# Patient Record
Sex: Male | Born: 1935 | Race: White | Hispanic: No
Health system: Southern US, Community
[De-identification: ages and names within clinical notes are randomized; demographics above are authoritative.]

## PROBLEM LIST (undated history)

## (undated) DIAGNOSIS — I10 Essential (primary) hypertension: Secondary | ICD-10-CM

## (undated) DIAGNOSIS — N200 Calculus of kidney: Secondary | ICD-10-CM

## (undated) DIAGNOSIS — D649 Anemia, unspecified: Secondary | ICD-10-CM

## (undated) DIAGNOSIS — I059 Rheumatic mitral valve disease, unspecified: Secondary | ICD-10-CM

## (undated) DIAGNOSIS — E559 Vitamin D deficiency, unspecified: Secondary | ICD-10-CM

## (undated) DIAGNOSIS — I4891 Unspecified atrial fibrillation: Secondary | ICD-10-CM

## (undated) DIAGNOSIS — N400 Enlarged prostate without lower urinary tract symptoms: Secondary | ICD-10-CM

## (undated) DIAGNOSIS — I493 Ventricular premature depolarization: Secondary | ICD-10-CM

## (undated) DIAGNOSIS — Z7901 Long term (current) use of anticoagulants: Secondary | ICD-10-CM

## (undated) DIAGNOSIS — N184 Chronic kidney disease, stage 4 (severe): Secondary | ICD-10-CM

## (undated) DIAGNOSIS — M1990 Unspecified osteoarthritis, unspecified site: Secondary | ICD-10-CM

## (undated) DIAGNOSIS — C61 Malignant neoplasm of prostate: Secondary | ICD-10-CM

## (undated) DIAGNOSIS — Z9229 Personal history of other drug therapy: Secondary | ICD-10-CM

## (undated) HISTORY — PX: EYE SURGERY: SHX253

## (undated) HISTORY — PX: KIDNEY STONE SURGERY: SHX686

## (undated) HISTORY — PX: SP AV DIALYSIS SHUNT ACCESS EXISTING *L*: HXRAD383

---

## 1998-01-09 ENCOUNTER — Other Ambulatory Visit: Admission: RE | Admit: 1998-01-09 | Discharge: 1998-01-09 | Payer: Self-pay | Admitting: *Deleted

## 2010-02-15 ENCOUNTER — Ambulatory Visit (HOSPITAL_COMMUNITY): Admission: RE | Admit: 2010-02-15 | Discharge: 2010-02-15 | Payer: Self-pay | Admitting: Urology

## 2010-07-28 HISTORY — PX: JOINT REPLACEMENT: SHX530

## 2011-03-31 ENCOUNTER — Other Ambulatory Visit: Payer: Self-pay

## 2011-03-31 ENCOUNTER — Emergency Department (INDEPENDENT_AMBULATORY_CARE_PROVIDER_SITE_OTHER): Payer: Medicare Other

## 2011-03-31 ENCOUNTER — Emergency Department (HOSPITAL_BASED_OUTPATIENT_CLINIC_OR_DEPARTMENT_OTHER)
Admission: EM | Admit: 2011-03-31 | Discharge: 2011-03-31 | Disposition: A | Discharge status: Nursing Home (Includes ICF) | Payer: Medicare Other | Source: Home / Self Care | Attending: Emergency Medicine | Admitting: Emergency Medicine

## 2011-03-31 ENCOUNTER — Inpatient Hospital Stay (HOSPITAL_COMMUNITY): Payer: Medicare Other

## 2011-03-31 ENCOUNTER — Encounter: Payer: Self-pay | Admitting: *Deleted

## 2011-03-31 ENCOUNTER — Inpatient Hospital Stay (HOSPITAL_COMMUNITY)
Admission: AD | Admit: 2011-03-31 | Discharge: 2011-04-04 | DRG: 309 | Disposition: A | Discharge status: Home / Self Care | Payer: Medicare Other | Source: Other Acute Inpatient Hospital | Attending: Internal Medicine | Admitting: Internal Medicine

## 2011-03-31 DIAGNOSIS — R079 Chest pain, unspecified: Secondary | ICD-10-CM

## 2011-03-31 DIAGNOSIS — Z8546 Personal history of malignant neoplasm of prostate: Secondary | ICD-10-CM

## 2011-03-31 DIAGNOSIS — F17200 Nicotine dependence, unspecified, uncomplicated: Secondary | ICD-10-CM | POA: Diagnosis present

## 2011-03-31 DIAGNOSIS — I129 Hypertensive chronic kidney disease with stage 1 through stage 4 chronic kidney disease, or unspecified chronic kidney disease: Secondary | ICD-10-CM | POA: Diagnosis present

## 2011-03-31 DIAGNOSIS — R5383 Other fatigue: Secondary | ICD-10-CM

## 2011-03-31 DIAGNOSIS — Z880 Allergy status to penicillin: Secondary | ICD-10-CM

## 2011-03-31 DIAGNOSIS — N183 Chronic kidney disease, stage 3 unspecified: Secondary | ICD-10-CM | POA: Diagnosis present

## 2011-03-31 DIAGNOSIS — I4891 Unspecified atrial fibrillation: Principal | ICD-10-CM | POA: Diagnosis present

## 2011-03-31 DIAGNOSIS — N179 Acute kidney failure, unspecified: Secondary | ICD-10-CM | POA: Diagnosis present

## 2011-03-31 DIAGNOSIS — E669 Obesity, unspecified: Secondary | ICD-10-CM | POA: Diagnosis present

## 2011-03-31 HISTORY — DX: Unspecified osteoarthritis, unspecified site: M19.90

## 2011-03-31 HISTORY — DX: Calculus of kidney: N20.0

## 2011-03-31 HISTORY — DX: Malignant neoplasm of prostate: C61

## 2011-03-31 HISTORY — DX: Ventricular premature depolarization: I49.3

## 2011-03-31 HISTORY — DX: Essential (primary) hypertension: I10

## 2011-03-31 LAB — CBC
HCT: 34.5 % — ABNORMAL LOW (ref 39.0–52.0)
MCH: 31.7 pg (ref 26.0–34.0)
MCHC: 33.6 g/dL (ref 30.0–36.0)
MCV: 94.3 fL (ref 78.0–100.0)
Platelets: 132 10*3/uL — ABNORMAL LOW (ref 150–400)
RBC: 3.66 MIL/uL — ABNORMAL LOW (ref 4.22–5.81)
RDW: 13.3 % (ref 11.5–15.5)
WBC: 7.6 10*3/uL (ref 4.0–10.5)

## 2011-03-31 LAB — COMPREHENSIVE METABOLIC PANEL
ALT: 7 U/L (ref 0–53)
AST: 14 U/L (ref 0–37)
Alkaline Phosphatase: 60 U/L (ref 39–117)
GFR calc Af Amer: 37 mL/min — ABNORMAL LOW (ref >60)
GFR calc non Af Amer: 30 mL/min — ABNORMAL LOW (ref >60)
Total Bilirubin: 0.3 mg/dL (ref 0.3–1.2)

## 2011-03-31 LAB — CARDIAC PANEL(CRET KIN+CKTOT+MB+TROPI)
CK, MB: 1.7 ng/mL (ref 0.3–4.0)
Relative Index: INVALID (ref 0.0–2.5)
Total CK: 49 U/L (ref 7–232)
Troponin I: <0.3 ng/mL (ref <0.30)

## 2011-03-31 LAB — DIFFERENTIAL
Basophils Absolute: 0 10*3/uL (ref 0.0–0.1)
Basophils Relative: 0 % (ref 0–1)
Eosinophils Absolute: 0.5 10*3/uL (ref 0.0–0.7)
Eosinophils Relative: 6 % — ABNORMAL HIGH (ref 0–5)
Lymphocytes Relative: 19 % (ref 12–46)
Monocytes Relative: 9 % (ref 3–12)
Neutro Abs: 5 10*3/uL (ref 1.7–7.7)

## 2011-03-31 LAB — PRO B NATRIURETIC PEPTIDE: Pro B Natriuretic peptide (BNP): 3398 pg/mL — ABNORMAL HIGH (ref 0–450)

## 2011-03-31 MED ORDER — DILTIAZEM HCL 100 MG IV SOLR
INTRAVENOUS | Status: AC
  Administered 2011-03-31: 100 mg
  Filled 2011-03-31: qty 100

## 2011-03-31 MED ORDER — DILTIAZEM HCL 50 MG/10ML IV SOLN
10.0000 mg | Freq: Once | INTRAVENOUS | Status: DC
  Filled 2011-03-31: qty 2

## 2011-03-31 MED ORDER — DILTIAZEM HCL 100 MG IV SOLR: 5.0000 mg/h | INTRAVENOUS | Status: DC

## 2011-03-31 NOTE — ED Notes (Signed)
EMS reports patient woke up this morning with generalized fatigue and weakness.  Called EMS states patient stated he has an irregular heartbeat.  ST 120.

## 2011-03-31 NOTE — ED Provider Notes (Addendum)
History     CSN: [id number] Arrival date & time: 03/31/2011  9:51 AM  Chief Complaint  Patient presents with  . Weakness   HPI Patient states that he was feeling his usual self this morning.  About a half an hour eating breakfast he began feeling generally weak. He states it is felt like his heart was fluttering when he checked his pulse. He states he took her blood pressure at home and it was low with a systolic blood pressure 70. He called 911 that point. He states that when they arrived his blood pressure was normal. He states he's been feeling improved since that time. He denies any chest pain or shortness of breath. Past Medical History  Diagnosis Date  . Irregular heartbeat   . Gout   . Hypertension   . Prostate ca   . Osteoarthritis     No past surgical history on file.  No family history on file.  History  Substance Use Topics  . Smoking status: Not on file  . Smokeless tobacco: Not on file  . Alcohol Use:       Review of Systems  All other systems reviewed and are negative.    PE  to well-developed well-nourished elderly male sitting in bed who appears pale but in no acute distress. Heart rate is 133 respiratory rate is 20 blood pressure is 113/72 and he is afebrile. HEENT normocephalic atraumatic pupils are equal round and reactive to light extraocular movements are intact external ears are normal with TMs intact bilaterally mouth mucous membranes are slightly dry oropharynx is clear neck reveals no adenopathy and no JVD lungs are clear to auscultation Heart is irregularly irregular abdomen is soft and nontender extremities show no acute abnormality neurologically she is alert and oriented x3.  Physical Exam  BP 113/78  Pulse 133  Temp(Src) 98.5 F (36.9 C) (Oral)  Resp 20  SpO2 98%  Physical Exam  ED Course  Procedures  MDM  Date: 03/31/2011  Rate: 112  Rhythm: atrial fibrillation  QRS Axis: normal  Intervals: normal  ST/T Wave abnormalities:  normal  Conduction Disutrbances:none  Narrative Interpretation:   Old EKG Reviewed: none available   Dg Chest Portable 1 View  03/31/2011  *RADIOLOGY REPORT*  Clinical Data: Chest pain and fatigue.  PORTABLE CHEST - 1 VIEW  Comparison: None.  Findings: Single view of the chest demonstrates clear lungs.  There may be a small amount of atelectasis at the left lung base. The aortic arch and descending thoracic aorta are prominent.  Trachea is midline.  Heart size is within normal limits.  IMPRESSION: No acute chest findings.  Prominent descending thoracic aorta.  Original Report Authenticated By: Richarda Overlie, M.D.  Results for orders placed during the hospital encounter of 03/31/11  CBC      Component Value Range   WBC 7.6  4.0 - 10.5 (K/uL)   RBC 3.66 (*) 4.22 - 5.81 (MIL/uL)   Hemoglobin 11.6 (*) 13.0 - 17.0 (g/dL)   HCT 40.9 (*) 81.1 - 52.0 (%)   MCV 94.3  78.0 - 100.0 (fL)   MCH 31.7  26.0 - 34.0 (pg)   MCHC 33.6  30.0 - 36.0 (g/dL)   RDW 91.4  78.2 - 95.6 (%)   Platelets 132 (*) 150 - 400 (K/uL)  DIFFERENTIAL      Component Value Range   Neutrophils Relative 65  43 - 77 (%)   Neutro Abs 5.0  1.7 - 7.7 (K/uL)   Lymphocytes  Relative 19  12 - 46 (%)   Lymphs Abs 1.5  0.7 - 4.0 (K/uL)   Monocytes Relative 9  3 - 12 (%)   Monocytes Absolute 0.7  0.1 - 1.0 (K/uL)   Eosinophils Relative 6 (*) 0 - 5 (%)   Eosinophils Absolute 0.5  0.0 - 0.7 (K/uL)   Basophils Relative 0  0 - 1 (%)   Basophils Absolute 0.0  0.0 - 0.1 (K/uL)  COMPREHENSIVE METABOLIC PANEL      Component Value Range   Sodium 141  135 - 145 (mEq/L)   Potassium 4.1  3.5 - 5.1 (mEq/L)   Chloride 104  96 - 112 (mEq/L)   CO2 28  19 - 32 (mEq/L)   Glucose, Bld 111 (*) 70 - 99 (mg/dL)   BUN 28 (*) 6 - 23 (mg/dL)   Creatinine, Ser 0.45 (*) 0.50 - 1.35 (mg/dL)   Calcium 40.9 (*) 8.4 - 10.5 (mg/dL)   Total Protein 6.3  6.0 - 8.3 (g/dL)   Albumin 3.6  3.5 - 5.2 (g/dL)   AST 14  0 - 37 (U/L)   ALT 7  0 - 53 (U/L)   Alkaline  Phosphatase 60  39 - 117 (U/L)   Total Bilirubin 0.3  0.3 - 1.2 (mg/dL)   GFR calc non Af Amer 30 (*) >60 (mL/min)   GFR calc Af Amer 37 (*) >60 (mL/min)  PROTIME-INR      Component Value Range   Prothrombin Time 13.7  11.6 - 15.2 (seconds)   INR 1.03  0.00 - 1.49   APTT      Component Value Range   aPTT 28  24 - 37 (seconds)  TROPONIN I      Component Value Range   Troponin I <0.30  <0.30 (ng/mL)   Patient's hospital course involved receiving Cardizem 10 mg IV and he is currently on a Cardizem drip at 5 mg per hour. His heart rate was controlled in the 90s. He has maintained adequate blood pressure.   I spoke with hosptitalist on call and patient will be transferred to their service with the doctor on call for the hospitalists   Hilario Quarry, MD 04/08/11 1116  Hilario Quarry, MD 04/12/11 8119  Hilario Quarry, MD 04/12/11 [phone]

## 2011-03-31 NOTE — ED Notes (Signed)
Attempted to call report to 1414 Carley Hammed, RN at [facility].  Unable to give report at this time.

## 2011-03-31 NOTE — ED Notes (Signed)
Patient states he had a sudden onset of weakness and not feeling good after breakfast.  Attempted to check bp and machine would not work.

## 2011-04-01 ENCOUNTER — Inpatient Hospital Stay (HOSPITAL_COMMUNITY): Payer: Medicare Other

## 2011-04-01 DIAGNOSIS — M79609 Pain in unspecified limb: Secondary | ICD-10-CM

## 2011-04-01 LAB — LIPID PANEL
Cholesterol: 178 mg/dL (ref 0–200)
Total CHOL/HDL Ratio: 4.6 RATIO
VLDL: 28 mg/dL (ref 0–40)

## 2011-04-01 LAB — MAGNESIUM: Magnesium: 1.4 mg/dL — ABNORMAL LOW (ref 1.5–2.5)

## 2011-04-01 LAB — CREATININE, URINE, RANDOM: Creatinine, Urine: 58.6 mg/dL

## 2011-04-01 LAB — PHOSPHORUS: Phosphorus: 3.2 mg/dL (ref 2.3–4.6)

## 2011-04-01 LAB — CBC
MCH: 31.8 pg (ref 26.0–34.0)
Platelets: 130 10*3/uL — ABNORMAL LOW (ref 150–400)
RBC: 3.52 MIL/uL — ABNORMAL LOW (ref 4.22–5.81)
RDW: 13.9 % (ref 11.5–15.5)
WBC: 6.4 10*3/uL (ref 4.0–10.5)

## 2011-04-01 LAB — OCCULT BLOOD X 1 CARD TO LAB, STOOL: Fecal Occult Bld: NEGATIVE

## 2011-04-01 LAB — HEPARIN LEVEL (UNFRACTIONATED)
Heparin Unfractionated: 0.59 IU/mL (ref 0.30–0.70)
Heparin Unfractionated: 0.73 IU/mL — ABNORMAL HIGH (ref 0.30–0.70)

## 2011-04-01 LAB — BASIC METABOLIC PANEL
BUN: 25 mg/dL — ABNORMAL HIGH (ref 6–23)
Calcium: 10.2 mg/dL (ref 8.4–10.5)
Chloride: 105 mEq/L (ref 96–112)
Glucose, Bld: 101 mg/dL — ABNORMAL HIGH (ref 70–99)
Potassium: 3.9 mEq/L (ref 3.5–5.1)

## 2011-04-01 LAB — CARDIAC PANEL(CRET KIN+CKTOT+MB+TROPI)
CK, MB: 1.7 ng/mL (ref 0.3–4.0)
Relative Index: INVALID (ref 0.0–2.5)
Total CK: 56 U/L (ref 7–232)

## 2011-04-01 LAB — IRON AND TIBC
Iron: 49 ug/dL (ref 42–135)
TIBC: 276 ug/dL (ref 215–435)
UIBC: 227 ug/dL (ref 125–400)

## 2011-04-01 LAB — TSH: TSH: 2.684 u[IU]/mL (ref 0.350–4.500)

## 2011-04-01 MED ORDER — TECHNETIUM TO 99M ALBUMIN AGGREGATED
5.5000 | Freq: Once | INTRAVENOUS | Status: AC | PRN
  Administered 2011-04-01: 6 via INTRAVENOUS

## 2011-04-01 MED ORDER — XENON XE 133 GAS
6.0000 | GAS_FOR_INHALATION | Freq: Once | RESPIRATORY_TRACT | Status: AC | PRN
  Administered 2011-04-01: 6 via RESPIRATORY_TRACT

## 2011-04-02 LAB — CBC
Hemoglobin: 10.7 g/dL — ABNORMAL LOW (ref 13.0–17.0)
MCHC: 33.1 g/dL (ref 30.0–36.0)
Platelets: 122 10*3/uL — ABNORMAL LOW (ref 150–400)
RDW: 13.6 % (ref 11.5–15.5)

## 2011-04-02 LAB — HEPARIN LEVEL (UNFRACTIONATED)
Heparin Unfractionated: 0.54 IU/mL (ref 0.30–0.70)
Heparin Unfractionated: 0.42 IU/mL (ref 0.30–0.70)

## 2011-04-02 LAB — BASIC METABOLIC PANEL
BUN: 23 mg/dL (ref 6–23)
Calcium: 10 mg/dL (ref 8.4–10.5)
Creatinine, Ser: 1.8 mg/dL — ABNORMAL HIGH (ref 0.50–1.35)
GFR calc Af Amer: 44 mL/min — ABNORMAL LOW (ref >60)
GFR calc non Af Amer: 36 mL/min — ABNORMAL LOW (ref >60)
Glucose, Bld: 93 mg/dL (ref 70–99)

## 2011-04-02 LAB — PROTIME-INR: Prothrombin Time: 16.3 seconds — ABNORMAL HIGH (ref 11.6–15.2)

## 2011-04-03 LAB — COMPREHENSIVE METABOLIC PANEL
Albumin: 3.5 g/dL (ref 3.5–5.2)
Alkaline Phosphatase: 56 U/L (ref 39–117)
BUN: 25 mg/dL — ABNORMAL HIGH (ref 6–23)
Potassium: 3.7 mEq/L (ref 3.5–5.1)
Sodium: 142 mEq/L (ref 135–145)
Total Protein: 6.2 g/dL (ref 6.0–8.3)

## 2011-04-03 LAB — DIFFERENTIAL
Basophils Absolute: 0 10*3/uL (ref 0.0–0.1)
Basophils Relative: 1 % (ref 0–1)
Eosinophils Absolute: 0.4 10*3/uL (ref 0.0–0.7)
Eosinophils Relative: 7 % — ABNORMAL HIGH (ref 0–5)
Monocytes Absolute: 0.5 10*3/uL (ref 0.1–1.0)

## 2011-04-03 LAB — PROTIME-INR: INR: 1.79 — ABNORMAL HIGH (ref 0.00–1.49)

## 2011-04-03 LAB — CBC
HCT: 33.5 % — ABNORMAL LOW (ref 39.0–52.0)
MCHC: 33.4 g/dL (ref 30.0–36.0)
Platelets: 131 10*3/uL — ABNORMAL LOW (ref 150–400)
RDW: 13.6 % (ref 11.5–15.5)

## 2011-04-03 LAB — APTT: aPTT: 90 seconds — ABNORMAL HIGH (ref 24–37)

## 2011-04-03 LAB — MAGNESIUM: Magnesium: 1.2 mg/dL — ABNORMAL LOW (ref 1.5–2.5)

## 2011-04-03 LAB — HEPARIN LEVEL (UNFRACTIONATED): Heparin Unfractionated: 0.68 IU/mL (ref 0.30–0.70)

## 2011-04-04 LAB — DIFFERENTIAL
Basophils Absolute: 0 10*3/uL (ref 0.0–0.1)
Eosinophils Absolute: 0.4 10*3/uL (ref 0.0–0.7)
Eosinophils Relative: 6 % — ABNORMAL HIGH (ref 0–5)
Lymphocytes Relative: 21 % (ref 12–46)

## 2011-04-04 LAB — PROTIME-INR
INR: 2.37 — ABNORMAL HIGH (ref 0.00–1.49)
Prothrombin Time: 26.3 seconds — ABNORMAL HIGH (ref 11.6–15.2)

## 2011-04-04 LAB — CBC
Platelets: 132 10*3/uL — ABNORMAL LOW (ref 150–400)
RDW: 13.7 % (ref 11.5–15.5)
WBC: 6.8 10*3/uL (ref 4.0–10.5)

## 2011-04-04 LAB — COMPREHENSIVE METABOLIC PANEL
Albumin: 3.5 g/dL (ref 3.5–5.2)
BUN: 25 mg/dL — ABNORMAL HIGH (ref 6–23)
Creatinine, Ser: 1.71 mg/dL — ABNORMAL HIGH (ref 0.50–1.35)
Total Protein: 6.3 g/dL (ref 6.0–8.3)

## 2011-04-04 LAB — APTT: aPTT: 152 seconds — ABNORMAL HIGH (ref 24–37)

## 2011-04-04 LAB — MAGNESIUM: Magnesium: 2 mg/dL (ref 1.5–2.5)

## 2011-04-04 NOTE — Discharge Summary (Signed)
NAME:  Steven [name], Steven [name] NO.:  [id number]  MEDICAL RECORD NO.:  [id number]  LOCATION:  1414                         FACILITY:  [facility]  PHYSICIAN:  Talmage Nap, MD  DATE OF BIRTH:  February 22, 1927  DATE OF ADMISSION:  03/31/2011 DATE OF DISCHARGE:  04/04/2011                        DISCHARGE SUMMARY - REFERRING   PRIMARY CARE PHYSICIAN:  Robert L. Foy Guadalajara, MD, with [facility] Sprang.  CONSULTANTS INVOLVED IN THE CASE:  Cardiology, Georga Hacking, MD.  DISCHARGE DIAGNOSES: 1. Atrial fibrillation status post cardioversion with a defibrillator. 2. Hypertension. 3. Hypomagnesemia - corrected. 4. Obesity. 5. History of gout. 6. Osteoarthritis. 7. History of prostate cancer. 8. Chronic kidney disease stage 3.  PROCEDURE DONE ON PATIENT:  An emergency cardioversion with defibrillator.  The patient is an 77 year old Caucasian male with history of prostate CA who was admitted to the hospital on March 31, 2011, by Dr. Richarda Overlie, with complaint of feeling very tired and weak.  The patient was also said to have noticed irregular heartbeat.  There was no history of loss of consciousness.  There was no history of chest pain.  There was no history of shortness of breath.  No nausea or vomiting.  No fever, no chills, no rigor.  Denies any lightheadedness or syncopal attack.  The patient, however, complained of on and off swelling of the lower extremity, but denied any associated shortness of breath.  At the time the patient presented to the emergency room, he was found to be in atrial fibrillation with rapid ventricular rate and subsequently started on IV Cardizem, and thereafter admitted for stabilization.  PREADMISSION MEDICATIONS WITHOUT DOSAGES: 1. Zestril. 2. Norvasc. 3. Allopurinol. 4. Aspirin. 5. Atenolol/hydrochlorothiazide. 6. Calcium carbonate. 7. Ibuprofen. 8. Hydroxyprogesterone. 9. Remeron.  ALLERGIES:  To PENICILLIN.  SOCIAL HISTORY:  The patient  smokes cigar all his life.  Denies any history of alcohol use; and lives at home with his spouse.  FAMILY HISTORY:  York Spaniel to be positive for gynecological malignancy, suspicities unknown.  Father was said to have died of acute MI at age of 65.  PAST SURGICAL HISTORY:  Surgery type unknown for nephrolithiasis.  REVIEW OF SYSTEMS:  Essentially documented in the initial history and physical.  PHYSICAL EXAMINATION:  VITAL SIGNS:  At the time the patient was seen by the admitting physician, blood pressure 150/77, respiratory rate 18, pulse 88, said to be saturating at 82% on room air. GENERAL:  He was comfortable, not in any respiratory distress. HEENT: Pupils were reactive to light, and extraocular muscles are intact. NECK:  No jugular venous distention.  No carotid bruit.  No lymphadenopathy. CHEST:  Said to have few crackles at the bases. HEART:  Heart sounds irregularly irregular with no murmur. ABDOMEN:  Obese, nontender.  Liver, spleen, kidney, not palpable.  Bowel sounds are positive. EXTREMITIES:  Showed trace edema. NEUROLOGIC:  Nonfocal. MUSCULOSKELETAL SYSTEM:  Showed arthritic changes in the knees and in the feet.  NEUROPSYCHIATRIC:  Evaluation was unremarkable.  LABORATORY DATA:  Initial complete blood count differential showed WBC of 7.6, hemoglobin 11.6, hematocrit 34.5, MCV of 94.3, and platelet count of 132.  Cardiac markers, troponin-I less than 0.30, less than 0.30, and less than  0.30 respectively.  Comprehensive metabolic panel showed sodium of 141, potassium of 4.1, chloride of 104 with a bicarb of 28, glucose is 111, BUN is 20, creatinine is 2.10.  Coagulation profile showed PT 13.7, INR 1.03, and APTT of 28.  ProBNP 3398.  Initial magnesium level done on March 31, 2011 was 1.4, phosphorus level is 3.2.  Hemoglobin A1c is 5.9.  D-dimer is 2.22.  Thyroid panel, TSH 2.684.  Anemia panel showed serum iron 49, total iron binding capacity is 276, percentage  saturation is 18, UIBC is 227.  Vitamin B12 is 237. Lipid panel showed total cholesterol 178, triglyceride 138, HDL 39, LDL 111.  Repeat proBNP done on April 02, 2011 was 2802.  Comprehensive metabolic panel done on April 04, 2011, showed sodium of 141, potassium of 3.9, chloride of 102 with a bicarb of 30.  Glucose is 101, BUN is 25, creatinine is 1.71.  Magnesium level is 2.0.  Complete blood count with differential showed WBC of 6.8, hemoglobin of 12.1, hematocrit of 35.3, MCV of 93.1, platelet count of 132.  Imaging studies done include chest x-ray, on admission showed no acute chest finding.  Ventilation-perfusion scan showed low probability for pulmonary embolism.  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  No evidence of acute intracranial abnormality.  Renal ultrasound showed evidence of chronic medical renal disease with renal cortical thinning and increased echogenicity, and small bilateral simple cyst.  2-D echo done on April 01, 2011, showed moderate-to-severe LVH, EF of 50% to 55%.  HOSPITAL COURSE:  The patient was admitted to telemetry with an impression of new onset atrial fibrillation.  He was started on normal saline to go at rate of 6 cc an hour, and also started on Cardizem drip IV titrated to half-way less than 100.  In addition, the patient was also started on heparin drip per protocol alongside with p.o. Coumadin, and the dosing was done by the pharmacist.  The patient was also started on amiodarone 100 mg p.o. stat, and subsequently 200 mg p.o. t.i.d.  The patient was evaluated by the cardiologist, Dr. Viann Fish, and ultimately the patient had cardioversion done for atrial fibrillation with defibrillator, and the indication for that was failed cardioversion with IV meds.  Post-cardioversion, the patient was continued on IV heparin, as well as Coumadin.  The patient was seen by me for the very first time in this admission on  April 02, 2011, and during this encounter, the patient denied any chest pain.  On examination of patient, vital signs, blood pressure was 190/91, pulse 60, respiratory rate 20.  Chest exam showed rales with rhonchi all over the lung.  At this point, an impression of congestive heart failure, not otherwise specified was made by me, and the patient was started on IV Lasix, as well as Diovan.  However, April 02, 2011, Lasix and Diovan  was discontinued by the cardiologist, and the patient was started on lisinopril 20 mg p.o. b.i.d., and amlodipine was also added to regimen 5 mg p.o. b.i.d.  On April 03, 2011, a review of the patient's electrolytes showed the patient to be hypomagnesemic, and was given mag rider of 2 g, IV Lovenox x2 doses, and to be followed by Mag-Ox 400 mg p.o. b.i.d.  So far, the patient has remained clinically stable.  He was seen by me today, which is April 04, 2011.  Denied any chest pain, denied any shortness of breath.  I had an extensive discussion with Dr.  Viann Fish over the phone, and recommended the patient should be discharged today.  The patient's vital signs today, blood pressure 148/73, temperature 98.0, pulse 65, respiratory rate 18, medically. Examination of the patient did show minimally scattered rhonchi with trace lower extremity edema.  However, it is my opinion that the patient had some nonspecific congestive heart failure, but Cardiology was not agreeable with my impression.  PLAN:  The plan today is for the patient to be discharged home today on activity as tolerated; low-sodium, low-cholesterol diet; daily weight and fluid restriction.  He will be followed by his primary care physician in 1-2 weeks, and by Dr. Donnie Aho, in 1-2 weeks.  Telephone number is [phone].  MEDICATIONS TO BE TAKEN AT HOME: 1. Amiodarone 200 mg p.o. t.i.d. 2. Amlodipine 5 mg p.o. b.i.d. 3. Lisinopril 20 mg p.o. b.i.d. 4. Magnesium oxide 400 mg p.o.  b.i.d. 5. Potassium chloride 10 mEq p.o. b.i.d. 6. Crestor 10 mg p.o. daily at bedtime. 7. Warfarin 5 mg p.o. daily. 8. Allopurinol 100 mg p.o. q.a.m. 9. Aspirin 81 mg p.o. daily. 10.Atenolol/chlorthalidone 100/25 one p.o. daily. 11.Calcium carbonate/Vitamin D 1 tablet p.o. b.i.d. 12.Ibuprofen 200 mg 1 to 2 tablets p.o. q.6 p.r.n. for pain. 13.Medroxyprogesterone acetate 5 mg 1 p.o. b.i.d. p.r.n. 14.Mirtazapine 5 mg 1 p.o. daily at bedtime.    A copy of this discharge summary will be made available to the patient's primary care physician     Talmage Nap, MD     CN/MEDQ  D:  04/04/2011  T:  04/04/2011  Job:  [id number]  cc:   Molly Maduro L. Foy Guadalajara, M.D. Fax: [phone]  Electronically Signed by Talmage Nap  on 04/04/2011 06:55:12 PM

## 2011-04-07 NOTE — Consult Note (Signed)
NAME:  Steven [name], Steven [name] NO.:  [id number]  MEDICAL RECORD NO.:  [id number]  LOCATION:  1414                         FACILITY:  [facility]  PHYSICIAN:  Georga Hacking, M.D.DATE OF BIRTH:  Mar 20, 1927  DATE OF CONSULTATION:  03/31/2011                                CONSULTATION   REASON FOR CONSULTATION:  Atrial fibrillation new onset.  HISTORY:  The patient is a very nice 77 year old white male who is consulted on for atrial fibrillation.  The patient has no previous cardiac history but has a history of hypertension, gout, nephrolithiasis, and prostate cancer treated with hormonal therapy.  He has had longstanding hypertension.  He has an elderly wife who is ill and has been taking care of her.  He awoke this morning but had the onset of weakness, did not feel well and felt an irregular pulse and was taken to the [facility].  He was found to be in rapid atrial fibrillation there and was transferred to [facility].  He is on a low-dose of a diltiazem drip at the present time and heparin is in the process of being started.  He has no history of chest pain, pressure tightness or heaviness and complains only of weakness, did not have significant shortness of breath.  He has longstanding hypertension.  Denies PND, orthopnea or edema.  PAST MEDICAL HISTORY: 1. Prostate cancer treated with Lupron injections. 2. Hypertension. 3. Gout. 4. Nephrolithiasis.  PAST SURGICAL HISTORY:  Removal of kidney stone.  ALLERGIES:  PENICILLIN.  CURRENT MEDICATIONS: 1. Calcium carbonate daily. 2. Medroxyprogesterone 5 mg daily. 3. Allopurinol 100 mg daily. 4. Mirtazapine 15 mg at bedtime. 5. Zestril 10 mg daily. 6. Aspirin 81 mg daily. 7. Amlodipine 5 mg daily. 8. Atenolol. 9. Chlorthalidone 100/25 daily. 10.Ibuprofen as needed.  FAMILY HISTORY:  Father died of MI at age 69.  Mother died at age 57 of ovarian cancer.  Three brothers, one  brother died at age 54 of MI.  SOCIAL HISTORY:  He is a retired Games developer.  He had 1 son who died of pancreatic cancer several years ago.  He smokes occasional cigars but has never smoked cigarettes.  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  REVIEW OF SYSTEMS:  He has had hot flashes.  He has had previous cataract extraction.  He has no difficulty swallowing, no diarrhea, hematochezia or GI bleeding.  He had previous urgency, frequency and hesitancy as well as nocturia and continues to have nocturia.  He has significant arthritis involving his left knee, takes ibuprofen for this and is contemplating to be needing a knee replacement.  Other than as noted above, the remainder of the review of systems is unremarkable.  PHYSICAL EXAMINATION:  GENERAL:  He is a very pleasant elderly male who appears slightly younger than stated age. VITAL SIGNS:  Blood pressure is currently 153/77, pulse 88 and irregular. SKIN:  Warm and dry. ENT:  EOMI.  PERRLA.  CNS clear.  Fundi not examined.  Pharynx negative. NECK:  Supple.  No masses, JVD, thyromegaly or bruits. LUNGS:  Clear to auscultation and percussion. CARDIAC:  Irregular rhythm, normal  S1, S2.  No S3-S4. ABDOMEN:  Soft, nontender.  No mass, hepatosplenomegaly or aneurysm. Femoral pulses 2+, distal pulses 2+, 1+ edema is noted. NEUROLOGIC:  Normal cranial nerves.  Sensory and motor intact. GU AND RECTAL:  Deferred due to cardiac condition.  LABORATORY DATA:  Hemoglobin 11.6, hematocrit 34.5, platelet count 132,000, eosinophils 6%.  Sodium is 141, potassium 4.1, chloride 104, CO2 28, BUN 28, creatinine 2.1, glucose 111, calcium is 10.6.  Troponin is less than 0.3.  EKG shows atrial fibrillation with rapid ventricular response.  Chest x-ray shows clear lung fields.  IMPRESSION: 1. New onset of atrial fibrillation less than 24 hours duration.  [place]     score is 2. 2. Hypertensive heart disease. 3. Prostate cancer  treated with hormonal therapy. 4. History of kidney stones. 5. Stage III to IV chronic kidney disease. 6. History of gout. 7. Osteoarthritis.  RECOMMENDATIONS:  The patient is not that symptomatic and continues to have increased response of atrial fibrillation.  Recommend that he have a higher dose of diltiazem.  He will be placed on intravenous heparin, go ahead and initiate warfarin therapy which he will need because of this [place] score of 2.  Consider early cardioversion.  I am going to go ahead and start him on some amiodarone pending his TSH and his echocardiogram.  May consider early cardioversion if he fails to convert.    Georga Hacking, M.D.    WST/MEDQ  D:  03/31/2011  T:  03/31/2011  Job:  [id number]  cc:   Molly Maduro L. Foy Guadalajara, M.D.  Electronically Signed by Lacretia Nicks. Donnie Aho M.D. on 04/07/2011 03:52:14 PM

## 2011-04-07 NOTE — Op Note (Signed)
  NAME:  Steven [name], Steven [name] NO.:  [id number]  MEDICAL RECORD NO.:  [id number]  LOCATION:  1414                         FACILITY:  [facility]  PHYSICIAN:  Georga Hacking, M.D.DATE OF BIRTH:  1926/12/18  DATE OF PROCEDURE: 04/01/2011                              CARDIOVERSION REPORT   HISTORY:  An 77 year old male with new onset of atrial fibrillation.  He has been placed on heparin, failed to convert on amiodarone and duration of atrial fibrillation is less than 24 hours.  PROCEDURE:  Cardioversion of atrial fibrillation.  DESCRIPTION OF PROCEDURE:  The patient was [facility] and anesthesia was administered by Dr. Okey Dupre with 90 mg of propofol and 0.5 mg of Versed IV.  Cardioversion was done using synchronized biphasic defibrillation with anterior posterior patches with 100 watts, resulting in reversion to sinus rhythm.  He tolerated the procedure well.  IMPRESSION:  Successful cardioversion of atrial fibrillation.  RECOMMENDATIONS:  Warfarin, INR 2-3.  Continue amiodarone for now. Continue heparin for now.     Georga Hacking, M.D.     WST/MEDQ  D:  04/01/2011  T:  04/01/2011  Job:  [id number]  cc:   Molly Maduro L. Foy Guadalajara, M.D.  Electronically Signed by Lacretia Nicks. Donnie Aho M.D. on 04/07/2011 03:52:41 PM

## 2011-04-24 ENCOUNTER — Other Ambulatory Visit: Payer: Self-pay | Admitting: Orthopedic Surgery

## 2011-04-24 ENCOUNTER — Encounter (HOSPITAL_COMMUNITY)
Admission: RE | Admit: 2011-04-24 | Discharge: 2011-04-24 | Disposition: A | Discharge status: Home / Self Care | Payer: Medicare Other | Source: Ambulatory Visit | Attending: Orthopedic Surgery | Admitting: Orthopedic Surgery

## 2011-04-24 LAB — PROTIME-INR
INR: 2.82 — ABNORMAL HIGH (ref 0.00–1.49)
Prothrombin Time: 30.1 seconds — ABNORMAL HIGH (ref 11.6–15.2)

## 2011-04-24 LAB — CBC
HCT: 35.7 % — ABNORMAL LOW (ref 39.0–52.0)
RDW: 13.7 % (ref 11.5–15.5)
WBC: 8.6 10*3/uL (ref 4.0–10.5)

## 2011-04-24 LAB — DIFFERENTIAL
Basophils Absolute: 0 10*3/uL (ref 0.0–0.1)
Basophils Relative: 1 % (ref 0–1)
Eosinophils Relative: 8 % — ABNORMAL HIGH (ref 0–5)
Lymphocytes Relative: 24 % (ref 12–46)
Neutro Abs: 5.2 10*3/uL (ref 1.7–7.7)

## 2011-04-24 LAB — BASIC METABOLIC PANEL
Calcium: 10.9 mg/dL — ABNORMAL HIGH (ref 8.4–10.5)
Chloride: 102 mEq/L (ref 96–112)
Creatinine, Ser: 2.75 mg/dL — ABNORMAL HIGH (ref 0.50–1.35)
GFR calc Af Amer: 27 mL/min — ABNORMAL LOW (ref >60)
GFR calc non Af Amer: 22 mL/min — ABNORMAL LOW (ref >60)

## 2011-04-24 LAB — URINALYSIS, ROUTINE W REFLEX MICROSCOPIC
Ketones, ur: NEGATIVE mg/dL
Leukocytes, UA: NEGATIVE
Nitrite: NEGATIVE
Specific Gravity, Urine: 1.016 (ref 1.005–1.030)
pH: 7 (ref 5.0–8.0)

## 2011-04-24 LAB — APTT: aPTT: 41 seconds — ABNORMAL HIGH (ref 24–37)

## 2011-04-24 LAB — SURGICAL PCR SCREEN: MRSA, PCR: NEGATIVE

## 2011-04-28 NOTE — H&P (Signed)
NAME:  Steven [name], Steven [name] NO.:  [id number]  MEDICAL RECORD NO.:  [id number]  LOCATION:  1S                           FACILITY:  [facility]  PHYSICIAN:  Madlyn Frankel. Charlann Boxer, M.D.  DATE OF BIRTH:  1927-06-05  DATE OF ADMISSION:  04/10/2011 DATE OF DISCHARGE:  01/23/2011                             HISTORY & PHYSICAL   CHIEF COMPLAINT:  Left knee osteoarthritis.  HISTORY OF PRESENT ILLNESS:  The patient is an 82-year white male, in no acute distress.  The patient does state he has had left knee pain for about 10-12 years.  Pain has progressively worsened throughout the years.  The patient has been on anti-inflammatories at one point, but could no longer take these due to being on Coumadin for atrial fibrillation.  The patient has also had cortisone injections, these used to work for quite some time, gradually lessening the duration of effectiveness and the last shots he got did not help at all.  X-rays in the clinic do show significant degenerative arthritic changes of bilateral knees, left worse than right with end-stage osteoarthritis. Various options were discussed with the patient.  The patient wished to proceed with surgery.  Risks, benefits, and expectations of procedure were discussed with the patient.  The patient understands risks, benefits, and expectations and wished to proceed with a left total knee arthroplasty.  The patient is planning on going home after being discharged for the hospital.  The patient was given his postoperative medications of Robaxin, iron, Colace, and MiraLax.  The patient is also going to go back on Coumadin postoperatively.  The patient is not a candidate for tranexamic acid and will not be given this prior to surgery.  The patient's cardiologist is W. Ashley Royalty., MD, who has cleared the patient for surgery.  The patient also sees Dr. Annabell Howells, who has also cleared the patient for surgery.  PAST MEDICAL HISTORY: 1. High blood  pressure. 2. Hypercholesterolemia. 3. Atrial fibrillation. 4. Prostate disease. 5. Gout. 6. Arthritis. 7. Dentures.  PAST SURGICAL HISTORY: 1. Kidney stone removed, 1990. 2. Cataract surgery, bilateral eyes, 2010.  MEDICATIONS: 1. Calcium carbonate 500 mg capsules 1 p.o. daily. 2. Medroxyprogesterone 5 mg 1 p.o. daily. 3. Allopurinol 100 mg 1 p.o. daily. 4. Mirtazapine 15 mg q.h.s. 5. Aspirin 81 mg 1 p.o. daily. 6. Amlodipine 5 mg 1 p.o. daily. 7. Lisinopril 20 mg 1 p.o. b.i.d. 8. Crestor 10 mg 1 p.o. daily. 9. Atenolol/chlorthalidone 100/25 mg 1 p.o. daily. 10.Klor-Con 10 mEq 2 p.o. daily. 11.Uro-Mag 84.5 mg capsules 2 p.o. daily 12.Coumadin 5 mg. 13.Amiodarone 200 mg 1 p.o. daily.  ALLERGIES:  ASPIRIN.  SOCIAL HISTORY:  The patient does admit to smoking occasional cigar. The patient does deny the use of alcohol.  REVIEW OF SYSTEMS:  Unremarkable other than left knee pain.  PHYSICAL EXAMINATION:  GENERAL:  The patient is an 84-year white male, in no acute distress. VITAL SIGNS:  Stable.  Blood pressure is 124/78, pulse 88, and respirations 18. HEENT:  Pupils equal, round, and reactive to light and accommodation. Throat is clear. NECK:  Supple.  No JVD.  No carotid bruits.  No lymphadenopathy noted. CARDIAC:  The  patient does have an irregular heart rate, which is 94. RESPIRATORY:  Lungs are clear to auscultation bilaterally. NEUROLOGIC:  The patient is alert and oriented x3 with those pertaining to left knee.  The patient has no pain on palpation over the entirety of the knee.  The patient does have extension to about 10 degrees, flexion to about 90 degrees.  The patient does have varus deformity to his knee. The patient distally neurovascularly intact.  +1 dorsalis pedis pulse. Surgical site is clear.  IMPRESSION:  Left knee osteoarthritis.  STUDIES:  X-rays as above.  PLAN:  The patient will be admitted to hospital to undergo left total knee replacement per  Dr. Charlann Boxer at [facility] on April 29, 2011.  Risks, benefits, and expectation of procedure were discussed with the patient.  The patient understands risks, benefits, and expectations and wished to proceed with surgery.    ______________________________ Lanney Gins, PA   ______________________________ Madlyn Frankel. Charlann Boxer, M.D.    MB/MEDQ  D:  04/23/2011  T:  04/23/2011  Job:  [id number]  Electronically Signed by Lanney Gins PA on 04/26/2011 02:43:30 AM Electronically Signed by Durene Romans M.D. on 04/28/2011 09:08:33 AM

## 2011-04-29 ENCOUNTER — Inpatient Hospital Stay (HOSPITAL_COMMUNITY)
Admission: RE | Admit: 2011-04-29 | Discharge: 2011-05-02 | DRG: 470 | Disposition: A | Discharge status: Home Health | Payer: Medicare Other | Source: Ambulatory Visit | Attending: Orthopedic Surgery | Admitting: Orthopedic Surgery

## 2011-04-29 DIAGNOSIS — M21169 Varus deformity, not elsewhere classified, unspecified knee: Secondary | ICD-10-CM | POA: Diagnosis present

## 2011-04-29 DIAGNOSIS — Z7901 Long term (current) use of anticoagulants: Secondary | ICD-10-CM

## 2011-04-29 DIAGNOSIS — M1710 Unilateral primary osteoarthritis, unspecified knee: Principal | ICD-10-CM | POA: Diagnosis present

## 2011-04-29 DIAGNOSIS — Z01812 Encounter for preprocedural laboratory examination: Secondary | ICD-10-CM

## 2011-04-29 DIAGNOSIS — Z7982 Long term (current) use of aspirin: Secondary | ICD-10-CM

## 2011-04-29 DIAGNOSIS — Z79899 Other long term (current) drug therapy: Secondary | ICD-10-CM

## 2011-04-29 DIAGNOSIS — M109 Gout, unspecified: Secondary | ICD-10-CM | POA: Diagnosis present

## 2011-04-29 DIAGNOSIS — Z8546 Personal history of malignant neoplasm of prostate: Secondary | ICD-10-CM

## 2011-04-29 DIAGNOSIS — I4891 Unspecified atrial fibrillation: Secondary | ICD-10-CM | POA: Diagnosis present

## 2011-04-29 DIAGNOSIS — I10 Essential (primary) hypertension: Secondary | ICD-10-CM | POA: Diagnosis present

## 2011-04-29 DIAGNOSIS — E7800 Pure hypercholesterolemia, unspecified: Secondary | ICD-10-CM | POA: Diagnosis present

## 2011-04-29 DIAGNOSIS — K08109 Complete loss of teeth, unspecified cause, unspecified class: Secondary | ICD-10-CM | POA: Diagnosis present

## 2011-04-29 DIAGNOSIS — D62 Acute posthemorrhagic anemia: Secondary | ICD-10-CM | POA: Diagnosis not present

## 2011-04-29 LAB — PROTIME-INR: INR: 1.29 (ref 0.00–1.49)

## 2011-04-29 LAB — APTT: aPTT: 27 seconds (ref 24–37)

## 2011-04-29 LAB — ABO/RH: ABO/RH(D): O POS

## 2011-04-30 LAB — PROTIME-INR
INR: 1.34 (ref 0.00–1.49)
Prothrombin Time: 16.8 seconds — ABNORMAL HIGH (ref 11.6–15.2)

## 2011-04-30 LAB — CBC
Hemoglobin: 8.2 g/dL — ABNORMAL LOW (ref 13.0–17.0)
MCH: 31.9 pg (ref 26.0–34.0)
MCV: 96.1 fL (ref 78.0–100.0)
Platelets: 99 10*3/uL — ABNORMAL LOW (ref 150–400)
RBC: 2.57 MIL/uL — ABNORMAL LOW (ref 4.22–5.81)
WBC: 7.8 10*3/uL (ref 4.0–10.5)

## 2011-04-30 LAB — BASIC METABOLIC PANEL
CO2: 23 mEq/L (ref 19–32)
Calcium: 9.8 mg/dL (ref 8.4–10.5)
Chloride: 110 mEq/L (ref 96–112)
Creatinine, Ser: 2.22 mg/dL — ABNORMAL HIGH (ref 0.50–1.35)
Glucose, Bld: 106 mg/dL — ABNORMAL HIGH (ref 70–99)

## 2011-05-01 LAB — CBC
HCT: 22.2 % — ABNORMAL LOW (ref 39.0–52.0)
MCH: 32 pg (ref 26.0–34.0)
MCV: 96.1 fL (ref 78.0–100.0)
Platelets: 83 10*3/uL — ABNORMAL LOW (ref 150–400)
RBC: 2.31 MIL/uL — ABNORMAL LOW (ref 4.22–5.81)
WBC: 7.9 10*3/uL (ref 4.0–10.5)

## 2011-05-01 LAB — BASIC METABOLIC PANEL
BUN: 30 mg/dL — ABNORMAL HIGH (ref 6–23)
CO2: 23 mEq/L (ref 19–32)
Glucose, Bld: 112 mg/dL — ABNORMAL HIGH (ref 70–99)
Potassium: 4.4 mEq/L (ref 3.5–5.1)
Sodium: 138 mEq/L (ref 135–145)

## 2011-05-02 LAB — PROTIME-INR: INR: 1.93 — ABNORMAL HIGH (ref 0.00–1.49)

## 2011-05-05 NOTE — H&P (Signed)
NAME:  Steven [name], ACHORD NO.:  [id number]  MEDICAL RECORD NO.:  [id number]  LOCATION:  1414                         FACILITY:  [facility]  PHYSICIAN:  Richarda Overlie, MD       DATE OF BIRTH:  May 06, 1927  DATE OF ADMISSION:  03/31/2011 DATE OF DISCHARGE:                             HISTORY & PHYSICAL   PRIMARY CARE PHYSICIAN:  Marinda Elk, MD, with [facility].  CHIEF COMPLAINT:  Feeling tired.  SUBJECTIVE:  This is an 77 year old male with a history of hypertension, history of prostate cancer, currently on hormonal therapy for prostate cancer, who woke up this morning feeling tired and fatigued.  He checked his pulse and found himself to have an irregular heart beat.  When EMS found him, he had a heart rate of about 120 and was found in atrial fibrillation.  He does have a history of irregular heartbeat but the patient recalls no history of atrial fibrillation in the past.  The patient states that he has not had any chest pain, shortness of breath or any other cardiopulmonary symptoms.  He has occasionally noticed swelling in his legs in both his lower extremities but over the last couple of days the swelling has gone away.  He denies any lightheadedness, syncope or near syncopal episodes or any falls in the recent past.  The patient has otherwise been in good health.  The patient was found to be in atrial fibrillation with rapid ventricular response at [facility] and was treated with a bolus dose of IV Cardizem, with mild improvement in his heart rate and was subsequently started on a Cardizem drip and transferred to [facility] for further evaluation.  The patient currently is chest pain free.  His EKG confirms atrial fibrillation.  PAST MEDICAL HISTORY:  History of prostate cancer, hypertension, osteoarthritis, gout, and nephrolithiasis.  PAST SURGICAL HISTORY:  Surgery for nephrolithiasis.  SOCIAL HISTORY:  The patient has smoked cigars  all his life.  Denies use of alcohol.  Lives with his wife and takes care of his wife.  FAMILY HISTORY:  Mother died of gynecologic cancer.  Father died of heart attack at the age of 27.  REVIEW OF SYSTEMS:  A 10-point review of systems was done as documented in the HPI.  ALLERGIES:  PENICILLIN causes itching.  HOME MEDICATIONS:  Zestril, Norvasc, allopurinol, aspirin, atenolol, hydrochlorothiazide, calcium carbonate, ibuprofen, hydroxyprogesterone, and Remeron.  PHYSICAL EXAMINATION:  VITAL SIGNS:  Blood pressure 153/77, respirations 18, pulse 88, oxygen saturation 98% on 2 L. GENERAL:  Comfortable, currently in no acute cardiopulmonary distress. HEENT:  Pupils equal and reactive.  Extraocular movements intact. NECK:  Supple.  No JVD. LUNGS:  Occasional crackles at the bases. CARDIOVASCULAR:  Irregularly irregular.  No appreciable murmurs, rubs or gallops. ABDOMEN:  Obese, soft, nontender, nondistended. EXTREMITIES:  Trace pedal edema bilateral lower extremities. NEUROLOGIC:  Cranial nerves II-XII grossly intact. PSYCHIATRIC:  Appropriate mood and affect.  PERTINENT LABORATORY AND X-RAY DATA:  Chest x-ray shows no acute chest findings, prominent descending thoracic aorta. INR of 1.03.  Comprehensive metabolic panel:  Sodium 141, potassium 4.1, chloride 104, bicarb 28, glucose 111, BUN 28, creatinine 2.10.  Total bilirubin 0.3, alk phos 60, AST 14, ALT 7, calcium is 10.6.  Troponin less than 0.03.  CBC:  WBC 7.6, hemoglobin 11.6, hematocrit 34.5, and platelet count of 132. INR of 1.03. EKG shows atrial fibrillation with a rate of 112 beats per minute.  ASSESSMENT: 1. New-onset atrial fibrillation likely less than 48 hours in     duration. 2. Acute on chronic renal insufficiency. 3. Hypertension. 4. Acute kidney injury could be secondary to non-steroidal anti-     inflammatory drugs versus angiotensin-converting enzyme inhibitors.  PLAN:  The patient will be admitted to  telemetry.  We will titrate the Cardizem drip for rate control.  The patient is still symptomatic and is still in atrial fibrillation.  His CHADS2 score is about 2.  We will start him on a heparin drip per Pharmacy protocol and he would need long- term anticoagulation.  He does not have any history of prior GI bleeding.  He has no history of falls.  He is somewhat anemic and has been taking ibuprofen for his osteoarthritis.  Therefore we will check his stool guaiacs to make sure that he is not losing blood in his GI tract and we will order an anemia panel to complete the workup.  I have also consulted Dr. Donnie Aho to evaluate the patient to see if he is a candidate for cardioversion.  If the patient continues to be symptomatic, we will also order a 2-D echo.  In the setting of his history of prostate cancer, we will check a stat D-dimer.  If elevated, will do a CT angio of the chest to rule out a PE, and a Doppler of bilateral lower extremities to rule out a DVT.  He is a full code.     Richarda Overlie, MD     NA/MEDQ  D:  03/31/2011  T:  03/31/2011  Job:  [id number]  Electronically Signed by Richarda Overlie MD on 05/05/2011 01:59:09 PM

## 2011-05-05 NOTE — Op Note (Signed)
NAMEMarland Kitchen  LAIKEN, NOHR NO.:  [id number]  MEDICAL RECORD NO.:  [id number]  LOCATION:  1609                         FACILITY:  [facility]  PHYSICIAN:  Madlyn Frankel. Charlann Boxer, M.D.  DATE OF BIRTH:  Nov 05, 1926  DATE OF PROCEDURE:  04/29/2011 DATE OF DISCHARGE:                              OPERATIVE REPORT   PREOPERATIVE DIAGNOSIS:  Left knee osteoarthritis.  POSTOPERATIVE DIAGNOSIS:  Left knee osteoarthritis.  PROCEDURE:  Left total knee replacement utilizing DePuy rotating platform posterior stabilized knee system size 5 femur, 5 tibia, 12.5 insert, and 41 patellar button.  SURGEON:  Madlyn Frankel. Charlann Boxer, MD  ASSISTANT:  Jaquelyn Bitter. Chabon, PA  Please note that Jodene Nam was present for the entirety of the case and utilized for preoperative positioning, perioperative retractor management and general facilitation of the case as well as primary wound closure.  ANESTHESIA:  Spinal.  SPECIMENS:  None.  COMPLICATIONS:  None.  DRAINS:  One medium Hemovac.  TOURNIQUET TIME:  Per anesthetic record.  COMPLICATIONS:  None apparent.  INDICATION FOR PROCEDURE:  Mr. Muccio is an 77 year old gentleman who presented to the office for left knee pain.  Radiographs revealed end- stage bone-on-bone changes.  He had failed conservative measures and despite his age, was ready to proceed with more definitive measures as his quality of life was affected by his pain.  After reviewing risks and benefits of the procedure, he at this point was ready to proceed.  Risks of infection, DVT, component failure, need for revision surgery for any reason, postoperative course and expectations were discussed and medical clearance obtained.  PROCEDURE IN DETAIL:  The patient was brought to the operative theater. Once adequate anesthesia and preoperative antibiotics administered, the patient was positioned supine with a left thigh tourniquet placed.  The left lower extremity was then prepped  and draped in the sterile fashion. A time-out was performed identifying the patient, plan of procedure, and extremity.  Leg was placed in the [facility] leg holder.  A midline incision was made followed by median parapatellar arthrotomy following initial exposure and debridement.  Attention was first directed to patella.  Precut measurement was noted to be 27 mm, dissected down to about 15 mm and a 41 patellar button was used to cover the cut surface and as well as to restore patellar height.  Lug holes were drilled and a metal shim placed to protect the patella from traction and saw blades.  At this point, the femoral canal was opened with a drill, irrigated to try to prevent fat emboli.  An intramedullary rod was passed at 5 degrees of valgus, 10 mm of bone resected off the distal femur .  The tibia was then subluxated anteriorly and using extramedullary guide, a measured resection was removed off this proximal tibia.  We confirmed the gap would be stable, at least 10 mm insert was stable, medial and lateral ligaments.  At this point, I confirmed the cut was perpendicular in the coronal plane.  Sized the femur to be a size 5.  The size 5 rotation block was pinned into position, anterior reference using a C clamp off the proximal tibia, set rotation.  The 4-in-1 cutting block  was then pinned in position.  Anterior, posterior, and chamfer cuts were made without difficulty nor notching.  Final box cut was made off the lateral aspect in distal femur.  Following this resection, the tibia subluxated anteriorly.  A size 5 tibial tray fit best on the cut surface.  It was pinned into position through the medial third tubercle, drilled and keel punch.  A trial reduction was then carried out with 5 femur, 5 tibia, 12.5 insert allowing the knee to come into full extension, was stable from extension to flexion.  Given these findings, the trial components were removed from the synovial capsule  junction.  Knee was injected with 0.25% Marcaine with epinephrine and 1 cc of Toradol.  Final components were opened, cement was mixed, and the final closure was cemented onto clean and dried cut surfaces of bone.  The knee was brought into extension with a 12.5 insert and extruded cement was removed.  Once the cement had fully cured and excessive cement was removed throughout the knee, the final 12.5 insert was chosen.  The tourniquet had been let down with no significant hemostasis required.  The knee was re-irrigated with normal saline solution.  A medium Hemovac drain was placed deep.  The extensor mechanism was then reapproximated with #1 Vicryl with the knee in flexion.  The remainder of the wound was closed with 2-0 Vicryl and running 4-0 Monocryl.  The knee was cleaned, dried, and dressed sterilely using Dermabond and Aquacel dressing.  Drain site dressed separately, Ace wrap was loosely applied.  The patient was then brought to recovery room in stable condition tolerating the procedure well.     Madlyn Frankel Charlann Boxer, M.D.     MDO/MEDQ  D:  05/01/2011  T:  05/01/2011  Job:  [id number]  Electronically Signed by Durene Romans M.D. on 05/05/2011 01:31:10 PM

## 2011-05-06 NOTE — Discharge Summary (Signed)
  NAMEMarland Kitchen  Steven, [name] NO.:  [id number]  MEDICAL RECORD NO.:  [id number]  LOCATION:  1609                         FACILITY:  [facility]  PHYSICIAN:  Madlyn Frankel. Charlann Boxer, M.D.  DATE OF BIRTH:  January 21, 1927  DATE OF ADMISSION:  04/29/2011 DATE OF DISCHARGE:  05/02/2011                              DISCHARGE SUMMARY   ADDENDUM:  After having physical therapy, the first time on May 01, 2011, the patient did not do so well with range of motion.  The patient did much better during the afternoon session; however, physical therapy did not feel it would be safe for him to be discharged home.  The patient stayed over an extra night.  On evaluation in the morning, the patient felt that he is doing a whole lot better today and is wanting to go home.  The patient will have physical therapy and will be discharged home.    ______________________________ Lanney Gins, PA   ______________________________ Madlyn Frankel. Charlann Boxer, M.D.    MB/MEDQ  D:  05/02/2011  T:  05/02/2011  Job:  [id number]  Electronically Signed by Durene Romans M.D. on 05/06/2011 04:14:33 PM

## 2011-05-06 NOTE — Discharge Summary (Signed)
NAMEMarland Kitchen  KYM, FENTER NO.:  [id number]  MEDICAL RECORD NO.:  [id number]  LOCATION:  1609                         FACILITY:  [facility]  PHYSICIAN:  Madlyn Frankel. Charlann Boxer, M.D.  DATE OF BIRTH:  1927/03/09  DATE OF ADMISSION:  04/29/2011 DATE OF DISCHARGE:  05/01/2011                              DISCHARGE SUMMARY   PROCEDURE:  Left total knee arthroplasty.  ADMITTING DIAGNOSIS:  Left knee osteoarthritis.  DISCHARGE DIAGNOSES: 1. Status post left total knee arthroplasty. 2. High blood pressure. 3. Hypercholesterolemia. 4. Atrial fibrillation. 5. Prostate disease. 6. Gout. 7. Arthritis. 8. Acute blood loss anemia, asymptomatic.  HISTORY OF PRESENT ILLNESS:  The patient is an 77 year old white male, in no acute distress.  The patient does state he has left knee pain for 10-12 years.  Pain has progressively worsened throughout the years.  The patient has been on antiinflammatories at one point, but could no longer take these due to being on Coumadin for atrial fibrillation.  The patient has also had cortisone injections.  They used to work for quite some time, gradually lessened the duration of effectiveness and the last shot did not help him at all.  X-rays in the clinic do show significant degenerative arthritic changes of the bilateral knees, left worse than the right with end-stage osteoarthritis.  Various options were discussed with the patient.  The patient wished to proceed with surgery.  Risks, benefits, and expectations of the procedure were discussed with the patient.  The patient understands the risks, benefits, and expectations, and wished to proceed with left total knee arthroplasty by Dr. Charlann Boxer.  HOSPITAL COURSE:  The patient underwent the above-stated procedure on April 29, 2011.  The patient tolerated the procedure well, was brought to the recovery room in good condition and subsequently to the floor.  Postop day #1, April 30, 2011, the patient  doing well, no events.  Pain well-controlled.  He is afebrile, vital signs stable.  Hemoglobin and hematocrit is 8.2/24.7.  Dressings good, clean, dry and intact.  He is distally neurovascularly intact.  The patient had physical therapy.  Postop day #2, May 01, 2011, the patient is doing well, no events. Pain is well-controlled.  He is afebrile, vital signs stable. Hemoglobin and hematocrit is 7.4/22.2.  Last, patient is asymptomatic, not feeling run down.  At this point, we will treat him with iron to help level up his blood supply.  No transfusions needed at this time. The patient is distally neurovascularly intact.  His dressing is good, clean, dry and intact.  The patient will have physical therapy.  The patient was felt to be doing well enough to be discharged home with home health PT and home health RN.  DISCHARGE CONDITION:  Good.  DISCHARGE INSTRUCTIONS:  The patient will be discharged home with home health PT and home health RN to monitor with Coumadin and diabetes.  The patient will maintain a surgical dressing for about 8 days, after which time, he will replace with gauze and tape.  The patient should keep the area dry and clean until followup.  The patient will follow up in 2 weeks at [facility].  The patient is to call  with any questions or concerns.  DISCHARGE MEDICATIONS: 1. Benadryl 25 mg 1 p.o. q.4 hours p.r.n. 2. Colace 100 mg 1 p.o. b.i.d., constipation. 3. Iron sulfate 325 mg 1 p.o. t.i.d. for 2 to 3 weeks. 4. Norco 7.5/325 one to two p.o. q.4-6 hours p.r.n. pain. 5. Robaxin 500 mg 1 p.o. q.6 hours p.r.n., muscle spasms. 6. MiraLax 17 g p.o. daily, constipation. 7. Allopurinol 100 mg 2 p.o. q.a.m. 8. Aspirin 81 mg 1 p.o. q.h.s. 9. Atenolol/chlorthalidone 100/25 mg 1 tab p.o. q.a.m. 10.Amiodarone 100 mg 1 p.o. q.a.m. 11.Amlodipine 5 mg 1 p.o. b.i.d. 12.Calcium carbonate/vitamin D 1 tablet b.i.d. 13.Klor-Con 10 mEq 1 p.o. b.i.d. 14.Lisinopril 20  mg 1 p.o. b.i.d. 15.Medroxyprogesterone acetate 5 mg 1 p.o. b.i.d. p.r.n., hot flashes. 16.Mirtazapine 50 mg 1 p.o. q.h.s. 17.Mag-Ox 400 mg 1 p.o. b.i.d. 18.Potassium chloride 10 mEq twice a day. 19.Crestor 10 mg 1 p.o. q.h.s. 20.Warfarin 5 mg 1 p.o. daily.  He is monitored by home health RN and     maintain an INR between 2 and 3.    ______________________________ Lanney Gins, PA   ______________________________ Madlyn Frankel. Charlann Boxer, M.D.    MB/MEDQ  D:  05/01/2011  T:  05/01/2011  Job:  [id number]  Electronically Signed by Durene Romans M.D. on 05/06/2011 04:14:30 PM

## 2011-07-30 ENCOUNTER — Other Ambulatory Visit: Payer: Self-pay | Admitting: Urology

## 2011-07-30 DIAGNOSIS — C61 Malignant neoplasm of prostate: Secondary | ICD-10-CM

## 2011-08-05 ENCOUNTER — Encounter (HOSPITAL_COMMUNITY)
Admission: RE | Admit: 2011-08-05 | Discharge: 2011-08-05 | Disposition: A | Discharge status: Home / Self Care | Payer: Medicare Other | Source: Ambulatory Visit | Attending: Urology | Admitting: Urology

## 2011-08-05 DIAGNOSIS — C61 Malignant neoplasm of prostate: Secondary | ICD-10-CM | POA: Insufficient documentation

## 2011-08-05 DIAGNOSIS — Z006 Encounter for examination for normal comparison and control in clinical research program: Secondary | ICD-10-CM | POA: Insufficient documentation

## 2011-08-05 MED ORDER — FLUDEOXYGLUCOSE F - 18 (FDG) INJECTION
11.9000 | Freq: Once | INTRAVENOUS | Status: AC | PRN
  Administered 2011-08-05: 11.9 via INTRAVENOUS

## 2013-05-06 ENCOUNTER — Encounter: Payer: Self-pay | Admitting: Cardiology

## 2013-05-26 ENCOUNTER — Encounter (HOSPITAL_COMMUNITY): Payer: Self-pay | Admitting: Pharmacy Technician

## 2013-05-31 NOTE — Patient Instructions (Addendum)
20 Steven [name]  05/31/2013   Your procedure is scheduled on:  06/06/13  MONDAY  Report to [facility] at  0730     AM.  Call this number if you have problems the morning of surgery: [phone]       Remember:   Do not eat food  Or drink :After Midnight. Sunday NIGHT   Take these medicines the morning of surgery with A SIP OF WATER: Amiodarone, Amlodipine, METOPROLOL   .  Contacts, dentures or partial plates can not be worn to surgery  Leave suitcase in the car. After surgery it may be brought to your room.  For patients admitted to the hospital, checkout time is 11:00 AM day of  discharge.             SPECIAL INSTRUCTIONS- SEE [place] PREPARING FOR SURGERY INSTRUCTION SHEET-     DO NOT WEAR JEWELRY, LOTIONS, POWDERS, OR PERFUMES.  WOMEN-- DO NOT SHAVE LEGS OR UNDERARMS FOR 12 HOURS BEFORE SHOWERS. MEN MAY SHAVE FACE.  Patients discharged the day of surgery will not be allowed to drive home. IF going home the day of surgery, you must have a driver and someone to stay with you for the first 24 hours  Name and phone number of your driver:     ADMISSION                                                                   Please read over the following fact sheets that you were given: MRSA Information, Incentive Spirometry Sheet, Blood Transfusion Sheet  Information                                                                                   Nilay Mangrum  PST 336  [phone]                 FAILURE TO FOLLOW THESE INSTRUCTIONS MAY RESULT IN  CANCELLATION   OF YOUR SURGERY                                                  Patient Signature _____________________________

## 2013-06-01 ENCOUNTER — Ambulatory Visit (HOSPITAL_COMMUNITY)
Admission: RE | Admit: 2013-06-01 | Discharge: 2013-06-01 | Disposition: A | Discharge status: Home / Self Care | Payer: Medicare PPO | Source: Ambulatory Visit | Attending: Orthopedic Surgery | Admitting: Orthopedic Surgery

## 2013-06-01 ENCOUNTER — Encounter (HOSPITAL_COMMUNITY): Payer: Self-pay

## 2013-06-01 ENCOUNTER — Encounter (HOSPITAL_COMMUNITY)
Admission: RE | Admit: 2013-06-01 | Discharge: 2013-06-01 | Disposition: A | Discharge status: Home / Self Care | Payer: Medicare PPO | Source: Ambulatory Visit | Attending: Orthopedic Surgery | Admitting: Orthopedic Surgery

## 2013-06-01 DIAGNOSIS — Z01818 Encounter for other preprocedural examination: Secondary | ICD-10-CM | POA: Insufficient documentation

## 2013-06-01 DIAGNOSIS — M1710 Unilateral primary osteoarthritis, unspecified knee: Secondary | ICD-10-CM | POA: Insufficient documentation

## 2013-06-01 DIAGNOSIS — R918 Other nonspecific abnormal finding of lung field: Secondary | ICD-10-CM | POA: Insufficient documentation

## 2013-06-01 DIAGNOSIS — Z01812 Encounter for preprocedural laboratory examination: Secondary | ICD-10-CM | POA: Insufficient documentation

## 2013-06-01 HISTORY — DX: Chronic kidney disease, stage 4 (severe): N18.4

## 2013-06-01 HISTORY — DX: Personal history of other drug therapy: Z92.29

## 2013-06-01 HISTORY — DX: Anemia, unspecified: D64.9

## 2013-06-01 HISTORY — DX: Vitamin D deficiency, unspecified: E55.9

## 2013-06-01 HISTORY — DX: Long term (current) use of anticoagulants: Z79.01

## 2013-06-01 HISTORY — DX: Hypercalcemia: E83.52

## 2013-06-01 LAB — PROTIME-INR
INR: 1.92 — ABNORMAL HIGH (ref 0.00–1.49)
Prothrombin Time: 21.4 seconds — ABNORMAL HIGH (ref 11.6–15.2)

## 2013-06-01 LAB — SURGICAL PCR SCREEN
MRSA, PCR: NEGATIVE
Staphylococcus aureus: POSITIVE — AB

## 2013-06-01 LAB — CBC
HCT: 32.6 % — ABNORMAL LOW (ref 39.0–52.0)
MCHC: 32.8 g/dL (ref 30.0–36.0)
MCV: 95.6 fL (ref 78.0–100.0)
RBC: 3.41 MIL/uL — ABNORMAL LOW (ref 4.22–5.81)
RDW: 14 % (ref 11.5–15.5)
WBC: 8.6 10*3/uL (ref 4.0–10.5)

## 2013-06-01 LAB — URINALYSIS, ROUTINE W REFLEX MICROSCOPIC
Bilirubin Urine: NEGATIVE
Glucose, UA: NEGATIVE mg/dL
Hgb urine dipstick: NEGATIVE
Nitrite: NEGATIVE
Specific Gravity, Urine: 1.013 (ref 1.005–1.030)
Urobilinogen, UA: 0.2 mg/dL (ref 0.0–1.0)
pH: 6 (ref 5.0–8.0)

## 2013-06-01 LAB — BASIC METABOLIC PANEL
BUN: 79 mg/dL — ABNORMAL HIGH (ref 6–23)
CO2: 24 mEq/L (ref 19–32)
Chloride: 104 mEq/L (ref 96–112)
Potassium: 4 mEq/L (ref 3.5–5.1)
Sodium: 143 mEq/L (ref 135–145)

## 2013-06-01 LAB — APTT: aPTT: 31 seconds (ref 24–37)

## 2013-06-01 NOTE — Progress Notes (Signed)
Faxed chest x ray and BMET to Dr Charlann Boxer through [facility]

## 2013-06-01 NOTE — Progress Notes (Signed)
06/01/13 1331  OBSTRUCTIVE SLEEP APNEA  Have you ever been diagnosed with sleep apnea through a sleep study? No  Do you snore loudly (loud enough to be heard through closed doors)?  0  Do you often feel tired, fatigued, or sleepy during the daytime? 0  Has anyone observed you stop breathing during your sleep? 0  Do you have, or are you being treated for high blood pressure? 1  BMI more than 35 kg/m2? 1  Age over 77 years old? 1  Neck circumference greater than 40 cm/18 inches? 1  Gender: 1  Obstructive Sleep Apnea Score 5  Score 4 or greater  Results sent to PCP

## 2013-06-01 NOTE — Progress Notes (Signed)
EKG with LOV Dr Arlyn Leak and clearance 05/10/13 chart, clearance Dr Foy Guadalajara on chart, clearance Dr Etta Grandchild with LOV 9/14 chart.   Patient states edematous legs today are his "normal" and he has been up all day-  Dr Charlann Boxer appt  And here-  Dialysis FISTULA LEFT ARM

## 2013-06-02 NOTE — Progress Notes (Signed)
Verified with Toni Amend at [facility] that abnormal BMET and chest x ray report were received.  Stated she will give to [facility] for review

## 2013-06-05 NOTE — H&P (Signed)
TOTAL KNEE ADMISSION H&P  Patient is being admitted for right total knee arthroplasty.  Subjective:  Chief Complaint:  Right knee OA / pain.  HPI: Steven [name], 79 y.o. male, has a history of pain and functional disability in the right knee due to arthritis and has failed non-surgical conservative treatments for greater than 12 weeks to includeNSAID's and/or analgesics, corticosteriod injections and activity modification.  Onset of symptoms was gradual, starting years ago with gradually worsening course since that time. The patient noted no past surgery on the right knee(s).  Patient currently rates pain in the right knee(s) at 8 out of 10 with activity. Patient has night pain, worsening of pain with activity and weight bearing, pain that interferes with activities of daily living, pain with passive range of motion, crepitus and joint swelling.  Patient has evidence of periarticular osteophytes and joint space narrowing by imaging studies.  There is no active infection.  Risks, benefits and expectations were discussed with the patient.  Risks including but not limited to the risk of anesthesia, blood clots, nerve damage, blood vessel damage, failure of the prosthesis, infection and up to and including death.  Patient understand the risks, benefits and expectations and wishes to proceed with surgery.   D/C Plans:   Home with HHPT  Post-op Meds:   No Rx given   Tranexamic Acid:   Not to be given - CAD  Decadron:    To be given  FYI:     Coumadin post-op  Past Medical History  Diagnosis Date  . Irregular heartbeat   . Gout   . Hypertension   . Prostate ca   . Osteoarthritis   . PVC (premature ventricular contraction)   . HX: long term anticoagulant use   . Kidney stone   . CKD (chronic kidney disease), stage IV   . Anemia   . Hypercalcemia   . Elevated cholesterol   . Vitamin D deficiency   . Interstitial nephritis     Past Surgical History  Procedure Laterality Date  . Kidney  stone surgery    . Joint replacement Left 2012    knee  . Eye surgery Bilateral     cataract extraction with IOL  . Sp av dialysis shunt access existing *l* Left     no dialysis    No prescriptions prior to admission   Allergies  Allergen Reactions  . Hydrocodone Other (See Comments)    Extreme confusion  . Penicillins Hives    History  Substance Use Topics  . Smoking status: Passive Smoke Exposure - Never Smoker    Types: Cigars  . Smokeless tobacco: Former Neurosurgeon  . Alcohol Use: Yes     Comment: attended AA 1976      Review of Systems  Musculoskeletal: Positive for joint pain.  All other systems reviewed and are negative.    Objective:  Physical Exam  Constitutional: He is oriented to person, place, and time. He appears well-developed and well-nourished.  HENT:  Head: Normocephalic and atraumatic.  Mouth/Throat: Oropharynx is clear and moist.  Eyes: Pupils are equal, round, and reactive to light.  Neck: Neck supple. No JVD present. No tracheal deviation present. No thyromegaly present.  Cardiovascular: Normal rate and intact distal pulses.   Respiratory: Effort normal and breath sounds normal. No stridor.  GI: Soft. There is no tenderness. There is no guarding.  Musculoskeletal:       Right knee: He exhibits decreased range of motion, swelling and bony tenderness.  He exhibits no effusion, no ecchymosis, no deformity, no laceration and no erythema. Tenderness found.  Lymphadenopathy:    He has no cervical adenopathy.  Neurological: He is alert and oriented to person, place, and time.  Skin: Skin is warm and dry.  Psychiatric: He has a normal mood and affect.     Imaging Review Plain radiographs demonstrate severe degenerative joint disease of the right knee(s). The overall alignment is neutral. The bone quality appears to be good for age and reported activity level.  Assessment/Plan:  End stage arthritis, right knee   The patient history, physical  examination, clinical judgment of the provider and imaging studies are consistent with end stage degenerative joint disease of the right knee(s) and total knee arthroplasty is deemed medically necessary. The treatment options including medical management, injection therapy arthroscopy and arthroplasty were discussed at length. The risks and benefits of total knee arthroplasty were presented and reviewed. The risks due to aseptic loosening, infection, stiffness, patella tracking problems, thromboembolic complications and other imponderables were discussed. The patient acknowledged the explanation, agreed to proceed with the plan and consent was signed. Patient is being admitted for inpatient treatment for surgery, pain control, PT, OT, prophylactic antibiotics, VTE prophylaxis, progressive ambulation and ADL's and discharge planning. The patient is planning to be discharged home with home health services.    Anastasio Auerbach Rayhan Groleau   PAC  06/05/2013, 3:13 PM

## 2013-06-06 ENCOUNTER — Inpatient Hospital Stay (HOSPITAL_COMMUNITY): Payer: Medicare PPO | Admitting: Anesthesiology

## 2013-06-06 ENCOUNTER — Encounter (HOSPITAL_COMMUNITY)
Admission: RE | Disposition: A | Discharge status: Skilled Nursing Facility | Payer: Self-pay | Source: Ambulatory Visit | Attending: Orthopedic Surgery

## 2013-06-06 ENCOUNTER — Inpatient Hospital Stay (HOSPITAL_COMMUNITY)
Admission: RE | Admit: 2013-06-06 | Discharge: 2013-06-14 | DRG: 470 | Disposition: A | Discharge status: Skilled Nursing Facility | Payer: Medicare PPO | Source: Ambulatory Visit | Attending: Orthopedic Surgery | Admitting: Orthopedic Surgery

## 2013-06-06 ENCOUNTER — Encounter (HOSPITAL_COMMUNITY): Payer: Medicare PPO | Admitting: Anesthesiology

## 2013-06-06 ENCOUNTER — Encounter (HOSPITAL_COMMUNITY): Payer: Self-pay | Admitting: *Deleted

## 2013-06-06 DIAGNOSIS — Y831 Surgical operation with implant of artificial internal device as the cause of abnormal reaction of the patient, or of later complication, without mention of misadventure at the time of the procedure: Secondary | ICD-10-CM | POA: Diagnosis not present

## 2013-06-06 DIAGNOSIS — Z6839 Body mass index (BMI) 39.0-39.9, adult: Secondary | ICD-10-CM

## 2013-06-06 DIAGNOSIS — M109 Gout, unspecified: Secondary | ICD-10-CM | POA: Diagnosis present

## 2013-06-06 DIAGNOSIS — I519 Heart disease, unspecified: Secondary | ICD-10-CM | POA: Diagnosis not present

## 2013-06-06 DIAGNOSIS — M6580 Other synovitis and tenosynovitis, unspecified site: Secondary | ICD-10-CM | POA: Diagnosis present

## 2013-06-06 DIAGNOSIS — I10 Essential (primary) hypertension: Secondary | ICD-10-CM

## 2013-06-06 DIAGNOSIS — E559 Vitamin D deficiency, unspecified: Secondary | ICD-10-CM | POA: Diagnosis present

## 2013-06-06 DIAGNOSIS — D62 Acute posthemorrhagic anemia: Secondary | ICD-10-CM | POA: Diagnosis not present

## 2013-06-06 DIAGNOSIS — M1710 Unilateral primary osteoarthritis, unspecified knee: Principal | ICD-10-CM | POA: Diagnosis present

## 2013-06-06 DIAGNOSIS — D696 Thrombocytopenia, unspecified: Secondary | ICD-10-CM | POA: Diagnosis present

## 2013-06-06 DIAGNOSIS — Z96649 Presence of unspecified artificial hip joint: Secondary | ICD-10-CM

## 2013-06-06 DIAGNOSIS — M898X9 Other specified disorders of bone, unspecified site: Secondary | ICD-10-CM | POA: Diagnosis present

## 2013-06-06 DIAGNOSIS — Z96651 Presence of right artificial knee joint: Secondary | ICD-10-CM

## 2013-06-06 DIAGNOSIS — I059 Rheumatic mitral valve disease, unspecified: Secondary | ICD-10-CM | POA: Diagnosis present

## 2013-06-06 DIAGNOSIS — Z7901 Long term (current) use of anticoagulants: Secondary | ICD-10-CM

## 2013-06-06 DIAGNOSIS — N184 Chronic kidney disease, stage 4 (severe): Secondary | ICD-10-CM | POA: Diagnosis present

## 2013-06-06 DIAGNOSIS — Z96659 Presence of unspecified artificial knee joint: Secondary | ICD-10-CM

## 2013-06-06 DIAGNOSIS — Z8546 Personal history of malignant neoplasm of prostate: Secondary | ICD-10-CM

## 2013-06-06 DIAGNOSIS — I4891 Unspecified atrial fibrillation: Secondary | ICD-10-CM | POA: Diagnosis not present

## 2013-06-06 DIAGNOSIS — I1310 Hypertensive heart and chronic kidney disease without heart failure, with stage 1 through stage 4 chronic kidney disease, or unspecified chronic kidney disease: Secondary | ICD-10-CM | POA: Diagnosis present

## 2013-06-06 DIAGNOSIS — I119 Hypertensive heart disease without heart failure: Secondary | ICD-10-CM

## 2013-06-06 DIAGNOSIS — M25469 Effusion, unspecified knee: Secondary | ICD-10-CM | POA: Diagnosis present

## 2013-06-06 HISTORY — PX: TOTAL KNEE ARTHROPLASTY: SHX125

## 2013-06-06 HISTORY — DX: Unspecified atrial fibrillation: I48.91

## 2013-06-06 HISTORY — DX: Rheumatic mitral valve disease, unspecified: I05.9

## 2013-06-06 LAB — CBC
HCT: 29.5 % — ABNORMAL LOW (ref 39.0–52.0)
MCHC: 32.9 g/dL (ref 30.0–36.0)
Platelets: 136 10*3/uL — ABNORMAL LOW (ref 150–400)
RDW: 14.1 % (ref 11.5–15.5)
WBC: 8.1 10*3/uL (ref 4.0–10.5)

## 2013-06-06 LAB — CREATININE, SERUM
Creatinine, Ser: 4.11 mg/dL — ABNORMAL HIGH (ref 0.50–1.35)
GFR calc Af Amer: 14 mL/min — ABNORMAL LOW (ref >90)
GFR calc non Af Amer: 12 mL/min — ABNORMAL LOW (ref >90)

## 2013-06-06 LAB — PROTIME-INR
INR: 1.34 (ref 0.00–1.49)
Prothrombin Time: 16.3 seconds — ABNORMAL HIGH (ref 11.6–15.2)

## 2013-06-06 SURGERY — ARTHROPLASTY, KNEE, TOTAL
Anesthesia: General | Site: Knee | Laterality: Right | Wound class: Clean

## 2013-06-06 MED ORDER — SODIUM CHLORIDE 0.9 % IR SOLN
Status: DC | PRN
  Administered 2013-06-06: 1000 mL

## 2013-06-06 MED ORDER — AMIODARONE HCL 100 MG PO TABS
100.0000 mg | ORAL_TABLET | ORAL | Status: DC
  Administered 2013-06-08: 100 mg via ORAL
  Filled 2013-06-06: qty 1

## 2013-06-06 MED ORDER — CLINDAMYCIN PHOSPHATE 600 MG/50ML IV SOLN
600.0000 mg | Freq: Four times a day (QID) | INTRAVENOUS | Status: AC
  Administered 2013-06-06 (×2): 600 mg via INTRAVENOUS
  Filled 2013-06-06 (×2): qty 50

## 2013-06-06 MED ORDER — DEXAMETHASONE SODIUM PHOSPHATE 10 MG/ML IJ SOLN
10.0000 mg | Freq: Once | INTRAMUSCULAR | Status: AC
  Administered 2013-06-06: 10 mg via INTRAVENOUS

## 2013-06-06 MED ORDER — METHOCARBAMOL 100 MG/ML IJ SOLN: 500.0000 mg | Freq: Four times a day (QID) | INTRAVENOUS | Status: DC | PRN

## 2013-06-06 MED ORDER — ROPIVACAINE HCL 5 MG/ML IJ SOLN
INTRAMUSCULAR | Status: AC
  Filled 2013-06-06: qty 30

## 2013-06-06 MED ORDER — BUPIVACAINE-EPINEPHRINE PF 0.25-1:200000 % IJ SOLN
INTRAMUSCULAR | Status: DC | PRN
  Administered 2013-06-06: 25 mL

## 2013-06-06 MED ORDER — ACETAMINOPHEN 325 MG PO TABS
650.0000 mg | ORAL_TABLET | Freq: Four times a day (QID) | ORAL | Status: AC | PRN
  Administered 2013-06-07 – 2013-06-10 (×3): 650 mg via ORAL
  Filled 2013-06-06 (×3): qty 2

## 2013-06-06 MED ORDER — SODIUM CHLORIDE 0.9 % IV SOLN
INTRAVENOUS | Status: DC
  Administered 2013-06-06 – 2013-06-14 (×9): via INTRAVENOUS
  Filled 2013-06-06 (×29): qty 1000

## 2013-06-06 MED ORDER — PHENYLEPHRINE HCL 10 MG/ML IJ SOLN
INTRAMUSCULAR | Status: DC | PRN
  Administered 2013-06-06: 160 ug via INTRAVENOUS
  Administered 2013-06-06 (×2): 80 ug via INTRAVENOUS

## 2013-06-06 MED ORDER — METOPROLOL SUCCINATE ER 100 MG PO TB24
100.0000 mg | ORAL_TABLET | Freq: Every day | ORAL | Status: DC
  Administered 2013-06-07 – 2013-06-14 (×8): 100 mg via ORAL
  Filled 2013-06-06 (×8): qty 1

## 2013-06-06 MED ORDER — BUPIVACAINE LIPOSOME 1.3 % IJ SUSP
20.0000 mL | Freq: Once | INTRAMUSCULAR | Status: AC
  Administered 2013-06-06: 20 mL
  Filled 2013-06-06: qty 20

## 2013-06-06 MED ORDER — SODIUM CHLORIDE 0.9 % IV SOLN
INTRAVENOUS | Status: DC
  Administered 2013-06-06: 1000 mL via INTRAVENOUS

## 2013-06-06 MED ORDER — DIPHENHYDRAMINE HCL 25 MG PO CAPS: 25.0000 mg | ORAL_CAPSULE | Freq: Four times a day (QID) | ORAL | Status: DC | PRN

## 2013-06-06 MED ORDER — LACTATED RINGERS IV SOLN: INTRAVENOUS | Status: DC | PRN

## 2013-06-06 MED ORDER — METOCLOPRAMIDE HCL 5 MG/ML IJ SOLN: 5.0000 mg | Freq: Three times a day (TID) | INTRAMUSCULAR | Status: DC | PRN

## 2013-06-06 MED ORDER — FERROUS SULFATE 325 (65 FE) MG PO TABS
325.0000 mg | ORAL_TABLET | Freq: Three times a day (TID) | ORAL | Status: DC
  Administered 2013-06-06 – 2013-06-14 (×23): 325 mg via ORAL
  Filled 2013-06-06 (×27): qty 1

## 2013-06-06 MED ORDER — METOCLOPRAMIDE HCL 10 MG PO TABS: 5.0000 mg | ORAL_TABLET | Freq: Three times a day (TID) | ORAL | Status: DC | PRN

## 2013-06-06 MED ORDER — MIDAZOLAM HCL 5 MG/5ML IJ SOLN
INTRAMUSCULAR | Status: DC | PRN
  Administered 2013-06-06: 1 mg via INTRAVENOUS

## 2013-06-06 MED ORDER — KETOROLAC TROMETHAMINE 30 MG/ML IJ SOLN
INTRAMUSCULAR | Status: AC
  Filled 2013-06-06: qty 1

## 2013-06-06 MED ORDER — ENOXAPARIN SODIUM 30 MG/0.3ML ~~LOC~~ SOLN
30.0000 mg | SUBCUTANEOUS | Status: DC
  Administered 2013-06-07 – 2013-06-08 (×2): 30 mg via SUBCUTANEOUS
  Filled 2013-06-06 (×3): qty 0.3

## 2013-06-06 MED ORDER — ONDANSETRON HCL 4 MG PO TABS: 4.0000 mg | ORAL_TABLET | Freq: Four times a day (QID) | ORAL | Status: DC | PRN

## 2013-06-06 MED ORDER — ROPIVACAINE HCL 5 MG/ML IJ SOLN
INTRAMUSCULAR | Status: DC | PRN
  Administered 2013-06-06: 30 mL

## 2013-06-06 MED ORDER — DEXAMETHASONE SODIUM PHOSPHATE 10 MG/ML IJ SOLN
10.0000 mg | Freq: Once | INTRAMUSCULAR | Status: AC
  Administered 2013-06-07: 10 mg via INTRAVENOUS
  Filled 2013-06-06: qty 1

## 2013-06-06 MED ORDER — HYDROMORPHONE HCL PF 1 MG/ML IJ SOLN
0.5000 mg | INTRAMUSCULAR | Status: DC | PRN
  Administered 2013-06-12: 1 mg via INTRAVENOUS
  Filled 2013-06-06: qty 1

## 2013-06-06 MED ORDER — ONDANSETRON HCL 4 MG/2ML IJ SOLN: 4.0000 mg | Freq: Four times a day (QID) | INTRAMUSCULAR | Status: DC | PRN

## 2013-06-06 MED ORDER — ENOXAPARIN SODIUM 40 MG/0.4ML ~~LOC~~ SOLN
40.0000 mg | SUBCUTANEOUS | Status: DC
  Filled 2013-06-06: qty 0.4

## 2013-06-06 MED ORDER — SODIUM CHLORIDE 0.9 % IJ SOLN
INTRAMUSCULAR | Status: AC
  Filled 2013-06-06: qty 50

## 2013-06-06 MED ORDER — FENTANYL CITRATE 0.05 MG/ML IJ SOLN
INTRAMUSCULAR | Status: DC | PRN
  Administered 2013-06-06 (×5): 50 ug via INTRAVENOUS

## 2013-06-06 MED ORDER — SODIUM CHLORIDE 0.9 % IJ SOLN
INTRAMUSCULAR | Status: DC | PRN
  Administered 2013-06-06: 14 mL

## 2013-06-06 MED ORDER — STERILE WATER FOR IRRIGATION IR SOLN
Status: DC | PRN
  Administered 2013-06-06: 1500 mL

## 2013-06-06 MED ORDER — AMLODIPINE BESYLATE 5 MG PO TABS
5.0000 mg | ORAL_TABLET | Freq: Two times a day (BID) | ORAL | Status: DC
  Administered 2013-06-06 – 2013-06-08 (×3): 5 mg via ORAL
  Filled 2013-06-06 (×5): qty 1

## 2013-06-06 MED ORDER — ONDANSETRON HCL 4 MG/2ML IJ SOLN
INTRAMUSCULAR | Status: DC | PRN
  Administered 2013-06-06: 4 mg via INTRAVENOUS

## 2013-06-06 MED ORDER — PROPOFOL 10 MG/ML IV BOLUS
INTRAVENOUS | Status: DC | PRN
  Administered 2013-06-06: 100 mg via INTRAVENOUS

## 2013-06-06 MED ORDER — BUPIVACAINE-EPINEPHRINE PF 0.25-1:200000 % IJ SOLN
INTRAMUSCULAR | Status: AC
  Filled 2013-06-06: qty 30

## 2013-06-06 MED ORDER — KETOROLAC TROMETHAMINE 30 MG/ML IJ SOLN
INTRAMUSCULAR | Status: DC | PRN
  Administered 2013-06-06: 30 mg

## 2013-06-06 MED ORDER — POLYETHYLENE GLYCOL 3350 17 G PO PACK
17.0000 g | PACK | Freq: Two times a day (BID) | ORAL | Status: DC
  Administered 2013-06-06 – 2013-06-11 (×8): 17 g via ORAL
  Filled 2013-06-06 (×13): qty 1

## 2013-06-06 MED ORDER — FUROSEMIDE 40 MG PO TABS
40.0000 mg | ORAL_TABLET | Freq: Two times a day (BID) | ORAL | Status: DC
  Administered 2013-06-07 – 2013-06-14 (×16): 40 mg via ORAL
  Filled 2013-06-06 (×18): qty 1

## 2013-06-06 MED ORDER — TRAMADOL HCL 50 MG PO TABS
50.0000 mg | ORAL_TABLET | Freq: Two times a day (BID) | ORAL | Status: AC
  Administered 2013-06-06: 100 mg via ORAL
  Administered 2013-06-07 (×2): 50 mg via ORAL
  Administered 2013-06-08: 100 mg via ORAL
  Administered 2013-06-09: 50 mg via ORAL
  Administered 2013-06-09: 23:00:00 100 mg via ORAL
  Administered 2013-06-10: 50 mg via ORAL
  Filled 2013-06-06 (×2): qty 1
  Filled 2013-06-06: qty 2
  Filled 2013-06-06: qty 1
  Filled 2013-06-06 (×2): qty 2

## 2013-06-06 MED ORDER — MENTHOL 3 MG MT LOZG: 1.0000 | LOZENGE | OROMUCOSAL | Status: DC | PRN

## 2013-06-06 MED ORDER — LIDOCAINE HCL (CARDIAC) 20 MG/ML IV SOLN
INTRAVENOUS | Status: DC | PRN
  Administered 2013-06-06: 50 mg via INTRAVENOUS

## 2013-06-06 MED ORDER — BISACODYL 10 MG RE SUPP: 10.0000 mg | Freq: Every day | RECTAL | Status: DC | PRN

## 2013-06-06 MED ORDER — NEOSTIGMINE METHYLSULFATE 1 MG/ML IJ SOLN
INTRAMUSCULAR | Status: DC | PRN
  Administered 2013-06-06: 3 mg via INTRAVENOUS

## 2013-06-06 MED ORDER — ROCURONIUM BROMIDE 100 MG/10ML IV SOLN
INTRAVENOUS | Status: DC | PRN
  Administered 2013-06-06: 40 mg via INTRAVENOUS

## 2013-06-06 MED ORDER — ALUM & MAG HYDROXIDE-SIMETH 200-200-20 MG/5ML PO SUSP
30.0000 mL | ORAL | Status: DC | PRN
  Administered 2013-06-07 – 2013-06-09 (×3): 30 mL via ORAL
  Filled 2013-06-06 (×3): qty 30

## 2013-06-06 MED ORDER — HYDROCODONE-ACETAMINOPHEN 7.5-325 MG PO TABS
1.0000 | ORAL_TABLET | ORAL | Status: DC
  Administered 2013-06-06 (×2): 1 via ORAL
  Filled 2013-06-06 (×2): qty 1

## 2013-06-06 MED ORDER — CLINDAMYCIN PHOSPHATE 900 MG/50ML IV SOLN
900.0000 mg | INTRAVENOUS | Status: AC
  Administered 2013-06-06: 900 mg via INTRAVENOUS

## 2013-06-06 MED ORDER — CELECOXIB 200 MG PO CAPS
200.0000 mg | ORAL_CAPSULE | Freq: Two times a day (BID) | ORAL | Status: DC
  Administered 2013-06-06: 22:00:00 200 mg via ORAL
  Filled 2013-06-06 (×3): qty 1

## 2013-06-06 MED ORDER — FLEET ENEMA 7-19 GM/118ML RE ENEM: 1.0000 | ENEMA | Freq: Once | RECTAL | Status: AC | PRN

## 2013-06-06 MED ORDER — GLYCOPYRROLATE 0.2 MG/ML IJ SOLN
INTRAMUSCULAR | Status: DC | PRN
  Administered 2013-06-06: 0.4 mg via INTRAVENOUS

## 2013-06-06 MED ORDER — DOCUSATE SODIUM 100 MG PO CAPS
100.0000 mg | ORAL_CAPSULE | Freq: Two times a day (BID) | ORAL | Status: DC
  Administered 2013-06-06 – 2013-06-14 (×16): 100 mg via ORAL
  Filled 2013-06-06 (×13): qty 1

## 2013-06-06 MED ORDER — WARFARIN SODIUM 5 MG PO TABS
5.0000 mg | ORAL_TABLET | Freq: Once | ORAL | Status: AC
  Administered 2013-06-06: 20:00:00 5 mg via ORAL
  Filled 2013-06-06: qty 1

## 2013-06-06 MED ORDER — ALLOPURINOL 100 MG PO TABS
100.0000 mg | ORAL_TABLET | Freq: Every day | ORAL | Status: DC
  Administered 2013-06-07 – 2013-06-14 (×8): 100 mg via ORAL
  Filled 2013-06-06 (×8): qty 1

## 2013-06-06 MED ORDER — MIRTAZAPINE 15 MG PO TABS
15.0000 mg | ORAL_TABLET | Freq: Every day | ORAL | Status: DC
  Administered 2013-06-06 – 2013-06-13 (×8): 15 mg via ORAL
  Filled 2013-06-06 (×9): qty 1

## 2013-06-06 MED ORDER — PHENYLEPHRINE HCL 10 MG/ML IJ SOLN
10.0000 mg | INTRAVENOUS | Status: DC | PRN
  Administered 2013-06-06: 50 ug/min via INTRAVENOUS

## 2013-06-06 MED ORDER — WARFARIN - PHARMACIST DOSING INPATIENT
Freq: Every day | Status: DC
  Administered 2013-06-11: 18:00:00

## 2013-06-06 MED ORDER — PHENOL 1.4 % MT LIQD: 1.0000 | OROMUCOSAL | Status: DC | PRN

## 2013-06-06 MED ORDER — ATORVASTATIN CALCIUM 10 MG PO TABS
10.0000 mg | ORAL_TABLET | Freq: Every day | ORAL | Status: DC
  Administered 2013-06-06 – 2013-06-13 (×8): 10 mg via ORAL
  Filled 2013-06-06 (×9): qty 1

## 2013-06-06 MED ORDER — METHOCARBAMOL 500 MG PO TABS
500.0000 mg | ORAL_TABLET | Freq: Four times a day (QID) | ORAL | Status: DC | PRN
  Administered 2013-06-11 – 2013-06-14 (×4): 500 mg via ORAL
  Filled 2013-06-06 (×4): qty 1

## 2013-06-06 MED ORDER — MEDROXYPROGESTERONE ACETATE 5 MG PO TABS
5.0000 mg | ORAL_TABLET | Freq: Two times a day (BID) | ORAL | Status: DC
  Administered 2013-06-06 – 2013-06-14 (×16): 5 mg via ORAL
  Filled 2013-06-06 (×18): qty 1

## 2013-06-06 MED ORDER — CLINDAMYCIN PHOSPHATE 900 MG/50ML IV SOLN
INTRAVENOUS | Status: AC
  Filled 2013-06-06: qty 50

## 2013-06-06 SURGICAL SUPPLY — 54 items
BAG ZIPLOCK 12X15 (MISCELLANEOUS) ×2 IMPLANT
BANDAGE ELASTIC 6 VELCRO ST LF (GAUZE/BANDAGES/DRESSINGS) ×2 IMPLANT
BANDAGE ESMARK 6X9 LF (GAUZE/BANDAGES/DRESSINGS) ×1 IMPLANT
BLADE SAW SGTL 13.0X1.19X90.0M (BLADE) ×2 IMPLANT
BNDG ESMARK 6X9 LF (GAUZE/BANDAGES/DRESSINGS) ×2
BOWL SMART MIX CTS (DISPOSABLE) ×2 IMPLANT
CAPT RP KNEE ×2 IMPLANT
CEMENT HV SMART SET (Cement) ×4 IMPLANT
CUFF TOURN SGL QUICK 34 (TOURNIQUET CUFF) ×1
CUFF TRNQT CYL 34X4X40X1 (TOURNIQUET CUFF) ×1 IMPLANT
DECANTER SPIKE VIAL GLASS SM (MISCELLANEOUS) ×2 IMPLANT
DERMABOND ADVANCED (GAUZE/BANDAGES/DRESSINGS) ×1
DERMABOND ADVANCED .7 DNX12 (GAUZE/BANDAGES/DRESSINGS) ×1 IMPLANT
DRAPE EXTREMITY T 121X128X90 (DRAPE) ×2 IMPLANT
DRAPE POUCH INSTRU U-SHP 10X18 (DRAPES) ×2 IMPLANT
DRAPE U-SHAPE 47X51 STRL (DRAPES) ×2 IMPLANT
DRSG AQUACEL AG ADV 3.5X10 (GAUZE/BANDAGES/DRESSINGS) ×2 IMPLANT
DRSG TEGADERM 4X4.75 (GAUZE/BANDAGES/DRESSINGS) ×2 IMPLANT
DURAPREP 26ML APPLICATOR (WOUND CARE) ×4 IMPLANT
ELECT REM PT RETURN 9FT ADLT (ELECTROSURGICAL) ×2
ELECTRODE REM PT RTRN 9FT ADLT (ELECTROSURGICAL) ×1 IMPLANT
EVACUATOR 1/8 PVC DRAIN (DRAIN) ×2 IMPLANT
FACESHIELD LNG OPTICON STERILE (SAFETY) ×10 IMPLANT
GAUZE SPONGE 2X2 8PLY STRL LF (GAUZE/BANDAGES/DRESSINGS) ×1 IMPLANT
GLOVE BIOGEL PI IND STRL 7.5 (GLOVE) ×1 IMPLANT
GLOVE BIOGEL PI IND STRL 8 (GLOVE) ×1 IMPLANT
GLOVE BIOGEL PI INDICATOR 7.5 (GLOVE) ×1
GLOVE BIOGEL PI INDICATOR 8 (GLOVE) ×1
GLOVE ECLIPSE 8.0 STRL XLNG CF (GLOVE) ×2 IMPLANT
GLOVE ORTHO TXT STRL SZ7.5 (GLOVE) ×4 IMPLANT
GOWN BRE IMP PREV XXLGXLNG (GOWN DISPOSABLE) ×2 IMPLANT
GOWN PREVENTION PLUS LG XLONG (DISPOSABLE) ×2 IMPLANT
HANDPIECE INTERPULSE COAX TIP (DISPOSABLE) ×1
KIT BASIN OR (CUSTOM PROCEDURE TRAY) ×2 IMPLANT
MANIFOLD NEPTUNE II (INSTRUMENTS) ×2 IMPLANT
NDL SAFETY ECLIPSE 18X1.5 (NEEDLE) ×1 IMPLANT
NEEDLE HYPO 18GX1.5 SHARP (NEEDLE) ×1
NS IRRIG 1000ML POUR BTL (IV SOLUTION) ×2 IMPLANT
PACK TOTAL JOINT (CUSTOM PROCEDURE TRAY) ×2 IMPLANT
POSITIONER SURGICAL ARM (MISCELLANEOUS) ×2 IMPLANT
SET HNDPC FAN SPRY TIP SCT (DISPOSABLE) ×1 IMPLANT
SET PAD KNEE POSITIONER (MISCELLANEOUS) ×2 IMPLANT
SPONGE GAUZE 2X2 STER 10/PKG (GAUZE/BANDAGES/DRESSINGS) ×1
SUCTION FRAZIER 12FR DISP (SUCTIONS) ×2 IMPLANT
SUT MNCRL AB 4-0 PS2 18 (SUTURE) ×2 IMPLANT
SUT VIC AB 1 CT1 36 (SUTURE) ×2 IMPLANT
SUT VIC AB 2-0 CT1 27 (SUTURE) ×3
SUT VIC AB 2-0 CT1 TAPERPNT 27 (SUTURE) ×3 IMPLANT
SUT VLOC 180 0 24IN GS25 (SUTURE) ×2 IMPLANT
SYR 50ML LL SCALE MARK (SYRINGE) ×2 IMPLANT
TOWEL OR 17X26 10 PK STRL BLUE (TOWEL DISPOSABLE) ×4 IMPLANT
TRAY FOLEY CATH 14FRSI W/METER (CATHETERS) ×2 IMPLANT
WATER STERILE IRR 1500ML POUR (IV SOLUTION) ×2 IMPLANT
WRAP KNEE MAXI GEL POST OP (GAUZE/BANDAGES/DRESSINGS) ×2 IMPLANT

## 2013-06-06 NOTE — Transfer of Care (Signed)
Immediate Anesthesia Transfer of Care Note  Patient: Steven [name]  Procedure(s) Performed: Procedure(s): RIGHT TOTAL KNEE ARTHROPLASTY (Right)  Patient Location: PACU  Anesthesia Type:General  Level of Consciousness: awake and patient cooperative  Airway & Oxygen Therapy: Patient Spontanous Breathing and Patient connected to face mask oxygen  Post-op Assessment: Report given to PACU RN and Post -op Vital signs reviewed and stable  Post vital signs: Reviewed and stable  Complications: No apparent anesthesia complications

## 2013-06-06 NOTE — Progress Notes (Signed)
Utilization review completed.  

## 2013-06-06 NOTE — Op Note (Signed)
NAME:  Steven [name]                      MEDICAL RECORD NO.:  [id number]                             FACILITY:  [facility]      PHYSICIAN:  Madlyn Frankel. Charlann Boxer, M.D.  DATE OF BIRTH:  [birth year]      DATE OF PROCEDURE:  06/06/2013                                     OPERATIVE REPORT         PREOPERATIVE DIAGNOSIS:  Right knee osteoarthritis.      POSTOPERATIVE DIAGNOSIS:  Right knee osteoarthritis.      FINDINGS:  The patient was noted to have complete loss of cartilage and   bone-on-bone arthritis with associated osteophytes in all 3 compartments of   the knee with a significant synovitis and associated effusion.      PROCEDURE:  Right total knee replacement.      COMPONENTS USED:  DePuy rotating platform posterior stabilized knee   system, a size 5 femur, 5 tibia, 17.5 mm PS insert, and 41 patellar   button.      SURGEON:  Madlyn Frankel. Charlann Boxer, M.D.      ASSISTANT:  Lanney Gins, PA-C.      ANESTHESIA:  General.      SPECIMENS:  None.      COMPLICATION:  None.      DRAINS:  One Hemovac.  EBL: Less than 150 cc      TOURNIQUET TIME:   Total Tourniquet Time Documented: Thigh (Right) - 44 minutes Total: Thigh (Right) - 44 minutes  .      The patient was stable to the recovery room.      INDICATION FOR PROCEDURE:  Steven [name] is a 79 y.o. male patient of   mine.  The patient had been seen, evaluated, and treated conservatively in the   office with medication, activity modification, and injections.  The patient had   radiographic changes of bone-on-bone arthritis with endplate sclerosis and osteophytes noted.      The patient failed conservative measures including medication, injections, and activity modification, and at this point was ready for more definitive measures.   Based on the radiographic changes and failed conservative measures, the patient   decided to proceed with total knee replacement.  Risks of infection,   DVT, component failure, need for revision  surgery, postop course, and   expectations were all   discussed and reviewed.  Consent was obtained for benefit of pain   relief.      PROCEDURE IN DETAIL:  The patient was brought to the operative theater.   Once adequate anesthesia, preoperative antibiotics, 900 mg of Cleocin administered, the patient was positioned supine with the right thigh tourniquet placed.  The  right lower extremity was prepped and draped in sterile fashion.  A time-   out was performed identifying the patient, planned procedure, and   extremity.      The right lower extremity was placed in the [facility] leg holder.  The leg was   exsanguinated, tourniquet elevated to 250 mmHg.  A midline incision was   made followed by median parapatellar arthrotomy.  Following initial   exposure, attention  was first directed to the patella.  Precut   measurement was noted to be 27 mm.  I resected down to 15 mm and used a   41 patellar button to restore patellar height as well as cover the cut   surface.      The lug holes were drilled and a metal shim was placed to protect the   patella from retractors and saw blades.      At this point, attention was now directed to the femur.  The femoral   canal was opened with a drill, irrigated to try to prevent fat emboli.  An   intramedullary rod was passed at 5 degrees valgus, 10 mm of bone was   resected off the distal femur.  Following this resection, the tibia was   subluxated anteriorly.  Using the extramedullary guide, 2 mm of bone was resected off   the proximal medial tibia.  We confirmed the gap would be   stable medially and laterally with a 10 mm insert as well as confirmed   the cut was perpendicular in the coronal plane, checking with an alignment rod.      Once this was done, I sized the femur to be a size 5 in the anterior-   posterior dimension, chose a standard component based on medial and   lateral dimension.  The size 5 rotation block was then pinned in   position  anterior referenced using the C-clamp to set rotation.  The   anterior, posterior, and  chamfer cuts were made without difficulty nor   notching making certain that I was along the anterior cortex to help   with flexion gap stability.      The final box cut was made off the lateral aspect of distal femur.      At this point, the tibia was sized to be a size 5, the size 5 tray was   then pinned in position through the medial third of the tubercle,   drilled, and keel punched.  Trial reduction was now carried with a 5 femur,  5 tibia, a 15 mm insert at first and then a 17.5 mm insert, and the 41 patella botton.  The knee was brought to   extension, full extension with good flexion stability with the patella   tracking through the trochlea without application of pressure.  Given   all these findings, the trial components removed.  Final components were   opened and cement was mixed.  The knee was irrigated with normal saline   solution and pulse lavage.  The synovial lining was   then injected with a combination including 20 cc of Exparel, 0.25% Marcaine with epinephrine and 1 cc of Toradol,   total of 61 cc.      The knee was irrigated.  Final implants were then cemented onto clean and   dried cut surfaces of bone with the knee brought to extension with a 15 mm trial insert.      Once the cement had fully cured, the excess cement was removed   throughout the knee.  I confirmed I was satisfied with the range of   motion and stability, and the final 17.5 mm PS insert was chosen.  It was   placed into the knee.      The tourniquet had been let down at 43 minutes.  No significant   hemostasis required.  The medium Hemovac drain was placed deep.  The   extensor mechanism  was then reapproximated using #1 Vicryl and 0 V-lock sutures with the knee   in flexion.  The   remaining wound was closed with 2-0 Vicryl and running 4-0 Monocryl.   The knee was cleaned, dried, dressed sterilely using  Dermabond and   Aquacel dressing.  Drain site dressed separately.  The patient was then   brought to recovery room in stable condition, tolerating the procedure   well.   Please note that Physician Assistant, Lanney Gins, was present for the entirety of the case, and was utilized for pre-operative positioning, peri-operative retractor management, general facilitation of the procedure.  He was also involved with primary wound closure.    Durene Romans, MD

## 2013-06-06 NOTE — Anesthesia Procedure Notes (Signed)
Anesthesia Regional Block:  Femoral nerve block  Pre-Anesthetic Checklist: ,, timeout performed, Correct Patient, Correct Site, Correct Laterality, Correct Procedure, Correct Position, site marked, Risks and benefits discussed,  Surgical consent,  Pre-op evaluation,  At surgeon's request and post-op pain management  Laterality: Right  Prep: chloraprep       Needles:  Injection technique: Single-shot  Needle Type: Echogenic Stimulator Needle     Needle Length:cm 9 cm Needle Gauge: 20 and 20 G    Additional Needles:  Procedures: ultrasound guided (picture in chart) Femoral nerve block  Nerve Stimulator or Paresthesia:  Response: 0.5 mA,   Additional Responses:   Narrative:  Start time: 06/06/2013 10:16 AM End time: 06/06/2013 10:26 AM Injection made incrementally with aspirations every 5 mL.  Performed by: Personally  Anesthesiologist: Gaetano Hawthorne MD  Additional Notes: Patient tolerated the procedure well without complications  Femoral nerve block

## 2013-06-06 NOTE — Preoperative (Signed)
Beta Blockers   Reason not to administer Beta Blockers:Not Applicable 

## 2013-06-06 NOTE — Interval H&P Note (Signed)
History and Physical Interval Note:  06/06/2013 9:57 AM  Steven [name]  has presented today for surgery, with the diagnosis of right knee osteoarthritis  The various methods of treatment have been discussed with the patient and family. After consideration of risks, benefits and other options for treatment, the patient has consented to  Procedure(s): RIGHT TOTAL KNEE ARTHROPLASTY (Right) as a surgical intervention .  The patient's history has been reviewed, patient examined, no change in status, stable for surgery.  I have reviewed the patient's chart and labs.  Questions were answered to the patient's satisfaction.     [name]

## 2013-06-06 NOTE — Anesthesia Preprocedure Evaluation (Addendum)
Anesthesia Evaluation  Patient identified by MRN, date of birth, ID band Patient awake    Reviewed: Allergy & Precautions, H&P , NPO status , Patient's Chart, lab work & pertinent test results, reviewed documented beta blocker date and time   Airway Mallampati: II TM Distance: >3 FB Neck ROM: full    Dental  (+) Edentulous Upper, Edentulous Lower and Dental Advisory Given   Pulmonary neg pulmonary ROS,  breath sounds clear to auscultation  Pulmonary exam normal       Cardiovascular hypertension, Pt. on medications and Pt. on home beta blockers + dysrhythmias Atrial Fibrillation Rhythm:regular Rate:Normal     Neuro/Psych negative neurological ROS  negative psych ROS   GI/Hepatic negative GI ROS, Neg liver ROS,   Endo/Other  negative endocrine ROS  Renal/GU CRFRenal disease  negative genitourinary   Musculoskeletal   Abdominal   Peds  Hematology negative hematology ROS (+)   Anesthesia Other Findings   Reproductive/Obstetrics negative OB ROS                           Anesthesia Physical Anesthesia Plan  ASA: III  Anesthesia Plan: General   Post-op Pain Management:    Induction:   Airway Management Planned: Oral ETT  Additional Equipment:   Intra-op Plan:   Post-operative Plan: Extubation in OR  Informed Consent: I have reviewed the patients History and Physical, chart, labs and discussed the procedure including the risks, benefits and alternatives for the proposed anesthesia with the patient or authorized representative who has indicated his/her understanding and acceptance.   Dental Advisory Given  Plan Discussed with: CRNA and Surgeon  Anesthesia Plan Comments:        Anesthesia Quick Evaluation

## 2013-06-06 NOTE — Anesthesia Postprocedure Evaluation (Signed)
  Anesthesia Post-op Note  Patient: Steven [name]  Procedure(s) Performed: Procedure(s) (LRB): RIGHT TOTAL KNEE ARTHROPLASTY (Right)  Patient Location: PACU  Anesthesia Type: General  Level of Consciousness: awake and alert   Airway and Oxygen Therapy: Patient Spontanous Breathing  Post-op Pain: mild  Post-op Assessment: Post-op Vital signs reviewed, Patient's Cardiovascular Status Stable, Respiratory Function Stable, Patent Airway and No signs of Nausea or vomiting  Last Vitals:  Filed Vitals:   06/06/13 1323  BP: 94/43  Pulse: 61  Temp:   Resp: 22    Post-op Vital Signs: stable   Complications: No apparent anesthesia complications

## 2013-06-06 NOTE — Progress Notes (Signed)
ANTICOAGULATION CONSULT NOTE - Initial Consult  Pharmacy Consult for Warfarin  Indication: VTE prophylaxis, Hx A.Fib  Allergies  Allergen Reactions  . Hydrocodone Other (See Comments)    Extreme confusion  . Penicillins Hives    Patient Measurements:  Vital Signs: Temp: 98 F (36.7 C) (11/10 1400) Temp src: Oral (11/10 0716) BP: 105/61 mmHg (11/10 1400) Pulse Rate: 62 (11/10 1400)  Labs:  Recent Labs  06/06/13 0730  LABPROT 16.3*  INR 1.34    CrCl is unknown because there is no height on file for the current visit.   Medical History: Past Medical History  Diagnosis Date  . Irregular heartbeat   . Gout   . Hypertension   . Prostate ca   . Osteoarthritis   . PVC (premature ventricular contraction)   . HX: long term anticoagulant use   . Kidney stone   . CKD (chronic kidney disease), stage IV   . Anemia   . Hypercalcemia   . Elevated cholesterol   . Vitamin D deficiency   . Interstitial nephritis     Medications:  Scheduled:  . allopurinol  100 mg Oral Daily  . [START ON 06/08/2013] amiodarone  100 mg Oral Q M,W,F  . amLODipine  5 mg Oral BID  . atorvastatin  10 mg Oral QHS  . celecoxib  200 mg Oral Q12H  . clindamycin (CLEOCIN) IV  600 mg Intravenous Q6H  . [START ON 06/07/2013] dexamethasone  10 mg Intravenous Once  . docusate sodium  100 mg Oral BID  . [START ON 06/07/2013] enoxaparin (LOVENOX) injection  40 mg Subcutaneous Q24H  . ferrous sulfate  325 mg Oral TID PC  . [START ON 06/07/2013] furosemide  40 mg Oral BID WC  . HYDROcodone-acetaminophen  1-2 tablet Oral Q4H  . medroxyPROGESTERone  5 mg Oral BID  . metoprolol succinate  100 mg Oral Daily  . mirtazapine  15 mg Oral QHS  . polyethylene glycol  17 g Oral BID   Infusions:  . sodium chloride 0.9 % 1,000 mL with potassium chloride 10 mEq infusion     PRN: alum & mag hydroxide-simeth, bisacodyl, diphenhydrAMINE, HYDROmorphone (DILAUDID) injection, menthol-cetylpyridinium, methocarbamol  (ROBAXIN) IV, methocarbamol, metoCLOPramide (REGLAN) injection, metoCLOPramide, ondansetron (ZOFRAN) IV, ondansetron, phenol, sodium phosphate  Assessment:  79 yo M s/p R TKR on 06/06/13, also with history of A.Fib on chronic anticoagulation with warfarin.  Resuming warfarin post-operatively.  Warfarin dose prior to admission reported as 5 mg.   Sundays/Wednesdays and 2.5 mg on all other days.  Warfarin was held prior to surgery, last dose was prior to admission was on 11/4.  INR today 1.34 at baseline.  Patient also has Lovenox ordered until INR is >/= 1.8 to start on 11/11 - will adjust for renal insufficiency   Goal of Therapy:  INR 2-3 Monitor platelets by anticoagulation protocol: Yes   Plan:  1.) Warfarin 5 mg po x 1 tonight at 2000  2.) Lovenox daily ordered per MD until INR is >/= 1.8 3.) Daily PT/INR, Monitor Renal function on lovenox 4.) Monitor for s/sx bleeding   Johnathan Heskett, Loma Messing PharmD Pager #: [phone] 2:58 PM 06/06/2013

## 2013-06-07 LAB — BASIC METABOLIC PANEL
BUN: 77 mg/dL — ABNORMAL HIGH (ref 6–23)
CO2: 20 mEq/L (ref 19–32)
Calcium: 9.2 mg/dL (ref 8.4–10.5)
GFR calc Af Amer: 12 mL/min — ABNORMAL LOW (ref >90)
GFR calc non Af Amer: 10 mL/min — ABNORMAL LOW (ref >90)
Glucose, Bld: 162 mg/dL — ABNORMAL HIGH (ref 70–99)
Potassium: 5 mEq/L (ref 3.5–5.1)
Sodium: 137 mEq/L (ref 135–145)

## 2013-06-07 LAB — CBC
Hemoglobin: 7.4 g/dL — ABNORMAL LOW (ref 13.0–17.0)
MCH: 31.4 pg (ref 26.0–34.0)
MCHC: 32.6 g/dL (ref 30.0–36.0)
RDW: 14 % (ref 11.5–15.5)

## 2013-06-07 LAB — PREPARE RBC (CROSSMATCH)

## 2013-06-07 LAB — PROTIME-INR
INR: 1.5 — ABNORMAL HIGH (ref 0.00–1.49)
Prothrombin Time: 17.7 seconds — ABNORMAL HIGH (ref 11.6–15.2)

## 2013-06-07 MED ORDER — WARFARIN SODIUM 5 MG PO TABS
5.0000 mg | ORAL_TABLET | Freq: Once | ORAL | Status: AC
  Administered 2013-06-07: 5 mg via ORAL
  Filled 2013-06-07: qty 1

## 2013-06-07 NOTE — Care Management Note (Addendum)
    Page 1 of 2   06/14/2013     3:34:13 PM   CARE MANAGEMENT NOTE 06/14/2013  Patient:  ARLISS, FRISINA   Account Number:  [id number]  Date Initiated:  06/07/2013  Documentation initiated by:  Colleen Can  Subjective/Objective Assessment:   dx rt knee osteoarthritis; total knee replacemnt     Action/Plan:   HH vs SNF. CM will follow progress.Therapy is recommending SNF./24 hr supervision, 2+ assist   Anticipated DC Date:  06/14/2013   Anticipated DC Plan:  SKILLED NURSING FACILITY  In-house referral  Clinical Social Worker      DC Planning Services  CM consult      Choice offered to / List presented to:             Status of service:  Completed, signed off Medicare Important Message given?  NA - LOS <3 / Initial given by admissions (If response is "NO", the following Medicare IM given date fields will be blank) Date Medicare IM given:   Date Additional Medicare IM given:    Discharge Disposition:  SKILLED NURSING FACILITY  Per UR Regulation:  Reviewed for med. necessity/level of care/duration of stay  If discussed at Long Length of Stay Meetings, dates discussed:   06/14/2013    Comments:  06/13/13 Delcia Spitzley RN,BSN NCM [phone] POD#7 R TKA.CARDIO-FOR TRANSFER TO [facility] FOR CARDIOVERSION.D/C PLAN SNF.  06/10/13 Deaunna Olarte RN,BSN NCM [phone] TRANSFER FROM SDU.POD#4 R TKA,AFIB-CARDIO FOLLOWING.PT-SNF.FROM HOME D/C PLAN SNF.  [id number]/Rhonda Earlene Plater RN, BSN, [place] [phone] Chart Reviewed for discharge and hospital needs.  Patient transferred to icu s/p surg due to a.fib and iv cardizem drip. Discharge needs at time of review:  None present will follow for needs. Review of patient progress due on [id number].

## 2013-06-07 NOTE — Plan of Care (Signed)
Problem: Consults Goal: Diagnosis- Total Joint Replacement Outcome: Completed/Met Date Met:  06/07/13 Primary Total Knee RIGHT

## 2013-06-07 NOTE — Evaluation (Signed)
Occupational Therapy Evaluation Patient Details Name: Steven [name] MRN: [id number] DOB: Jul 11, 1927 Today's Date: 06/07/2013 Time: 4098-1191 OT Time Calculation (min): 42 min  OT Assessment / Plan / Recommendation History of present illness Pt is an 79 yo male admitted for R TKA. Pt underwent L TKA two years ago.   Clinical Impression   Pt admitted for above diagnosis and has the deficits listed below.  Pt would benefit from cont OT to increase basic indpendence with all adls.  Pt with significant weakness noted in RLE to point where pt cannot bear any weight on it during transfers.  Pt with no pain in RLE, just weakness.  Feel he will need SNF rehab if this does not improve.    OT Assessment  Patient needs continued OT Services    Follow Up Recommendations  SNF;Supervision/Assistance - 24 hour    Barriers to Discharge Decreased caregiver support Pt has +1 min assist at home.  Currently needs more than this.  Equipment Recommendations  None recommended by OT    Recommendations for Other Services Other (comment) (MD to take a look at RLE strength)  Frequency  Min 2X/week    Precautions / Restrictions Precautions Precautions: Knee;Fall Precaution Comments: Pt fall risk.  Pt appears to not be moving RLE actively much at all in quad area.  Pt with no pain and even flexed knee (by gravity assisting) past 90 degrees and had no pain. Pt unable to take any weight onto R side without leg buckling.  Make sure to donn KI. Required Braces or Orthoses: Knee Immobilizer - Right (no order for it but since quads not kicking in, KI was used) Knee Immobilizer - Right: Other (comment) (not ordered but used due to quad weakness.) Restrictions Weight Bearing Restrictions: No   Pertinent Vitals/Pain Pt with no reports of pain.  BP and HR stable.  Hgb 7.4.  Pt waiting for transfusion.  No c/o dizziness.    ADL  Eating/Feeding: Performed;Set up Where Assessed - Eating/Feeding: Bed level Grooming:  Simulated;Set up Where Assessed - Grooming: Supported sitting Upper Body Bathing: Simulated;Set up Where Assessed - Upper Body Bathing: Supported sitting Lower Body Bathing: Simulated;+2 Total assistance Lower Body Bathing: Patient Percentage: 70% Where Assessed - Lower Body Bathing: Supported sit to stand Upper Body Dressing: Simulated;Set up Where Assessed - Upper Body Dressing: Supported sitting Lower Body Dressing: Simulated;+2 Total assistance Lower Body Dressing: Patient Percentage: 40% Where Assessed - Lower Body Dressing: Supported sit to stand Toilet Transfer: Performed;+2 Total assistance Toilet Transfer: Patient Percentage: 40% Statistician Method: Surveyor, minerals: Materials engineer and Hygiene: Simulated;+2 Total assistance Toileting - Architect and Hygiene: Patient Percentage: 10% Where Assessed - Engineer, mining and Hygiene: Standing Equipment Used: Rolling walker;Knee Immobilizer Transfers/Ambulation Related to ADLs: Pt took a few steps to chair.  Pt so weak in RLE that his knee cont to buckle requiring total assist x2 to maintain standing.   ADL Comments: Pt does well with adls in sitting.  Pt needs a great amount of assist to do anyting at all in standing due to weak quads (lack of pain sensation??) in RLE.      OT Diagnosis: Generalized weakness;Cognitive deficits  OT Problem List: Decreased strength;Decreased activity tolerance;Impaired balance (sitting and/or standing);Decreased cognition;Decreased knowledge of use of DME or AE;Decreased knowledge of precautions OT Treatment Interventions: Self-care/ADL training;DME and/or AE instruction;Therapeutic activities   OT Goals(Current goals can be found in the care plan section) Acute Rehab  OT Goals Patient Stated Goal: to go home. OT Goal Formulation: With patient Time For Goal Achievement: 06/21/13 Potential to Achieve Goals: Fair ADL  Goals Pt Will Perform Grooming: with min guard assist;standing Pt Will Perform Lower Body Bathing: with min assist;sit to/from stand Pt Will Perform Lower Body Dressing: with min assist;sit to/from stand Pt Will Transfer to Toilet: with mod assist;stand pivot transfer;ambulating;bedside commode Pt Will Perform Toileting - Clothing Manipulation and hygiene: with min assist;sit to/from stand Pt Will Perform Tub/Shower Transfer: with mod assist;shower seat;rolling walker;Shower transfer  Visit Information  Last OT Received On: 06/07/13 Assistance Needed: +2 PT/OT Co-Evaluation/Treatment: Yes History of Present Illness: Pt is an 79 yo male admitted for R TKA. Pt underwent L TKA two years ago.       Prior Functioning     Home Living Family/patient expects to be discharged to:: Private residence Living Arrangements: Alone Available Help at Discharge: Friend(s);Personal care attendant;Available 24 hours/day Type of Home: House Home Access: Ramped entrance Home Layout: Two level;Other (Comment) (has a chair lift to second floor.) Alternate Level Stairs-Number of Steps: chair lift Home Equipment: Shower seat;Walker - 2 wheels;Walker - standard;Bedside commode;Wheelchair - manual Additional Comments: Pt has two walkers. One upstairs and one down. Prior Function Level of Independence: Independent with assistive device(s) Comments: Pt has family friend that has assited him for years but he does most adls etc on his own. Communication Communication: Other (comment) (pt dysarthric PTA) Dominant Hand: Right         Vision/Perception Vision - History Baseline Vision: Wears glasses all the time Patient Visual Report: No change from baseline Vision - Assessment Vision Assessment: Vision not tested   Cognition  Cognition Arousal/Alertness: Awake/alert Behavior During Therapy: Impulsive Overall Cognitive Status: History of cognitive impairments - at baseline Memory: Decreased recall  of precautions;Decreased short-term memory    Extremity/Trunk Assessment Upper Extremity Assessment Upper Extremity Assessment: Overall WFL for tasks assessed Lower Extremity Assessment Lower Extremity Assessment: Defer to PT evaluation Cervical / Trunk Assessment Cervical / Trunk Assessment: Normal     Mobility Bed Mobility Bed Mobility: Supine to Sit;Sitting - Scoot to Edge of Bed Supine to Sit: 3: Mod assist;HOB elevated;With rails Sitting - Scoot to Edge of Bed: 4: Min assist Details for Bed Mobility Assistance: Pt required max assist to move R leg and pt had difficulty sitting foward without assist. Transfers Transfers: Sit to Stand;Stand to Sit Sit to Stand: 1: +2 Total assist;From bed;From elevated surface Sit to Stand: Patient Percentage: 40% Stand to Sit: 1: +2 Total assist;To chair/3-in-1 Stand to Sit: Patient Percentage: 10% Details for Transfer Assistance: Pt very difficult to transfer at this time due to weakness in RLE.  R knee immobilizer donned to assist and pt still required +2 assist.     Exercise     Balance     End of Session OT - End of Session Equipment Utilized During Treatment: Rolling walker;Right knee immobilizer Activity Tolerance: Patient limited by fatigue Patient left: in chair;with call bell/phone within reach;with family/visitor present Nurse Communication: Mobility status;Need for lift equipment;Weight bearing status  GO     Hope Budds 06/07/2013, 11:02 AM [phone]

## 2013-06-07 NOTE — Progress Notes (Signed)
ANTICOAGULATION CONSULT NOTE - Follow Up Consult  Pharmacy Consult for Warfarin Indication: Hx of Afib, chronic Warfarin VTE prophylaxis s/p R TKR  Allergies  Allergen Reactions  . Hydrocodone Other (See Comments)    Extreme confusion  . Penicillins Hives   Patient Measurements: Height: 5' 8.11" (173 cm) Weight: 235 lb 0.2 oz (106.6 kg) IBW/kg (Calculated) : 68.65  Vital Signs: Temp: 98 F (36.7 C) (11/11 0931) Temp src: Oral (11/11 0612) BP: 100/49 mmHg (11/11 0931) Pulse Rate: 65 (11/11 0931)  Labs:  Recent Labs  06/06/13 0730 06/06/13 1538 06/07/13 0445  HGB  --  9.7* 7.4*  HCT  --  29.5* 22.7*  PLT  --  136* 114*  LABPROT 16.3*  --  17.7*  INR 1.34  --  1.50*  CREATININE  --  4.11* 4.83*   Estimated Creatinine Clearance: 13.5 ml/min (by C-G formula based on Cr of 4.83).  Medications:  Scheduled:  . allopurinol  100 mg Oral Daily  . [START ON 06/08/2013] amiodarone  100 mg Oral Q M,W,F  . amLODipine  5 mg Oral BID  . atorvastatin  10 mg Oral QHS  . docusate sodium  100 mg Oral BID  . enoxaparin (LOVENOX) injection  30 mg Subcutaneous Q24H  . ferrous sulfate  325 mg Oral TID PC  . furosemide  40 mg Oral BID WC  . medroxyPROGESTERone  5 mg Oral BID  . metoprolol succinate  100 mg Oral Daily  . mirtazapine  15 mg Oral QHS  . polyethylene glycol  17 g Oral BID  . traMADol  50-100 mg Oral Q12H  . Warfarin - Pharmacist Dosing Inpatient   Does not apply q1800   Assessment: 79 yo M s/p R TKa on 06/06/13, with history of A.Fib on chronic anticoagulation with warfarin- home dose 5mg  on Sun,Wed; 2.5mg  other days with last dose 11/4. Resume warfarin post-operatively.   INR 1.50 after 5mg  Warfarin Lovenox 30mg  daily ordered until INR is >/= 1.8 to start on 11/11 -  adjusted for renal insufficiency   Goal of Therapy:  INR 2-3   Plan:   Warfarin 5mg  today  Daily PT/INR  Lovenox 30mg  daily till INR 1.8 or greater (monitor renal function)  Otho Bellows  PharmD Pager [phone] 06/07/2013, 10:19 AM

## 2013-06-07 NOTE — Evaluation (Signed)
Physical Therapy Evaluation Patient Details Name: Steven [name] MRN: [id number] DOB: 1927-05-10 Today's Date: 06/07/2013 Time: 8469-6295 PT Time Calculation (min): 27 min  PT Assessment / Plan / Recommendation History of Present Illness  Pt is an 79 yo male admitted for R TKA. Pt underwent L TKA two years ago. R knee buckling, decreased quad strength.  Clinical Impression  Pt  Unable to bear weight through R knee, KI did not provide sufficient support. RN aware and has contacted MD. Pt may require SNF rehab at this time. Pt will benefit from PT while in acute care.    PT Assessment  Patient needs continued PT services    Follow Up Recommendations  SNF    Does the patient have the potential to tolerate intense rehabilitation      Barriers to Discharge Decreased caregiver support      Equipment Recommendations  None recommended by PT    Recommendations for Other Services     Frequency 7X/week    Precautions / Restrictions Precautions Precautions: Knee;Fall Precaution Comments: Pt fall risk.  Pt appears to not be moving RLE actively much at all in quad area.  Pt with no pain and even flexed knee (by gravity assisting) past 90 degrees and had no pain. Pt unable to take any weight onto R side without leg buckling.  Make sure to donn KI. Required Braces or Orthoses: Knee Immobilizer - Right Knee Immobilizer - Right: Other (comment) (not ordered but used due to quad weakness.) Restrictions Weight Bearing Restrictions: No   Pertinent Vitals/Pain Reports no pain.      Mobility  Bed Mobility Bed Mobility: Supine to Sit;Sitting - Scoot to Edge of Bed Supine to Sit: 3: Mod assist;HOB elevated;With rails Sitting - Scoot to Edge of Bed: 4: Min assist Details for Bed Mobility Assistance: Pt required max assist to move R leg and pt had difficulty sitting foward without assist. Transfers Transfers: Sit to Stand;Stand to Sit Sit to Stand: 1: +2 Total assist;From bed;From elevated  surface Sit to Stand: Patient Percentage: 40% Stand to Sit: 1: +2 Total assist;To chair/3-in-1 Stand to Sit: Patient Percentage: 10% Details for Transfer Assistance: Pt very difficult to transfer at this time due to weakness in RLE.  R knee immobilizer donned to assist and pt still required +2 assist. KNee continued to buckle, even with bracing of knee.  Ambulation/Gait Ambulation/Gait Assistance: 1: +2 Total assist Ambulation/Gait: Patient Percentage: 50% Ambulation Distance (Feet): 4 Feet Assistive device: Rolling walker Ambulation/Gait Assistance Details: Pt extremely difficult to take any steps to get to recliner. Pt nearly buckled as he neared the recliner. Gait Pattern: Step-to pattern;Decreased stance time - right    Exercises     PT Diagnosis: Difficulty walking;Generalized weakness  PT Problem List: Decreased strength;Decreased range of motion;Decreased activity tolerance;Decreased mobility;Decreased knowledge of precautions;Decreased safety awareness;Decreased knowledge of use of DME PT Treatment Interventions: DME instruction;Gait training;Functional mobility training;Therapeutic activities;Therapeutic exercise;Patient/family education     PT Goals(Current goals can be found in the care plan section) Acute Rehab PT Goals Patient Stated Goal: to go home. PT Goal Formulation: With patient/family Time For Goal Achievement: 06/14/13 Potential to Achieve Goals: Good  Visit Information  Last PT Received On: 06/07/13 Assistance Needed: +2 History of Present Illness: Pt is an 79 yo male admitted for R TKA. Pt underwent L TKA two years ago. R knee buckling, decreased quad strength.       Prior Functioning  Home Living Family/patient expects to be discharged to:: Private residence  Living Arrangements: Alone Available Help at Discharge: Friend(s);Personal care attendant;Available 24 hours/day Type of Home: House Home Access: Ramped entrance Home Layout: Two level;Other  (Comment) (has a chair lift to second floor.) Alternate Level Stairs-Number of Steps: chair lift Home Equipment: Shower seat;Walker - 2 wheels;Walker - standard;Bedside commode;Wheelchair - manual Additional Comments: Pt has two walkers. One upstairs and one down. Prior Function Level of Independence: Independent with assistive device(s) Comments: Pt has family friend that has assited him for years but he does most adls etc on his own. Communication Communication: Other (comment) (pt dysarthric PTA) Dominant Hand: Right    Cognition  Cognition Arousal/Alertness: Awake/alert Behavior During Therapy: Impulsive Overall Cognitive Status: History of cognitive impairments - at baseline Memory: Decreased recall of precautions;Decreased short-term memory    Extremity/Trunk Assessment Upper Extremity Assessment Upper Extremity Assessment: Overall WFL for tasks assessed Lower Extremity Assessment Lower Extremity Assessment: RLE deficits/detail RLE Deficits / Details: pt has no quad activity noted, knee flexed to at least 110 degrees sitting on the edge of the bed when pt atempted to extend the knee. Pt did not c/o of pain when knee flexed . Pt reports he feels light touch  Cervical / Trunk Assessment Cervical / Trunk Assessment: Normal   Balance    End of Session PT - End of Session Equipment Utilized During Treatment: Gait belt;Right knee immobilizer Activity Tolerance: Patient limited by fatigue;Treatment limited secondary to medical complications (Comment) (weakness of R knee) Patient left: in chair;with call bell/phone within reach Nurse Communication: Mobility status;Need for lift equipment (R knee weakness.)  GP     [name] 06/07/2013, 11:54 AM [name] PT [phone]

## 2013-06-07 NOTE — Progress Notes (Signed)
Clinical Social Work Department BRIEF PSYCHOSOCIAL ASSESSMENT 06/07/2013  Patient:  Steven [name], Steven [name]     Account Number:  [id number]     Admit date:  06/06/2013  Clinical Social Worker:  Candie Chroman  Date/Time:  06/07/2013 02:43 PM  Referred by:  Physician  Date Referred:  06/07/2013 Referred for  SNF Placement   Other Referral:   Interview type:  Patient Other interview type:    PSYCHOSOCIAL DATA Living Status:  ALONE Admitted from facility:   Level of care:   Primary support name:  Kathie Rhodes [place] Primary support relationship to patient:   Degree of support available:   unclear    CURRENT CONCERNS Current Concerns  Post-Acute Placement   Other Concerns:    SOCIAL WORK ASSESSMENT / PLAN Pt is an 79 yr old gentleman living at home prior to hospitalization. CSW met with pt and caregiver to assist with d/c planning. Pt would like to go home with 24/7 assistance following hospital d/c. Caregiver feels she may be able to manage pt's care but will wait to see how pt does WED with PT. At this time, PT is recommending SNF placement. CSW will meet again with pt / caregiver in the am to continue assisting with d/c planning.   Assessment/plan status:  Psychosocial Support/Ongoing Assessment of Needs Other assessment/ plan:   Home vs SNF   Information/referral to community resources:   SNF list with bed offers will be provided if needed. Insurance coverage for SNF reviewed. Prior authorization is required with [facility].    PATIENT'S/FAMILY'S RESPONSE TO PLAN OF CARE: D/C plans are not finalized. Pt is hoping to return home but will consider [facility] if necessary.   Cori Razor LCSW [phone]

## 2013-06-07 NOTE — Progress Notes (Signed)
Physical Therapy Treatment Patient Details Name: Steven [name] MRN: [id number] DOB: [birth year] Today's Date: 06/07/2013 Time: 1443-1510 PT Time Calculation (min): 27 min  PT Assessment / Plan / Recommendation  History of Present Illness Pt is an 79 yo male admitted for R TKA. Pt underwent L TKA two years ago. R knee buckling, decreased quad strength.   PT Comments   R quads remain very weak. + 2 for transfes, KI is not supported.  Follow Up Recommendations  SNF     Does the patient have the potential to tolerate intense rehabilitation     Barriers to Discharge        Equipment Recommendations  None recommended by PT    Recommendations for Other Services    Frequency 7X/week   Progress towards PT Goals Progress towards PT goals: Not progressing toward goals - comment (R quads weaker than expected)  Plan Current plan remains appropriate    Precautions / Restrictions Precautions Precautions: Knee;Fall Precaution Comments: Quads remain weak on R Required Braces or Orthoses: Knee Immobilizer - Right   Pertinent Vitals/Pain Pt reports no PAIN    Mobility  Bed Mobility Bed Mobility: Sit to Supine Details for Bed Mobility Assistance: assist RLE onto bed. Transfers Transfers: Sit to Stand;Stand to Sit;Stand Pivot Transfers Sit to Stand: From elevated surface;1: +2 Total assist;From chair/3-in-1;With upper extremity assist Sit to Stand: Patient Percentage: 40% Stand to Sit: 1: +2 Total assist;To bed;With upper extremity assist Stand Pivot Transfers: 1: +2 Total assist Stand Pivot Transfers: Patient Percentage: 50% Details for Transfer Assistance: Pt very difficult to transfer at this time due to weakness in RLE.  R knee immobilizer donned to assist and pt still required +2 assist. KNee continued to buckle, even with bracing of knee.     Exercises Total Joint Exercises Hip ABduction/ADduction: AAROM;Right;5 reps Pt unable to initiate quads.   PT Diagnosis:    PT Problem  List:   PT Treatment Interventions:     PT Goals (current goals can now be found in the care plan section)    Visit Information  Last PT Received On: 06/07/13 Assistance Needed: +2 History of Present Illness: Pt is an 79 yo male admitted for R TKA. Pt underwent L TKA two years ago. R knee buckling, decreased quad strength.    Subjective Data      Cognition  Cognition Arousal/Alertness: Awake/alert    Balance     End of Session PT - End of Session Equipment Utilized During Treatment: Gait belt;Right knee immobilizer Activity Tolerance: Patient limited by fatigue;Treatment limited secondary to medical complications (Comment) Patient left: with call bell/phone within reach;in bed Nurse Communication: Mobility status (R quads remain very weak and not activated.)   GP     [name] 06/07/2013, 4:49 PM [name] PT [phone]

## 2013-06-08 ENCOUNTER — Encounter (HOSPITAL_COMMUNITY): Payer: Self-pay | Admitting: Interventional Cardiology

## 2013-06-08 DIAGNOSIS — E669 Obesity, unspecified: Secondary | ICD-10-CM | POA: Insufficient documentation

## 2013-06-08 DIAGNOSIS — I119 Hypertensive heart disease without heart failure: Secondary | ICD-10-CM | POA: Insufficient documentation

## 2013-06-08 DIAGNOSIS — I059 Rheumatic mitral valve disease, unspecified: Secondary | ICD-10-CM | POA: Insufficient documentation

## 2013-06-08 DIAGNOSIS — D62 Acute posthemorrhagic anemia: Secondary | ICD-10-CM

## 2013-06-08 DIAGNOSIS — I4891 Unspecified atrial fibrillation: Secondary | ICD-10-CM

## 2013-06-08 DIAGNOSIS — I10 Essential (primary) hypertension: Secondary | ICD-10-CM

## 2013-06-08 LAB — BASIC METABOLIC PANEL
CO2: 21 mEq/L (ref 19–32)
Chloride: 102 mEq/L (ref 96–112)
Creatinine, Ser: 4.94 mg/dL — ABNORMAL HIGH (ref 0.50–1.35)
GFR calc non Af Amer: 10 mL/min — ABNORMAL LOW (ref >90)
Glucose, Bld: 134 mg/dL — ABNORMAL HIGH (ref 70–99)
Potassium: 4.9 mEq/L (ref 3.5–5.1)
Sodium: 136 mEq/L (ref 135–145)

## 2013-06-08 LAB — CBC
HCT: 28.8 % — ABNORMAL LOW (ref 39.0–52.0)
Hemoglobin: 9.8 g/dL — ABNORMAL LOW (ref 13.0–17.0)
MCH: 31 pg (ref 26.0–34.0)
MCHC: 34 g/dL (ref 30.0–36.0)
MCV: 91.1 fL (ref 78.0–100.0)
RBC: 3.16 MIL/uL — ABNORMAL LOW (ref 4.22–5.81)

## 2013-06-08 LAB — TYPE AND SCREEN
Unit division: 0
Unit division: 0

## 2013-06-08 LAB — PROTIME-INR: INR: 2.23 — ABNORMAL HIGH (ref 0.00–1.49)

## 2013-06-08 MED ORDER — AMIODARONE HCL 200 MG PO TABS
200.0000 mg | ORAL_TABLET | Freq: Every day | ORAL | Status: DC
Start: 2013-06-09 — End: 2013-06-09
  Filled 2013-06-08: qty 1

## 2013-06-08 MED ORDER — DILTIAZEM HCL 100 MG IV SOLR
5.0000 mg/h | INTRAVENOUS | Status: DC
  Administered 2013-06-08: 5 mg/h via INTRAVENOUS
  Administered 2013-06-09: 20 mg/h via INTRAVENOUS
  Filled 2013-06-08: qty 100

## 2013-06-08 MED ORDER — DILTIAZEM LOAD VIA INFUSION
10.0000 mg | Freq: Once | INTRAVENOUS | Status: AC
  Administered 2013-06-08: 10 mg via INTRAVENOUS
  Filled 2013-06-08: qty 10

## 2013-06-08 NOTE — Progress Notes (Signed)
1440 patients pulse 116, no c/o chest pain or shortness of breath, ekg done, [employer] PA paged.

## 2013-06-08 NOTE — Progress Notes (Signed)
Occupational Therapy Treatment Patient Details Name: Steven [name] MRN: [id number] DOB: [birth year] Today's Date: 06/08/2013 Time: 2130-8657 OT Time Calculation (min): 26 min  OT Assessment / Plan / Recommendation  History of present illness Pt is an 79 yo male admitted for R TKA. Pt underwent L TKA two years ago. R knee buckling, decreased quad strength.   OT comments  Improvement from yesterday but still +2 for safety.    Follow Up Recommendations  SNF (would benefit from snf; 24/7 if home)    Barriers to Discharge       Equipment Recommendations  None recommended by OT    Recommendations for Other Services    Frequency Min 2X/week   Progress towards OT Goals Progress towards OT goals: Progressing toward goals  Plan      Precautions / Restrictions Precautions Precautions: Knee;Fall Required Braces or Orthoses: Knee Immobilizer - Right Restrictions Weight Bearing Restrictions: No   Pertinent Vitals/Pain No c/o pain.  Sats 94% on RA, dyspnea 2/4    ADL  Toilet Transfer: +2 Total assistance Toilet Transfer: Patient Percentage: 60% Toilet Transfer Method: Sit to stand Toilet Transfer Equipment: Comfort height toilet;Grab bars Equipment Used: Rolling walker Transfers/Ambulation Related to ADLs: ambulated to bathroom with A x 2 for safety.  Pt had difficulty following verbal cues:  multimodal helped but still with safety concerns with walker.  Pt tended to keep walker too far from body, moved it so body was not contained within frame.  Pt states he won't use 3:1 at home, but caregiver confirms that they have been using it.   ADL Comments: pt was independent with adls prior to admission, but will need some assist initially    OT Diagnosis:    OT Problem List:   OT Treatment Interventions:     OT Goals(current goals can now be found in the care plan section)    Visit Information  Last OT Received On: 06/08/13 Assistance Needed: +2 PT/OT Co-Evaluation/Treatment:  Yes History of Present Illness: Pt is an 79 yo male admitted for R TKA. Pt underwent L TKA two years ago.     Subjective Data      Prior Functioning       Cognition  Cognition Arousal/Alertness: Awake/alert Behavior During Therapy: Impulsive Overall Cognitive Status: History of cognitive impairments - at baseline Memory: Decreased recall of precautions;Decreased short-term memory (decreased safety)    Mobility  Transfers Sit to Stand: 1: +2 Total assist;With armrests;From toilet (70% from recliner) Sit to Stand: Patient Percentage: 50% Stand to Sit: 1: +2 Total assist;To toilet;With armrests Stand to Sit: Patient Percentage: 60% Details for Transfer Assistance: multimodal cues and assist for RLE    Exercises      Balance     End of Session OT - End of Session Activity Tolerance: Patient limited by fatigue Patient left: in chair;with call bell/phone within reach;with family/visitor present  GO     Chanette Demo 06/08/2013, 10:23 AM Marica Otter, OTR/L [phone] 06/08/2013

## 2013-06-08 NOTE — Progress Notes (Addendum)
ANTICOAGULATION CONSULT NOTE - Follow Up Consult  Pharmacy Consult for Warfarin Indication: Hx of Afib, chronic Warfarin VTE prophylaxis s/p R TKR  Allergies  Allergen Reactions  . Hydrocodone Other (See Comments)    Extreme confusion  . Penicillins Hives   Patient Measurements: Height: 5' 8.11" (173 cm) Weight: 235 lb 0.2 oz (106.6 kg) IBW/kg (Calculated) : 68.65  Vital Signs: Temp: 97.9 F (36.6 C) (11/12 0611) Temp src: Oral (11/12 0611) BP: 133/64 mmHg (11/12 0611) Pulse Rate: 69 (11/12 0611)  Labs:  Recent Labs  06/06/13 0730  06/06/13 1538 06/07/13 0445 06/08/13 0455  HGB  --   < > 9.7* 7.4* 9.8*  HCT  --   --  29.5* 22.7* 28.8*  PLT  --   --  136* 114* 114*  LABPROT 16.3*  --   --  17.7* 24.0*  INR 1.34  --   --  1.50* 2.23*  CREATININE  --   --  4.11* 4.83* 4.94*  < > = values in this interval not displayed. Estimated Creatinine Clearance: 13.2 ml/min (by C-G formula based on Cr of 4.94).  Medications:  Scheduled:  . allopurinol  100 mg Oral Daily  . amiodarone  100 mg Oral Q M,W,F  . amLODipine  5 mg Oral BID  . atorvastatin  10 mg Oral QHS  . docusate sodium  100 mg Oral BID  . enoxaparin (LOVENOX) injection  30 mg Subcutaneous Q24H  . ferrous sulfate  325 mg Oral TID PC  . furosemide  40 mg Oral BID WC  . medroxyPROGESTERone  5 mg Oral BID  . metoprolol succinate  100 mg Oral Daily  . mirtazapine  15 mg Oral QHS  . polyethylene glycol  17 g Oral BID  . traMADol  50-100 mg Oral Q12H  . Warfarin - Pharmacist Dosing Inpatient   Does not apply q1800   Assessment: 79 yo M s/p R TKa on 06/06/13, with history of A.Fib on chronic anticoagulation with warfarin- home dose 5mg  on Sun,Wed; 2.5mg  other days with last dose 11/4. Resume warfarin post-operatively.   INR has risen very quickly after only 2 doses of warfarin inpatient from 1.34-->2.23.  Will hold dose tonight and d/c lovenox.   CBC: Hgb improved today after 2 units PRBCs   AoCKD-IV (baseline  appears 1.8-2.2). SCr continues to rise, today 4.94. CrCl ~13  Goal of Therapy:  INR 2-3   Plan:  Hold warfarin today.  D/C Lovenox F/u daily PT/INR, CBC  Haynes Hoehn, PharmD 06/08/2013, 1:08 PM  Pager: [phone]

## 2013-06-08 NOTE — Progress Notes (Signed)
1430 matt babish pa returned call, patients cardiologist being consulted.  Patients pulse continues  110 120 on pulse ox  [employer]

## 2013-06-08 NOTE — Progress Notes (Addendum)
Physical Therapy Treatment Patient Details Name: ADRIK KHIM MRN: [id number] DOB: 20-Sep-1926 Today's Date: 06/08/2013 Time: 6962-9528 PT Time Calculation (min): 25 min  PT Assessment / Plan / Recommendation  History of Present Illness Pt is an 79 yo male admitted for R TKA. Pt underwent L TKA two years ago. .   PT Comments   Pt is able to perform SLR today. Pt remains unsafe when amblulation. Recommend SNF  Follow Up Recommendations  SNF     Does the patient have the potential to tolerate intense rehabilitation     Barriers to Discharge        Equipment Recommendations  None recommended by PT    Recommendations for Other Services    Frequency 7X/week   Progress towards PT Goals Progress towards PT goals: Progressing toward goals  Plan Current plan remains appropriate    Precautions / Restrictions Precautions Precautions: Knee;Fall Required Braces or Orthoses: Knee Immobilizer - Right Restrictions Weight Bearing Restrictions: No   Pertinent Vitals/Pain Reports no pain    Mobility  Bed Mobility Bed Mobility: Not assessed Transfers Sit to Stand: 1: +2 Total assist;With armrests;From toilet Sit to Stand: Patient Percentage: 50% Stand to Sit: 1: +2 Total assist;To toilet;With armrests Stand to Sit: Patient Percentage: 60% Details for Transfer Assistance: multimodal cues and assist for RLE, decreased descent to toilet., mutiple cues for safety. Ambulation/Gait Ambulation/Gait Assistance: 1: +2 Total assist Ambulation/Gait: Patient Percentage: 60% Ambulation Distance (Feet): 20 Feet (x2) Assistive device: Rolling walker Ambulation/Gait Assistance Details: Pt with decreased control of R leg placement after stepping forward. Decreased dorsiflexion during swing, pt tends to place RW too far forward. Gait Pattern: Step-to pattern;Decreased stance time - right    Exercises  SLR x 10 with assist,QS  X 10, LAQ AA x 10   PT Diagnosis:    PT Problem List:   PT Treatment  Interventions:     PT Goals (current goals can now be found in the care plan section)    Visit Information  Last PT Received On: 06/08/13 Assistance Needed: +2 History of Present Illness: Pt is an 79 yo male admitted for R TKA. Pt underwent L TKA two years ago. .    Subjective Data      Cognition  Cognition Arousal/Alertness: Awake/alert Behavior During Therapy: Impulsive Overall Cognitive Status: History of cognitive impairments - at baseline Memory: Decreased recall of precautions;Decreased short-term memory    Balance  Balance Balance Assessed: Yes Static Standing Balance Static Standing - Balance Support: Bilateral upper extremity supported Static Standing - Level of Assistance: 4: Min assist;3: Mod assist Static Standing - Comment/# of Minutes: will lose balance if distracted.  End of Session PT - End of Session Equipment Utilized During Treatment: Gait belt;Right knee immobilizer Activity Tolerance: Patient limited by fatigue;Treatment limited secondary to medical complications (Comment) Patient left: with call bell/phone within reach;in bed Nurse Communication: Mobility status   GP     [name] 06/08/2013, 1:28 PM

## 2013-06-08 NOTE — Social Work (Signed)
Met with patient and provided him with SNF bed offers rec'd thus far- he is still leaning towards going home- friend, Kathie Rhodes, at bedside and states she can assist him at home and has observed him up with PT. They are still considering SNF and thus I reviewed offers with them- they will review and CSW will f/u again to further discuss d/c options and disposition.   Reece Levy, MSW, Theresia Majors [phone]

## 2013-06-08 NOTE — Progress Notes (Signed)
   Subjective: 1 Day Post-Op Procedure(s) (LRB): RIGHT TOTAL KNEE ARTHROPLASTY (Right)   Patient reports pain as mild, pain well controlled. Low energy and feels weak. No events throughout the night.   Objective:   VITALS:   Filed Vitals:   06/07/13   BP: 111/64  Pulse: 60  Temp: 98.6 F (37 C)   Resp: 14    Neurovascular intact Dorsiflexion/Plantar flexion intact Incision: dressing C/D/I No cellulitis present Compartment soft  LABS  Recent Labs  06/06/13 1538 06/07/13 0445  HGB 9.7* 7.4*  HCT 29.5* 22.7*  WBC 8.1 8.5  PLT 136* 114*     Recent Labs  06/06/13 1538 06/07/13 0445  NA  --  137  K  --  5.0  BUN  --  77*  CREATININE 4.11* 4.83*  GLUCOSE  --  162*     Assessment/Plan: 1 Day Post-Op Procedure(s) (LRB): RIGHT TOTAL KNEE ARTHROPLASTY (Right) Maintain foley cath Advance diet Up with therapy D/C IV fluids Discharge home with home health vs SNF  ABLA  Ordered 2 units of blood and will observe  Obese (BMI 30-39.9) Estimated body mass index is 35.62 kg/(m^2) as calculated from the following:   Height as of this encounter: 5' 8.11" (1.73 m).   Weight as of this encounter: 106.6 kg (235 lb 0.2 oz). Patient also counseled that weight may inhibit the healing process Patient counseled that losing weight will help with future health issues     Anastasio Auerbach. Shawny Borkowski   PAC  06/08/2013, 3:33 PM

## 2013-06-08 NOTE — Progress Notes (Signed)
Patient transferred via bed to ICU 1230 in stable condition.  Report given to [facility], Charity fundraiser.

## 2013-06-08 NOTE — Progress Notes (Signed)
[facility]  [facility] is providing the following services: patient declined rw and commode  If patient discharges after hours, please call [phone].   Renard Hamper 06/08/2013, 9:21 AM

## 2013-06-08 NOTE — Progress Notes (Signed)
Physical Therapy Treatment Patient Details Name: Steven [name] MRN: [id number] DOB: [birth year] Today's Date: 06/08/2013 Time: 1478-2956 PT Time Calculation (min): 14 min  PT Assessment / Plan / Recommendation  History of Present Illness Pt is an 79 yo male admitted for R TKA. Pt underwent L TKA two years ago. .   PT Comments   Pt has had increased HR. Up to 130 during mobility. RN is aware. An EKG has been performed. Pt noted to be more dyspneic this session. Plans SNF  Follow Up Recommendations  SNF     Does the patient have the potential to tolerate intense rehabilitation     Barriers to Discharge        Equipment Recommendations  None recommended by PT    Recommendations for Other Services    Frequency 7X/week   Progress towards PT Goals Progress towards PT goals: Progressing toward goals  Plan Current plan remains appropriate    Precautions / Restrictions Precautions Precautions: Knee;Fall Required Braces or Orthoses: Knee Immobilizer - Right   Pertinent Vitals/Pain Does not report pain    Mobility  Bed Mobility Bed Mobility: Not assessed Sit to Supine: 3: Mod assist Details for Bed Mobility Assistance: assist RLE onto bed. Transfers Sit to Stand: 1: +2 Total assist;From chair/3-in-1;With upper extremity assist Sit to Stand: Patient Percentage: 50% Stand to Sit: 1: +2 Total assist;With armrests;To bed;With upper extremity assist Stand to Sit: Patient Percentage: 50% Stand Pivot Transfers: 1: +2 Total assist Stand Pivot Transfers: Patient Percentage: 60% Details for Transfer Assistance: multimodal cues and assist for RLE, decreased descent to toilet., mutiple cues for safety.pt very ubsteady, frequent cues to stay on task. Ambulation/Gait Ambulation/Gait Assistance: Not tested (comment) Ambulation/Gait: Patient Percentage: 60% Ambulation Distance (Feet): 20 Feet (x2) Assistive device: Rolling walker Ambulation/Gait Assistance Details: Pt with decreased  control of R leg placement after stepping forward. Decreased dorsiflexion during swing, pt tends to place RW too far forward. Gait Pattern: Step-to pattern;Decreased stance time - right    Exercises     PT Diagnosis:    PT Problem List:   PT Treatment Interventions:     PT Goals (current goals can now be found in the care plan section)    Visit Information  Last PT Received On: 06/08/13 Assistance Needed: +2 History of Present Illness: Pt is an 79 yo male admitted for R TKA. Pt underwent L TKA two years ago. .    Subjective Data      Cognition  Cognition Arousal/Alertness: Awake/alert Behavior During Therapy: Impulsive Overall Cognitive Status: History of cognitive impairments - at baseline Memory: Decreased recall of precautions;Decreased short-term memory    Balance  Balance Balance Assessed: Yes Static Standing Balance Static Standing - Balance Support: Bilateral upper extremity supported Static Standing - Level of Assistance: 4: Min assist;3: Mod assist Static Standing - Comment/# of Minutes: will lose balance if distracted.  End of Session PT - End of Session Equipment Utilized During Treatment: Right knee immobilizer Activity Tolerance: Patient limited by fatigue Patient left: in bed;with call bell/phone within reach;with family/visitor present;with nursing/sitter in room Nurse Communication: Mobility status (HR 130)   GP     [name] 06/08/2013, 3:32 PM

## 2013-06-08 NOTE — Progress Notes (Signed)
   Subjective: 2 Days Post-Op Procedure(s) (LRB): RIGHT TOTAL KNEE ARTHROPLASTY (Right)   Patient reports pain as mild, pain well controlled. No events throughout the night. Decreased quad function reported by PT from sessions yesterday.  Today the quad function appears to have returned. Still needing a lot of assistance. We have discussed seeing how he does with PT today. If well then possibly discharge home, if not plan to discharge to SNF for additional assistance until safe to return home.  Objective:   VITALS:   Filed Vitals:   06/08/13 0611  BP: 133/64  Pulse: 69  Temp: 97.9 F (36.6 C)  Resp: 18    Neurovascular intact Dorsiflexion/Plantar flexion intact Incision: dressing C/D/I No cellulitis present Compartment soft  LABS  Recent Labs  06/06/13 1538 06/07/13 0445 06/08/13 0455  HGB 9.7* 7.4* 9.8*  HCT 29.5* 22.7* 28.8*  WBC 8.1 8.5 15.1*  PLT 136* 114* 114*     Recent Labs  06/06/13 1538 06/07/13 0445 06/08/13 0455  NA  --  137 136  K  --  5.0 4.9  BUN  --  77* 80*  CREATININE 4.11* 4.83* 4.94*  GLUCOSE  --  162* 134*     Assessment/Plan: 2 Days Post-Op Procedure(s) (LRB): RIGHT TOTAL KNEE ARTHROPLASTY (Right) Up with therapy Discharge home with home health vs SNF, eventually when ready  ABLA  Treated with 2 units of blood yesterday, will observe. Feeling much better today.  Obese (BMI 30-39.9)  Estimated body mass index is 35.62 kg/(m^2) as calculated from the following:   Height as of this encounter: 5' 8.11" (1.73 m).   Weight as of this encounter: 106.6 kg (235 lb 0.2 oz). Patient also counseled that weight may inhibit the healing process Patient counseled that losing weight will help with future health issues      Anastasio Auerbach. Idil Maslanka   PAC  06/08/2013, 9:57 AM

## 2013-06-08 NOTE — Consult Note (Addendum)
Admit date: 06/06/2013 Referring Physician  Dr. Charlann Boxer Primary Cardiologist  Dr. Donnie Aho Reason for Consultation  Atrial fibrillation  HPI: 79 y/o with a h/o PAF who had knee replacement 2 days ago.  Earlier today, he was noted to be in AFib with RVR.  No CP or palpitations.  He has been on Coumadin for AFib prior to this hospital stay.  He follows with Dr. Donnie Aho for his cardiac care.  Echo in 2012 showed normal LV function and mild to moderate MR.  LVH as well.  He had a blood transfusion yesterday.        PMH:   Past Medical History  Diagnosis Date  . Irregular heartbeat   . Gout   . Hypertension   . Prostate ca   . Osteoarthritis   . PVC (premature ventricular contraction)   . HX: long term anticoagulant use   . Kidney stone   . CKD (chronic kidney disease), stage IV   . Anemia   . Hypercalcemia   . Elevated cholesterol   . Vitamin D deficiency   . Interstitial nephritis      PSH:   Past Surgical History  Procedure Laterality Date  . Kidney stone surgery    . Joint replacement Left 2012    knee  . Eye surgery Bilateral     cataract extraction with IOL  . Sp av dialysis shunt access existing *l* Left     no dialysis  . Total knee arthroplasty Right 06/06/2013    Procedure: RIGHT TOTAL KNEE ARTHROPLASTY;  Surgeon: [name], MD;  Location: [facility]S;  Service: Orthopedics;  Laterality: Right;    Allergies:  Hydrocodone and Penicillins Prior to Admit Meds:   Prescriptions prior to admission  Medication Sig Dispense Refill  . acetaminophen (TYLENOL) 500 MG tablet Take 1,000 mg by mouth every 6 (six) hours as needed for pain.      Marland Kitchen allopurinol (ZYLOPRIM) 100 MG tablet Take 100 mg by mouth daily.       Marland Kitchen amiodarone (PACERONE) 200 MG tablet Take 100 mg by mouth daily. Take half a pill mon, wed, fri      . amLODipine (NORVASC) 5 MG tablet Take 5 mg by mouth 2 (two) times daily.       Marland Kitchen atorvastatin (LIPITOR) 10 MG tablet Take 10 mg by mouth at bedtime.       .  cholecalciferol (VITAMIN D) 1000 UNITS tablet Take 1,000 Units by mouth every evening.       . furosemide (LASIX) 40 MG tablet Take 40 mg by mouth 2 (two) times daily. Morning and LUNCH      . lisinopril (PRINIVIL,ZESTRIL) 20 MG tablet Take 20 mg by mouth daily.      . medroxyPROGESTERone (PROVERA) 5 MG tablet Take 5 mg by mouth 2 (two) times daily.       . metoprolol succinate (TOPROL-XL) 100 MG 24 hr tablet Take 100 mg by mouth daily. Take with or immediately following a meal.      . mirtazapine (REMERON) 15 MG tablet Take 15 mg by mouth at bedtime.       Marland Kitchen warfarin (COUMADIN) 5 MG tablet Take 5 mg by mouth every evening. Take 5Mg  on Sunday and wednsday , 2.5 mg every other day       Fam HX:   History reviewed. No pertinent family history. Social HX:    History   Social History  . Marital Status: Widowed    Spouse Name: N/A  Number of Children: N/A  . Years of Education: N/A   Occupational History  . Not on file.   Social History Main Topics  . Smoking status: Passive Smoke Exposure - Never Smoker    Types: Cigars  . Smokeless tobacco: Former Neurosurgeon  . Alcohol Use: Yes     Comment: attended [employer] 1976  . Drug Use: No  . Sexual Activity: Not on file   Other Topics Concern  . Not on file   Social History Narrative  . No narrative on file     ROS:  All 11 ROS were addressed and are negative except what is stated in the HPI  Physical Exam: Blood pressure 105/65, pulse 120, temperature 97.8 F (36.6 C), temperature source Oral, resp. rate 18, height 5' 8.11" (1.73 m), weight 235 lb 0.2 oz (106.6 kg), SpO2 94.00%.    General: Well developed, well nourished, in no acute distress Head:   Normal cephalic and atramatic  Lungs:  Clear bilaterally to auscultation and percussion. Heart:  Irregularly irregular, tachycardic S1 S2 Pulses are 2+ & equal.            No carotid bruit. No JVD.  No abdominal bruits. No femoral bruits. Abdomen: obese Msk:  Back normal, normal gait. Normal  strength and tone for age. Extremities:  Tr edema.   Neuro: Alert and oriented X 3. Psych:  Good affect, responds appropriately    Labs:   Lab Results  Component Value Date   WBC 15.1* 06/08/2013   HGB 9.8* 06/08/2013   HCT 28.8* 06/08/2013   MCV 91.1 06/08/2013   PLT 114* 06/08/2013    Recent Labs Lab 06/08/13 0455  NA 136  K 4.9  CL 102  CO2 21  BUN 80*  CREATININE 4.94*  CALCIUM 10.0  GLUCOSE 134*   No results found for this basename: PTT   Lab Results  Component Value Date   INR 2.23* 06/08/2013   INR 1.50* 06/07/2013   INR 1.34 06/06/2013   Lab Results  Component Value Date   CKTOTAL 56 04/01/2011   CKMB 1.7 04/01/2011   TROPONINI <0.30 04/01/2011     Lab Results  Component Value Date   CHOL 178 03/31/2011   Lab Results  Component Value Date   HDL 39* 03/31/2011   Lab Results  Component Value Date   LDLCALC 111* 03/31/2011   Lab Results  Component Value Date   TRIG 138 03/31/2011   Lab Results  Component Value Date   CHOLHDL 4.6 03/31/2011   No results found for this basename: LDLDIRECT      Radiology:  No results found.  EKG:  AFib with RVR, Left axis deviation  ASSESSMENT: Recurrent AFib- may be related to anemia, fluid shifts, transfusion.  PLAN:  Start IV cardizem for rate control.  Hopefully, he will convert on his own.  COntinue Toprol.  Move patient to ICU or stepdown due to lack of telemetry on this floor.  Will increase amiodarone to 200 mg daily.  THis dose may help him stay in NSR.  He is therapeutic on his coumadin at this time.    BP borderline.  Stop amlodipine at this time.  Continue Lasix to avoid issues with diastolic dysfunction.  WOuld not repeat echo at this time unless he has sx.  Dr. Donnie Aho to follow.   Corky Crafts., MD  06/08/2013  6:52 PM

## 2013-06-09 ENCOUNTER — Encounter (HOSPITAL_COMMUNITY): Payer: Self-pay | Admitting: Cardiology

## 2013-06-09 DIAGNOSIS — N184 Chronic kidney disease, stage 4 (severe): Secondary | ICD-10-CM | POA: Diagnosis present

## 2013-06-09 DIAGNOSIS — Z7901 Long term (current) use of anticoagulants: Secondary | ICD-10-CM

## 2013-06-09 DIAGNOSIS — E785 Hyperlipidemia, unspecified: Secondary | ICD-10-CM | POA: Insufficient documentation

## 2013-06-09 DIAGNOSIS — C61 Malignant neoplasm of prostate: Secondary | ICD-10-CM | POA: Insufficient documentation

## 2013-06-09 LAB — CBC
Hemoglobin: 9.7 g/dL — ABNORMAL LOW (ref 13.0–17.0)
Platelets: 127 10*3/uL — ABNORMAL LOW (ref 150–400)
RBC: 3.08 MIL/uL — ABNORMAL LOW (ref 4.22–5.81)
WBC: 12.9 10*3/uL — ABNORMAL HIGH (ref 4.0–10.5)

## 2013-06-09 LAB — BASIC METABOLIC PANEL
CO2: 23 mEq/L (ref 19–32)
Chloride: 103 mEq/L (ref 96–112)
GFR calc Af Amer: 14 mL/min — ABNORMAL LOW (ref >90)
Sodium: 135 mEq/L (ref 135–145)

## 2013-06-09 LAB — PROTIME-INR
INR: 2.34 — ABNORMAL HIGH (ref 0.00–1.49)
Prothrombin Time: 24.9 seconds — ABNORMAL HIGH (ref 11.6–15.2)

## 2013-06-09 MED ORDER — AMIODARONE HCL 200 MG PO TABS
400.0000 mg | ORAL_TABLET | Freq: Two times a day (BID) | ORAL | Status: DC
  Administered 2013-06-09 – 2013-06-14 (×11): 400 mg via ORAL
  Filled 2013-06-09 (×12): qty 2

## 2013-06-09 MED ORDER — WARFARIN SODIUM 1 MG PO TABS
1.5000 mg | ORAL_TABLET | Freq: Once | ORAL | Status: AC
  Administered 2013-06-09: 1.5 mg via ORAL
  Filled 2013-06-09: qty 1

## 2013-06-09 NOTE — Progress Notes (Signed)
Pt admitted from ICU. VSS stable, pt placed on tele, denies pain, call bell in reach, pt denies needs. Will continue to monitor. Steven [name] A

## 2013-06-09 NOTE — Progress Notes (Signed)
Subjective:  Patient is feeling better today. He is not currently short of breath but does complain of knee pain. He developed atrial fibrillation postoperatively. He was initially diagnosed in 2012 and he was cardioverted at that time and has been maintained on low-dose amiodarone. His rate is currently controlled. He was in sinus preoperatively.  Objective:  Vital Signs in the last 24 hours: BP 125/50  Pulse 74  Temp(Src) 97.8 F (36.6 C) (Oral)  Resp 22  Ht 5' 8.11" (1.73 m)  Wt 117.7 kg (259 lb 7.7 oz)  BMI 39.33 kg/m2  SpO2 97%  Physical Exam: Obese pleasant white male in no acute distress Lungs:  Clear  Cardiac: Irregular rhythm, normal S1-S2 no S3  Abdomen:  Soft, nontender, no masses Extremities:  No edema present  Intake/Output from previous day: 11/12 0701 - 11/13 0700 In: 827 [P.O.:480; I.V.:347] Out: 1600 [Urine:1600] Weight Filed Weights   06/06/13 1950 06/09/13 0400  Weight: 106.6 kg (235 lb 0.2 oz) 117.7 kg (259 lb 7.7 oz)    Lab Results: Basic Metabolic Panel:  Recent Labs  16/10/96 0455 06/09/13 0443  NA 136 135  K 4.9 5.0  CL 102 103  CO2 21 23  GLUCOSE 134* 117*  BUN 80* 75*  CREATININE 4.94* 4.06*    CBC:  Recent Labs  06/08/13 0455 06/09/13 0443  WBC 15.1* 12.9*  HGB 9.8* 9.7*  HCT 28.8* 28.5*  MCV 91.1 92.5  PLT 114* 127*    PROTIME: Lab Results  Component Value Date   INR 2.34* 06/09/2013   INR 2.23* 06/08/2013   INR 1.50* 06/07/2013    Telemetry: Atrial fibrillation with controlled response  Assessment/Plan:  1. Recurrent atrial fibrillation in a patient with a prior history of paroxysmal atrial fibrillation 2. Stage IV chronic kidney disease 3. Hypertensive heart disease 4. Postoperative hip replacement with postoperative anemia 5. Thrombocytopenia  Recommendations:  Increase amiodarone since he has been on low-dose at home. Continue warfarin indefinitely. I will get off of intravenous diltiazem. We may  consider cardioversion again if he fails to convert on a higher dose of amiodarone.   [name]  MD [facility] Cardiology  06/09/2013, 10:30 AM

## 2013-06-09 NOTE — Progress Notes (Signed)
Physical Therapy Treatment Patient Details Name: Steven [name] MRN: [id number] DOB: 01-Jun-1927 Today's Date: 06/09/2013 Time: 4098-1191 PT Time Calculation (min): 26 min  PT Assessment / Plan / Recommendation  History of Present Illness Pt is an 79 yo male admitted for R TKA. Pt underwent L TKA two years ago. .   PT Comments   Pt able to ambulate 15' with RW and +2 for safety and to manage lines. Pt also able to performeLE strengthening exercises.  However, pt limited by increased HR during all mobility.  PT educated pt on the importance of resting R knee in a straight position in order to maintain/improve R knee extension ROM.  Pt would benefit from skilled PT to improve functional mobility and safety.  Follow Up Recommendations  SNF     Does the patient have the potential to tolerate intense rehabilitation     Barriers to Discharge        Equipment Recommendations  None recommended by PT    Recommendations for Other Services    Frequency 7X/week   Progress towards PT Goals Progress towards PT goals: Progressing toward goals  Plan Current plan remains appropriate    Precautions / Restrictions Precautions Precautions: Knee;Fall Required Braces or Orthoses: Knee Immobilizer - Right Restrictions Weight Bearing Restrictions: No   Pertinent Vitals/Pain No c/o of pain/SOB during mobility. Pt positioned to comfort at end of session.    Mobility  Bed Mobility Bed Mobility: Supine to Sit;Sitting - Scoot to Edge of Bed Supine to Sit: 4: Min assist Sitting - Scoot to [place] of Bed: 4: Min guard Details for Bed Mobility Assistance: assist to guide RLE off EOB, min guard to ensure safety while pt scooting to EOB. pt's HR monitored during session, please see vitals section for details. pt required rest breaks during mobilty to allow HR to decrease. Transfers Transfers: Sit to Stand;Stand to Sit Sit to Stand: 4: Min assist;From elevated surface;From bed;With upper extremity  assist Stand to Sit: 4: Min assist;With upper extremity assist;With armrests;To chair/3-in-1 Details for Transfer Assistance: +2 assist to manage lines and to ensure safety. VC's for hand placment and to keep RLE extended during transfers to decrease pain. Ambulation/Gait Ambulation/Gait Assistance: 4: Min assist Ambulation Distance (Feet): 15 Feet Assistive device: Rolling walker Ambulation/Gait Assistance Details: min assist to steady pt during first 5' of ambulation with RW with pt progressing to min guard to ensure safety. pt's HR reached 128 bpm during ambulation, PT had pt cease amb and sit in recliner.  Gait Pattern: Step-to pattern;Decreased stance time - right;Antalgic;Trunk flexed;Decreased step length - right Gait velocity: decreased    Exercises General Exercises - Lower Extremity Ankle Circles/Pumps: AROM;20 reps;Supine;Right Quad Sets: AROM;20 reps;Supine;Right (pt performed all exercises in 2 sets of 10) Heel Slides: AROM;20 reps;Supine;Right (rest breaks required during all exercises to allow HR to decrease) Hip ABduction/ADduction: AROM;20 reps;Supine;Right Straight Leg Raises: AROM;Supine;Right;10 reps   PT Diagnosis:    PT Problem List:   PT Treatment Interventions:     PT Goals (current goals can now be found in the care plan section)    Visit Information  Last PT Received On: 06/09/13 Assistance Needed: +2 History of Present Illness: Pt is an 79 yo male admitted for R TKA. Pt underwent L TKA two years ago. .    Subjective Data      Cognition  Cognition Arousal/Alertness: Awake/alert Behavior During Therapy: WFL for tasks assessed/performed Overall Cognitive Status: History of cognitive impairments - at baseline  Balance     End of Session PT - End of Session Equipment Utilized During Treatment: Right knee immobilizer Activity Tolerance: Treatment limited secondary to medical complications (Comment) (pt's with increased HR during mobilty, RN  informed.) Patient left: in chair;with family/visitor present;with call bell/phone within reach Nurse Communication: Mobility status;Other (comment) (HR increased during mobility (up to 128 bpm during amb))   GP     [name] 06/09/2013, 12:48 PM

## 2013-06-09 NOTE — Progress Notes (Signed)
Patient ID: Steven [name], male   DOB: 03-20-1927, 79 y.o.   MRN: [id number] Subjective: 3 Days Post-Op Procedure(s) (LRB): RIGHT TOTAL KNEE ARTHROPLASTY (Right)   Events from yesterday reviewed.  In ICU stepdown for cardizem gtt.  No further events.  No complaints this am.  Comfortable Patient reports pain as mild tolerating post-operative course well other than rvr event yesterday  I appreciate cardiology input and treatment as his rate is at this point well controlled  Objective:   VITALS:   Filed Vitals:   06/09/13 0600  BP: 125/50  Pulse: 74  Temp:   Resp: 22    Neurovascular intact Incision: dressing C/D/I  LABS  Recent Labs  06/06/13 1538 06/07/13 0445 06/08/13 0455  HGB 9.7* 7.4* 9.8*  HCT 29.5* 22.7* 28.8*  WBC 8.1 8.5 15.1*  PLT 136* 114* 114*     Recent Labs  06/07/13 0445 06/08/13 0455 06/09/13 0443  NA 137 136 135  K 5.0 4.9 5.0  BUN 77* 80* 75*  CREATININE 4.83* 4.94* 4.06*  GLUCOSE 162* 134* 117*     Recent Labs  06/08/13 0455 06/09/13 0443  INR 2.23* 2.34*     Assessment/Plan: 3 Days Post-Op Procedure(s) (LRB): RIGHT TOTAL KNEE ARTHROPLASTY (Right)   Up with therapy Discharge to SNF  Given rate control with medication adjustment I am certain but will obviously leave final recs up to Dr. Donnie [name] whom Steven [name] likes a lot.   Would imagine that we would be able to transfer him out of unit today to tele with adjusted meds  Continue to work on therapy  Will need and I discussed with him that he will need to go to SNF for a short time to get some of his strength back before going home from safety standpoint.  Will shoot for discharge to SNF tomorrow

## 2013-06-09 NOTE — Progress Notes (Signed)
I have reviewed this note and agree with all findings. Kati Minette Manders, PT, DPT Pager: [phone]   

## 2013-06-09 NOTE — Progress Notes (Signed)
ANTICOAGULATION CONSULT NOTE - Follow Up Consult  Pharmacy Consult for Warfarin Indication: Hx of Afib, chronic Warfarin VTE prophylaxis s/p R TKR  Allergies  Allergen Reactions  . Hydrocodone Other (See Comments)    Extreme confusion  . Penicillins Hives   Patient Measurements: Height: 5' 8.11" (173 cm) Weight: 259 lb 7.7 oz (117.7 kg) IBW/kg (Calculated) : 68.65  Vital Signs: Temp: 98.7 F (37.1 C) (11/13 0400) Temp src: Oral (11/13 0400) BP: 125/50 mmHg (11/13 0600) Pulse Rate: 74 (11/13 0600)  Labs:  Recent Labs  06/07/13 0445 06/08/13 0455 06/09/13 0443  HGB 7.4* 9.8* 9.7*  HCT 22.7* 28.8* 28.5*  PLT 114* 114* 127*  LABPROT 17.7* 24.0* 24.9*  INR 1.50* 2.23* 2.34*  CREATININE 4.83* 4.94* 4.06*   Estimated Creatinine Clearance: 16.9 ml/min (by C-G formula based on Cr of 4.06).  Assessment: 79 yo M s/p R TKa on 06/06/13, with history of A.Fib on chronic anticoagulation with warfarin- home dose 5mg  on Sun,Wed; 2.5mg  other days with last dose 11/4. Resume warfarin post-operatively.   INR increased very quickly after only 2 doses of warfarin inpatient from 1.34-->2.23.  Dose held last night and Lovenox d/c'd.  INR 2.34 today.  Will resume warfarin today at low dose.    Patient on regular diet and PO intake documented.  CBC: Hgb improved and stable after 2 units PRBCs 11/11  AoCKD-IV (baseline appears 1.8-2.2). SCr improved today to 4.06. CrCl ~16  Yesterday evening, noted to be in afib with RVR.  Patient was on amiodarone 100 mg daily PTA and this was increased to 200 mg daily by cardiology yesterday.  Noted drug interaction with warfarin; however, may not see response in INR to increased dose for several days/weeks due to long half life.  If plan is to discharge patient on higher amiodarone dose, would reduce weekly warfarin dose with close INR follow up.  Would start at 2.5 mg daily without extra doses on Sundays/Wednesdays as PTA.  Goal of Therapy:  INR 2-3    Plan:  Warfarin 1.5 mg PO today.  Follow up INR in AM.   Clance Boll, PharmD, BCPS Pager: [phone] 06/09/2013 9:08 AM

## 2013-06-09 NOTE — Progress Notes (Signed)
Patient ID: Steven [name], male   DOB: [birth year], 79 y.o.   MRN: [id number]   [place], [place]  Date of visit:  05/06/2013 DOB:  1927/01/21    Age:  79 yrs. Medical record number:  [id number]     Account number:  [id number] Primary Care Provider: FRIED, ROBERT L ____________________________ CURRENT DIAGNOSES  1. Arrhythmia-Atrial Fibrillation  2. Pre-Op Cardiovascular Exam  3. Hypertensive Heart Disease-Benign without CHF  4. Long Term Use Anticoagulant  5. Chronic Kidney Disease (Stage 4)  6. Obesity(BMI30-40) ____________________________ ALLERGIES  Hydrocodone, Confusion  Penicillins, Intolerance-unknown ____________________________ MEDICATIONS  1. mirtazapine 15 mg tablet, QHS  2. amlodipine 5 mg tablet, 1 p.o. daily  3. warfarin 5 mg tablet, Take as directed  4. lisinopril 20 mg tablet, 1 p.o. daily  5. methocarbamol 500 mg tablet, PRN  6. atorvastatin 10 mg tablet, 1 p.o. daily  7. allopurinol 100 mg tablet, 1 p.o. daily  8. furosemide 40 mg tablet, 1 p.o. daily  9. Vitamin B-12 1,000 mcg/mL Solution, 1 month  10. Colace 100 mg capsule, 1 p.o. daily  11. medroxyprogesterone 5 mg tablet, BID  12. metoprolol succinate 100 mg tablet extended release 24 hr, 1 p.o. daily  13. Vitamin D3 1,000 unit tablet, 1 p.o. daily  14. amiodarone 200 mg tablet, 1/2 tablet po M W F  15. Sensipar 30 mg tablet, MWF ____________________________ HISTORY OF PRESENT ILLNESS Patient seen for preoperative evaluation prior to knee replacement surgery. The patient has a history of atrial fibrillation and hypertensive heart disease. He has stage IV chronic kidney disease and is in need of knee replacement surgery. He has no angina and denies PND, orthopnea, syncope, or claudication. A couple of years ago he tolerated a left knee replacement fine. We had done cardioversion on him preoperatively and he has remained in sinus rhythm since then. He denies other symptoms at this  time. ____________________________ PAST HISTORY  Past Medical Illnesses:  hypertension, gout, prostate cancer treated with Lupron, nephrolithiasis, hyperlipidemia, chronic kidney disease Stage 3;  Cardiovascular Illnesses:  atrial fibrillation;  Surgical Procedures:  removal kidney stone, knee replacement-left 2012;  Cardiology Procedures-Invasive:  cardioversion September 2012;  Cardiology Procedures-Noninvasive:  echocardiogram September 2012;  LVEF of 55% documented via echocardiogram on 04/01/2011,  CHADS Score:  2,  CHA2DS2-VASC Score:  3 ____________________________ CARDIO-PULMONARY TEST DATES EKG Date:  05/06/2013;  Echocardiography Date: 04/01/2011;  Chest Xray Date: 03/31/2011;   ____________________________ FAMILY HISTORY Brother -- Myocardial infarction, Brother dead Father -- Myocardial infarction, Father dead Mother -- Ovarian Carcinoma, Mother dead Son -- Carcinoma of the pancreas, Son dead ____________________________ SOCIAL HISTORY Alcohol Use:  no alcohol use;  Smoking:  used to smoke but quit;  Diet:  regular diet;  Lifestyle:  widower;  Exercise:  no regular exercise;  Occupation:  retired and Games developer;  Residence:  lives alone;   ____________________________ REVIEW OF SYSTEMS General:  malaise and fatigue Eyes: denies diplopia, history of glaucoma or visual problems. Respiratory: mild dyspnea with exertion Cardiovascular:  please review HPI Abdominal: denies dyspepsia, GI bleeding, constipation, or diarrhea Genitourinary-Male: hesitancy, nocturia  Musculoskeletal:  arthritis of the right knee Neurological:  denies headaches, stroke, or TIA,.  ____________________________ PHYSICAL EXAMINATION VITAL SIGNS  Blood Pressure:  127/70 Sitting, Right arm, regular cuff   Pulse:  90/min. Weight:  235.00 lbs. Height:  68"BMI: 35  Constitutional:  pleasant white male in no acute distress, mildly obese walks with cane Skin:  warm and dry to touch, no apparent skin lesions,  or  masses noted. Head:  normocephalic, normal hair pattern, no masses or tenderness ENT:  ears, nose and throat reveal no gross abnormalities.  Dentition good. Neck:  supple, without massess. No JVD, thyromegaly or carotid bruits. Carotid upstroke normal. Chest:  normal symmetry, clear to auscultation and percussion. Cardiac:  regular rhythm, normal S1 and S2, No S3 or S4, no murmurs, gallops or rubs detected. Peripheral Pulses:  the femoral,dorsalis pedis, and posterior tibial pulses are full and equal bilaterally with no bruits auscultated. Extremities & Back:  bowlegged, 2+ edema, mild bilateral venous insufficiency changes present, Healed scar from knee surgery Neurological:  unsteady gait, wide based gait ____________________________ MOST RECENT LIPID PANEL 08/13/11  CHOL TOTL 128 mg/dl, LDL 87 calc, HDL 31 mg/dl, TRIGLYCER 96 mg/dl and CHOL/HDL 4.1 (Calc) ____________________________ IMPRESSIONS/PLAN  1. From a cardiovascular viewpoint, may proceed with the planned knee replacement surgery. Surgery should be done in a hospitalized setting and he should be monitored on telemetry postoperatively as he does have some possibility of recurrence of atrial fibrillation then. He should continue his cardiac medications during the period operative period. 2. Hypertensive heart disease without heart failure 3. Long term use of anticoagulants  Recommendations:  May proceed with surgery from a cardiac viewpoint. Please let me know if any further questions or concerns regarding his treatment. I would be happy to see him with you during the perioperative period if needed. EKG shows normal sinus rhythm with one PVC and no acute ST changes. ____________________________ TODAYS ORDERS  1. Return Visit: keep appt  2. 12 Lead EKG: Today                       ____________________________ Cardiology Physician:  [name] MD [facility]

## 2013-06-09 NOTE — Progress Notes (Signed)
CARE MANAGEMENT NOTE 06/09/2013  Patient:  Steven [name], Steven [name]   Account Number:  [id number]  Date Initiated:  06/07/2013  Documentation initiated by:  Colleen Can  Subjective/Objective Assessment:   dx rt knee osteoarthritis; total knee replacemnt     Action/Plan:   HH vs SNF. CM will follow progress.Therapy is recommending SNF./24 hr supervision, 2+ assist   Anticipated DC Date:  06/12/2013   Anticipated DC Plan:  SKILLED NURSING FACILITY  In-house referral  Clinical Social Worker      DC Planning Services  CM consult      Choice offered to / List presented to:             Status of service:  In process, will continue to follow Medicare Important Message given?  NA - LOS <3 / Initial given by admissions (If response is "NO", the following Medicare IM given date fields will be blank) Date Medicare IM given:   Date Additional Medicare IM given:    Discharge Disposition:    Per UR Regulation:    If discussed at Long Length of Stay Meetings, dates discussed:    Comments:  [id number]/Rhonda Earlene Plater RN, BSN, [place] [phone] Chart Reviewed for discharge and hospital needs.  Patient transferred to icu s/p surg due to a.fib and iv cardizem drip. Discharge needs at time of review:  None present will follow for needs. Review of patient progress due on [id number].

## 2013-06-09 NOTE — Progress Notes (Addendum)
CSW met with pt and pt friend at bedside.   CSW discussed with pt about if pt was able to make decision re: SNF.  Pt stated that he had not yet made decision and asked CSW to contact pt HCPOA, Kathie Rhodes to discuss as pt stated that he would be agreeable to whatever facility that [facility] decided upon.  Pt friend assisted CSW in contacted [facility] via telephone.  CSW discussed with Kathie Rhodes the bed offers and per pt HCPOA, choice would be [facility] as this is the closest facility to pt home in [place] and pt wishes are to remain as close to home as possible. CSW discussed this with pt and pt was agreeable to [facility].  CSW contacted [facility] and left voice message to notify of pt acceptance of bed offer. CSW requested Joetta Manners to submit [facility] authorization.  CSW to continue to follow and facilitate pt discharge needs when pt medically stable for discharge and [facility] authorization received.   Addendum:  CSW received return phone call from [facility].  [facility] can accept pt when medically stable for discharge and inquired when pt may be medically ready.  CSW reviewed chart and notified Joetta Manners that per MD note, hopeful for discharge tomorrow.  [facility] requested PT/OT treatment notes in order to for [facility] to submit for [facility] authorization.  CSW sent PT/OT treatment notes via [facility] in order for [facility] to submit [facility] authorization.  CSW to continue to follow and facilitate pt discharge needs when pt medically ready for discharge and [facility] authorization received. Jacklynn Lewis, MSW, LCSWA  Clinical Social Work [phone]

## 2013-06-10 LAB — PROTIME-INR
INR: 2.14 — ABNORMAL HIGH (ref 0.00–1.49)
Prothrombin Time: 23.2 seconds — ABNORMAL HIGH (ref 11.6–15.2)

## 2013-06-10 MED ORDER — DILTIAZEM HCL 60 MG PO TABS
60.0000 mg | ORAL_TABLET | Freq: Three times a day (TID) | ORAL | Status: DC
  Administered 2013-06-10 – 2013-06-11 (×4): 60 mg via ORAL
  Filled 2013-06-10 (×7): qty 1

## 2013-06-10 MED ORDER — WARFARIN SODIUM 2.5 MG PO TABS
2.5000 mg | ORAL_TABLET | Freq: Once | ORAL | Status: AC
  Administered 2013-06-10: 18:00:00 2.5 mg via ORAL
  Filled 2013-06-10: qty 1

## 2013-06-10 NOTE — Progress Notes (Signed)
Patient's blood pressure noted to be 91/56 but patient is still in afib with a HR ranging from 126-128. Notified Dr. Donnie Aho  Of blood pressure and was advised to still give morning amniodarone, diltazem, and pacerone. Will continue to closely monitor blood pressure. Erskin Burnet  RN

## 2013-06-10 NOTE — Progress Notes (Signed)
ANTICOAGULATION CONSULT NOTE - Follow Up Consult  Pharmacy Consult for Warfarin Indication: Hx of Afib, chronic Warfarin VTE prophylaxis s/p R TKR  Allergies  Allergen Reactions  . Hydrocodone Other (See Comments)    Extreme confusion  . Penicillins Hives   Patient Measurements: Height: 5' 8.11" (173 cm) Weight: 259 lb 7.7 oz (117.7 kg) IBW/kg (Calculated) : 68.65  Vital Signs: Temp: 98.5 F (36.9 C) (11/14 0550) Temp src: Oral (11/14 0550) BP: 112/54 mmHg (11/14 0550) Pulse Rate: 117 (11/14 0550)  Labs:  Recent Labs  06/08/13 0455 06/09/13 0443 06/10/13 0519  HGB 9.8* 9.7*  --   HCT 28.8* 28.5*  --   PLT 114* 127*  --   LABPROT 24.0* 24.9* 23.2*  INR 2.23* 2.34* 2.14*  CREATININE 4.94* 4.06*  --    Estimated Creatinine Clearance: 16.9 ml/min (by C-G formula based on Cr of 4.06).  Assessment: 79 yo M s/p R TKa on 06/06/13, with history of A.Fib on chronic anticoagulation with warfarin- home dose 5mg  on Sun,Wed; 2.5mg  other days with last dose 11/4. Resume warfarin post-operatively.   INR increased very quickly after only 2 doses of warfarin inpatient from 1.34-->2.23.  Dose held on 11/12 and Lovenox d/c'd.  Low dose warfarin resumed 11/13 (1.5mg )    CBC: Hgb improved and stable after 2 units PRBCs 11/11  Noted drug interaction with warfarin; however, may not see response in INR to increased dose for several days/weeks due to long half life.  If plan is to discharge patient on higher amiodarone dose, would reduce weekly warfarin dose with close INR follow up.  Would start at 2.5 mg daily without extra doses on Sundays/Wednesdays as PTA.  Goal of Therapy:  INR 2-3   Plan:   Warfarin 2.5 mg PO today.   Daily PT/INR  Loralee Pacas, PharmD, BCPS Pager: [phone] 06/10/2013 8:51 AM

## 2013-06-10 NOTE — Progress Notes (Signed)
Physical Therapy Treatment Patient Details Name: Steven [name] MRN: [id number] DOB: 1927/02/14 Today's Date: 06/10/2013 Time: 1348-1400 PT Time Calculation (min): 12 min  PT Assessment / Plan / Recommendation  History of Present Illness Pt is an 79 yo male admitted for R TKA. Pt underwent L TKA two years ago. .   PT Comments   Pt is agreeable to ROM/strengthening exercises while seated in recliner but declined ambulation-"Dr Donnie Aho told me not to do any therapy today b/c my heart is out of rhythm". Will continue to follow and progress activity as able.   Follow Up Recommendations  SNF     Does the patient have the potential to tolerate intense rehabilitation     Barriers to Discharge        Equipment Recommendations  None recommended by PT    Recommendations for Other Services    Frequency 7X/week   Progress towards PT Goals Progress towards PT goals: Progressing toward goals (but did not ambulate)  Plan Current plan remains appropriate    Precautions / Restrictions Precautions Precautions: Fall;Knee Precaution Comments: monitor HR, BP Required Braces or Orthoses: Knee Immobilizer - Right Knee Immobilizer - Right: Other (comment) Restrictions Weight Bearing Restrictions: No   Pertinent Vitals/Pain Pt denied pain "it doesn't hurt much". Ice applied end of session HR, at rest, 101 bpm End of session (after exercises only) 110 bpm    Mobility  Bed Mobility Bed Mobility: Not assessed Details for Bed Mobility Assistance: pt sitting up in recliner Transfers Transfers: Not assessed Ambulation/Gait Ambulation/Gait Assistance: Not tested (comment) Ambulation/Gait Assistance Details: Pt stated cardiologist did not want him to have therapy today    Exercises Total Joint Exercises Ankle Circles/Pumps: AROM;Both;15 reps;Seated Quad Sets: AROM;Both;15 reps;Seated Hip ABduction/ADduction: AROM;Right;Seated;15 reps Straight Leg Raises: AAROM;Right;Seated;15 reps Long Arc  Quad: AROM;Right;10 reps;Seated Goniometric ROM: 5-95 degrees    PT Diagnosis:    PT Problem List:   PT Treatment Interventions:     PT Goals (current goals can now be found in the care plan section)    Visit Information  Last PT Received On: 06/10/13 History of Present Illness: Pt is an 79 yo male admitted for R TKA. Pt underwent L TKA two years ago. .    Subjective Data      Cognition  Cognition Arousal/Alertness: Awake/alert Behavior During Therapy: WFL for tasks assessed/performed Overall Cognitive Status: Within Functional Limits for tasks assessed    Balance     End of Session PT - End of Session Activity Tolerance: Patient tolerated treatment well Patient left: in chair;with call bell/phone within reach;with family/visitor present   GP     Rebeca Alert, MPT Pager: [phone]

## 2013-06-10 NOTE — Progress Notes (Signed)
Subjective:  Now on floor.  His a fib rate is faster today off of IV cardiazem.  Amiodarone was increased yesterday.  No c/o SOB or chest pain.  Objective:  Vital Signs in the last 24 hours: BP 112/54  Pulse 117  Temp(Src) 98.5 F (36.9 C) (Oral)  Resp 16  Ht 5' 8.11" (1.73 m)  Wt 117.7 kg (259 lb 7.7 oz)  BMI 39.33 kg/m2  SpO2 97%  Physical Exam: Obese pleasant white male in no acute distress Lungs:  Clear  Cardiac: Irregular rhythm, normal S1-S2 no S3  Abdomen:  Soft, nontender, no masses Extremities:  No edema present  Intake/Output from previous day: 11/13 0701 - 11/14 0700 In: 990 [P.O.:350; I.V.:640] Out: 1395 [Urine:1395] Weight Filed Weights   06/06/13 1950 06/09/13 0400  Weight: 106.6 kg (235 lb 0.2 oz) 117.7 kg (259 lb 7.7 oz)    Lab Results: Basic Metabolic Panel:  Recent Labs  06/08/13 0455 06/09/13 0443  NA 136 135  K 4.9 5.0  CL 102 103  CO2 21 23  GLUCOSE 134* 117*  BUN 80* 75*  CREATININE 4.94* 4.06*    CBC:  Recent Labs  06/08/13 0455 06/09/13 0443  WBC 15.1* 12.9*  HGB 9.8* 9.7*  HCT 28.8* 28.5*  MCV 91.1 92.5  PLT 114* 127*    PROTIME: Lab Results  Component Value Date   INR 2.14* 06/10/2013   INR 2.34* 06/09/2013   INR 2.23* 06/08/2013    Telemetry: Atrial fibrillation with rapid response  Assessment/Plan:  1. Recurrent atrial fibrillation in a patient with a prior history of paroxysmal atrial fibrillation.  Rate is currently faster but he is assymptomatic 2. Stage IV chronic kidney disease 3. Hypertensive heart disease 4. Postoperative hip replacement with postoperative anemia 5. Thrombocytopenia  Recommendations:  Watch over weekend.  Higher dose of amiodarone.  If he does not convert keep NPO past [place] and may consider cardioversion prior to transfer to SNF.  Add diltiazem to help with rate control.   W. Spencer Shant Hence, Jr.  MD FACC Cardiology  06/10/2013, 8:07 AM 

## 2013-06-10 NOTE — Progress Notes (Signed)
Clinical Social Work  Per progression meeting and chart review, patient is not medically stable to DC today. CSW spoke with [facility] who reports authorization was received from [facility] and that patient can be admitted over the weekend if someone can complete paperwork for patient today.   CSW met with patient and POA at bedside and explained information. CSW provided directions to SNF and POA will call and complete paperwork.  CSW will continue to follow and will leave handoff for weekend CSW incase weekend DC is needed.  Unk Lightning, LCSW (Coverage for [employer])

## 2013-06-10 NOTE — Progress Notes (Addendum)
   Subjective: 4 Days Post-Op Procedure(s) (LRB): RIGHT TOTAL KNEE ARTHROPLASTY (Right)   Patient reports pain as mild, pain well controlled. No events throughout the night. Heart rate is still running high.  Objective:   VITALS:   Filed Vitals:   06/10/13  BP: 112/54  Pulse: 117  Temp: 98.5 F (36.9 C)   Resp: 16    Neurovascular intact Dorsiflexion/Plantar flexion intact Incision: dressing C/D/I No cellulitis present Compartment soft  LABS  Recent Labs  06/08/13 0455 06/09/13 0443  HGB 9.8* 9.7*  HCT 28.8* 28.5*  WBC 15.1* 12.9*  PLT 114* 127*     Recent Labs  06/08/13 0455 06/09/13 0443  NA 136 135  K 4.9 5.0  BUN 80* 75*  CREATININE 4.94* 4.06*  GLUCOSE 134* 117*     Assessment/Plan: 4 Days Post-Op Procedure(s) (LRB): RIGHT TOTAL KNEE ARTHROPLASTY (Right) Up with therapy Discharge to SNF eventually when ready  Follow up in 2 weeks at [facility]. Follow up with OLIN,Ethel Veronica D in 2 weeks.  Contact information:  [facility] [address] [place] [address] [phone]    Recurrent atrial fibrillation in a patient with a prior history of paroxysmal atrial fibrillation His a fib rate is faster today off of IV cardiazem.  Amiodarone was increased yesterday. Warfarin to continue indefinitely Dr. Donnie Aho to follow would like to watch over weekend. Higher dose of amiodarone.  Add diltiazem to help with rate control. If he does not convert keep NPO past MN Monday and may consider cardioversion prior to transfer to SNF.   ABLA  Treated with 2 units of blood 3 days prior, will observe. Continues to feel better.  Continued treated with Iron for 2-3 weeks.  Obese (BMI 30-39.9)  Estimated body mass index is 35.62 kg/(m^2) as calculated from the following:      Height as of this encounter: 5' 8.11" (1.73 m).      Weight as of this encounter: 106.6 kg (235 lb 0.2 oz).  Patient also counseled  that weight may inhibit the healing process  Patient counseled that losing weight will help with future health issues      Anastasio Auerbach. Chandlar Guice   PAC  06/10/2013, 8:03 AM

## 2013-06-10 NOTE — Progress Notes (Deleted)
Subjective:  Now on floor.  His a fib rate is faster today off of IV cardiazem.  Amiodarone was increased yesterday.  No c/o SOB or chest pain.  Objective:  Vital Signs in the last 24 hours: BP 112/54  Pulse 117  Temp(Src) 98.5 F (36.9 C) (Oral)  Resp 16  Ht 5' 8.11" (1.73 m)  Wt 117.7 kg (259 lb 7.7 oz)  BMI 39.33 kg/m2  SpO2 97%  Physical Exam: Obese pleasant white male in no acute distress Lungs:  Clear  Cardiac: Irregular rhythm, normal S1-S2 no S3  Abdomen:  Soft, nontender, no masses Extremities:  No edema present  Intake/Output from previous day: 11/13 0701 - 11/14 0700 In: 990 [P.O.:350; I.V.:640] Out: 1395 [Urine:1395] Weight Filed Weights   06/06/13 1950 06/09/13 0400  Weight: 106.6 kg (235 lb 0.2 oz) 117.7 kg (259 lb 7.7 oz)    Lab Results: Basic Metabolic Panel:  Recent Labs  16/10/96 0455 06/09/13 0443  NA 136 135  K 4.9 5.0  CL 102 103  CO2 21 23  GLUCOSE 134* 117*  BUN 80* 75*  CREATININE 4.94* 4.06*    CBC:  Recent Labs  06/08/13 0455 06/09/13 0443  WBC 15.1* 12.9*  HGB 9.8* 9.7*  HCT 28.8* 28.5*  MCV 91.1 92.5  PLT 114* 127*    PROTIME: Lab Results  Component Value Date   INR 2.14* 06/10/2013   INR 2.34* 06/09/2013   INR 2.23* 06/08/2013    Telemetry: Atrial fibrillation with rapid response  Assessment/Plan:  1. Recurrent atrial fibrillation in a patient with a prior history of paroxysmal atrial fibrillation.  Rate is currently faster but he is assymptomatic 2. Stage IV chronic kidney disease 3. Hypertensive heart disease 4. Postoperative hip replacement with postoperative anemia 5. Thrombocytopenia  Recommendations:  Watch over weekend.  Higher dose of amiodarone.  If he does not convert keep NPO past MN [place] and may consider cardioversion prior to transfer to SNF.  Add diltiazem to help with rate control.   [name]  MD [facility]  06/10/2013, 8:07 AM

## 2013-06-10 NOTE — Progress Notes (Signed)
Clinical Social Work Department CLINICAL SOCIAL WORK PLACEMENT NOTE 06/10/2013  Patient:  Steven [name], Steven [name]  Account Number:  [id number] Admit date:  06/06/2013  Clinical Social Worker:  Cori Razor, LCSW  Date/time:  06/07/2013 02:58 PM  Clinical Social Work is seeking post-discharge placement for this patient at the following level of care:   SKILLED NURSING   (*CSW will update this form in Epic as items are completed)   06/07/2013  Patient/family provided with [facility] Department of Clinical Social Work's list of facilities offering this level of care within the geographic area requested by the patient (or if unable, by the patient's family).  06/07/2013  Patient/family informed of their freedom to choose among providers that offer the needed level of care, that participate in Medicare, Medicaid or managed care program needed by the patient, have an available bed and are willing to accept the patient.  06/07/2013  Patient/family informed of [facility]' ownership interest in [facility], as well as of the fact that they are under no obligation to receive care at this facility.  PASARR submitted to EDS on 06/07/2013 PASARR number received from EDS on 06/07/2013  FL2 transmitted to all facilities in geographic area requested by pt/family on  06/07/2013 FL2 transmitted to all facilities within larger geographic area on   Patient informed that his/her managed care company has contracts with or will negotiate with  certain facilities, including the following:     Patient/family informed of bed offers received:  06/08/2013 Patient chooses bed at [facility] Physician recommends and patient chooses bed at    Patient to be transferred to [facility] on   Patient to be transferred to facility by   The following physician request were entered in Epic:   Additional Comments:

## 2013-06-11 DIAGNOSIS — I119 Hypertensive heart disease without heart failure: Secondary | ICD-10-CM

## 2013-06-11 LAB — PROTIME-INR: INR: 2.17 — ABNORMAL HIGH (ref 0.00–1.49)

## 2013-06-11 LAB — CBC
Hemoglobin: 9.2 g/dL — ABNORMAL LOW (ref 13.0–17.0)
MCH: 31.2 pg (ref 26.0–34.0)
MCHC: 33.2 g/dL (ref 30.0–36.0)
MCV: 93.9 fL (ref 78.0–100.0)
Platelets: 148 10*3/uL — ABNORMAL LOW (ref 150–400)
RDW: 15.7 % — ABNORMAL HIGH (ref 11.5–15.5)
WBC: 8.8 10*3/uL (ref 4.0–10.5)

## 2013-06-11 MED ORDER — DILTIAZEM HCL ER COATED BEADS 180 MG PO CP24
180.0000 mg | ORAL_CAPSULE | Freq: Every day | ORAL | Status: DC
  Administered 2013-06-11 – 2013-06-13 (×3): 180 mg via ORAL
  Filled 2013-06-11 (×4): qty 1

## 2013-06-11 MED ORDER — WARFARIN SODIUM 2.5 MG PO TABS
2.5000 mg | ORAL_TABLET | Freq: Once | ORAL | Status: AC
  Administered 2013-06-11: 17:00:00 2.5 mg via ORAL
  Filled 2013-06-11: qty 1

## 2013-06-11 NOTE — Progress Notes (Signed)
ANTICOAGULATION CONSULT NOTE - Follow Up Consult  Pharmacy Consult for Warfarin Indication: Hx of Afib, chronic Warfarin VTE prophylaxis s/p R TKR  Allergies  Allergen Reactions  . Hydrocodone Other (See Comments)    Extreme confusion  . Penicillins Hives   Patient Measurements: Height: 5' 8.11" (173 cm) Weight: 259 lb 7.7 oz (117.7 kg) IBW/kg (Calculated) : 68.65  Vital Signs: Temp: 98.4 F (36.9 C) (11/15 0549) Temp src: Oral (11/15 0549) BP: 104/61 mmHg (11/15 0549) Pulse Rate: 120 (11/15 0549)  Labs:  Recent Labs  06/09/13 0443 06/10/13 0519 06/11/13 0447  HGB 9.7*  --  9.2*  HCT 28.5*  --  27.7*  PLT 127*  --  148*  LABPROT 24.9* 23.2* 23.5*  INR 2.34* 2.14* 2.17*  CREATININE 4.06*  --   --    Estimated Creatinine Clearance: 16.9 ml/min (by C-G formula based on Cr of 4.06).  Assessment: 79 yo M s/p R TKa on 06/06/13, with history of A.Fib on chronic anticoagulation with warfarin- home dose 5mg  on Sun,Wed; 2.5mg  other days with last dose 11/4. Resume warfarin post-operatively.   INR increased very quickly after only 2 doses of warfarin inpatient from 1.34-->2.23.  Dose held on 11/12 and Lovenox d/c'd.  Low dose warfarin resumed 11/13 (1.5mg )    INR therapeutic 11/15 - 2.17  CBC: Hgb improved and stable after 2 units PRBCs 11/11  Noted drug interaction with warfarin; however, may not see response in INR to increased dose for several days/weeks due to long half life.  If plan is to discharge patient on higher amiodarone dose, would reduce weekly warfarin dose with close INR follow up.  Would start at 2.5 mg daily without extra doses on Sundays/Wednesdays as PTA.  Goal of Therapy:  INR 2-3   Plan:   Repeat warfarin 2.5 mg PO today  Daily PT/INR   Hessie Knows, PharmD, BCPS Pager [phone] 06/11/2013 10:26 AM

## 2013-06-11 NOTE — Progress Notes (Signed)
SUBJECTIVE:  No compliants  OBJECTIVE:   Vitals:   Filed Vitals:   06/10/13 2019 06/11/13 0000 06/11/13 0400 06/11/13 0549  BP: 117/70   104/61  Pulse: 106   120  Temp: 98.5 F (36.9 C)   98.4 F (36.9 C)  TempSrc: Oral   Oral  Resp: 16 18 18 18   Height:      Weight:      SpO2: 97% 97% 99% 97%   I&O's:   Intake/Output Summary (Last 24 hours) at 06/11/13 0905 Last data filed at 06/11/13 0856  Gross per 24 hour  Intake    720 ml  Output   1500 ml  Net   -780 ml   TELEMETRY: Reviewed telemetry pt in NSR:     PHYSICAL EXAM General: Well developed, well nourished, in no acute distress Head: Eyes PERRLA, No xanthomas.   Normal cephalic and atramatic  Lungs:   Clear bilaterally to auscultation and percussion. Heart:   HRRR S1 S2 Pulses are 2+ & equal. Abdomen: Bowel sounds are positive, abdomen soft and non-tender without masses Extremities:   No clubbing, cyanosis or edema.  DP +1 Neuro: Alert and oriented X 3. Psych:  Good affect, responds appropriately   LABS: Basic Metabolic Panel:  Recent Labs  16/10/96 0443  NA 135  K 5.0  CL 103  CO2 23  GLUCOSE 117*  BUN 75*  CREATININE 4.06*  CALCIUM 9.7   Liver Function Tests: No results found for this basename: AST, ALT, ALKPHOS, BILITOT, PROT, ALBUMIN,  in the last 72 hours No results found for this basename: LIPASE, AMYLASE,  in the last 72 hours CBC:  Recent Labs  06/09/13 0443 06/11/13 0447  WBC 12.9* 8.8  HGB 9.7* 9.2*  HCT 28.5* 27.7*  MCV 92.5 93.9  PLT 127* 148*   Cardiac Enzymes: No results found for this basename: CKTOTAL, CKMB, CKMBINDEX, TROPONINI,  in the last 72 hours BNP: No components found with this basename: POCBNP,  D-Dimer: No results found for this basename: DDIMER,  in the last 72 hours Hemoglobin A1C: No results found for this basename: HGBA1C,  in the last 72 hours Fasting Lipid Panel: No results found for this basename: CHOL, HDL, LDLCALC, TRIG, CHOLHDL, LDLDIRECT,  in the  last 72 hours Thyroid Function Tests: No results found for this basename: TSH, T4TOTAL, FREET3, T3FREE, THYROIDAB,  in the last 72 hours Anemia Panel: No results found for this basename: VITAMINB12, FOLATE, FERRITIN, TIBC, IRON, RETICCTPCT,  in the last 72 hours Coag Panel:   Lab Results  Component Value Date   INR 2.17* 06/11/2013   INR 2.14* 06/10/2013   INR 2.34* 06/09/2013    RADIOLOGY: Dg Chest 2 View  06/01/2013   CLINICAL DATA:  pre op with total knee replacement right 06/06/13  EXAM: CHEST  2 VIEW  COMPARISON:  None  FINDINGS: Stable cardiac silhouette with ectatic aorta. Linear opacity right lung base which is increased from prior. Mild atelectasis left lung base. Upper lungs are clear. Degenerative osteophytosis of the thoracic spine.  IMPRESSION: Linear opacity at the right lung base is increased. Differential includes bronchitis, bronchiectasis, atelectasis, or infiltrate.  Mild atelectasis at the left lung base.   Electronically Signed   By: Genevive Bi M.D.   On: 06/01/2013 14:42   Assessment/Plan:  1. Recurrent atrial fibrillation in a patient with a prior history of paroxysmal atrial fibrillation. Now converted to NSR on Amio. 2. Stage IV chronic kidney disease  3. Hypertensive heart disease -  BP controlled 4. Postoperative hip replacement with postoperative anemia  5. Thrombocytopenia   Recommendations:  Continue Amio load over weekend. Change Diltiazem to CD 180mg  daily Continue Metoprolol Loading Warfarin     Quintella Reichert, MD  06/11/2013  9:05 AM

## 2013-06-11 NOTE — Progress Notes (Signed)
Physical Therapy Treatment Patient Details Name: Steven [name] MRN: [id number] DOB: 18-Apr-1927 Today's Date: 06/11/2013 Time: 7829-5621 PT Time Calculation (min): 23 min  PT Assessment / Plan / Recommendation  History of Present Illness Pt is an 79 yo male admitted for R TKA. Pt underwent L TKA two years ago. Marland KitchenPost op afib.   PT Comments   Pt tolerated ambulation well, noted dyspnea. HR 88 after ambulation.plans for SNF rehab.  Follow Up Recommendations  SNF     Does the patient have the potential to tolerate intense rehabilitation     Barriers to Discharge        Equipment Recommendations  None recommended by PT    Recommendations for Other Services    Frequency 7X/week   Progress towards PT Goals Progress towards PT goals: Progressing toward goals  Plan Current plan remains appropriate    Precautions / Restrictions Precautions Precautions: Fall;Knee Required Braces or Orthoses: Knee Immobilizer - Right   Pertinent Vitals/Pain HR 88, sats 96 RA, no pain    Mobility  Bed Mobility Bed Mobility: Not assessed Transfers Sit to Stand: 4: Min assist;With armrests;From chair/3-in-1;With upper extremity assist Stand to Sit: 4: Min assist;With upper extremity assist;With armrests;To chair/3-in-1 Details for Transfer Assistance: cues for safety, slow down,  Ambulation/Gait Ambulation/Gait Assistance: 1: +2 Total assist (for safety) Ambulation/Gait: Patient Percentage: 70% Ambulation Distance (Feet): 100 Feet Assistive device: Rolling walker Ambulation/Gait Assistance Details: cues for body position inside of RW Gait Pattern: Step-through pattern;Decreased stride length Gait velocity: decreased    Exercises Total Joint Exercises Quad Sets: AROM;Both;15 reps;Seated Heel Slides: AAROM;Right;15 reps;Seated Hip ABduction/ADduction: AROM;Right;Seated;15 reps Straight Leg Raises: Right;Seated;15 reps;AROM Long Arc Quad: AROM;Right;10 reps;Seated Knee Flexion: AROM;Right;10  reps;Seated Goniometric ROM: 5-95   PT Diagnosis:    PT Problem List:   PT Treatment Interventions:     PT Goals (current goals can now be found in the care plan section)    Visit Information  Last PT Received On: 06/11/13 Assistance Needed: +2 History of Present Illness: Pt is an 79 yo male admitted for R TKA. Pt underwent L TKA two years ago. Marland KitchenPost op afib.    Subjective Data      Cognition  Cognition Arousal/Alertness: Awake/alert Behavior During Therapy: Impulsive Overall Cognitive Status: Within Functional Limits for tasks assessed Memory: Decreased recall of precautions;Decreased short-term memory    Balance     End of Session PT - End of Session Equipment Utilized During Treatment: Right knee immobilizer Activity Tolerance: Patient tolerated treatment well Patient left: in chair;with call bell/phone within reach;with family/visitor present Nurse Communication: Mobility status;Other (comment)   GP     [name] 06/11/2013, 12:34 PM [name] PT [phone]

## 2013-06-11 NOTE — Progress Notes (Signed)
No change from am assessment, pt up in chair, no c/o

## 2013-06-11 NOTE — Progress Notes (Signed)
Subjective: 5 Days Post-Op Procedure(s) (LRB): RIGHT TOTAL KNEE ARTHROPLASTY (Right) Patient reports pain as 2 on 0-10 scale.   Comfortable. Very pleased with knee. Objective: Vital signs in last 24 hours: Temp:  [97.7 F (36.5 C)-98.5 F (36.9 C)] 98.4 F (36.9 C) (11/15 0549) Pulse Rate:  [78-126] 120 (11/15 0549) Resp:  [16-18] 18 (11/15 0549) BP: (100-117)/(58-70) 104/61 mmHg (11/15 0549) SpO2:  [97 %-99 %] 97 % (11/15 0549)  Intake/Output from previous day: 11/14 0701 - 11/15 0700 In: 1080 [P.O.:840; I.V.:240] Out: 1550 [Urine:1550] Intake/Output this shift: Total I/O In: -  Out: 150 [Urine:150]   Recent Labs  06/09/13 0443 06/11/13 0447  HGB 9.7* 9.2*    Recent Labs  06/09/13 0443 06/11/13 0447  WBC 12.9* 8.8  RBC 3.08* 2.95*  HCT 28.5* 27.7*  PLT 127* 148*    Recent Labs  06/09/13 0443  NA 135  K 5.0  CL 103  CO2 23  BUN 75*  CREATININE 4.06*  GLUCOSE 117*  CALCIUM 9.7    Recent Labs  06/10/13 0519 06/11/13 0447  INR 2.14* 2.17*    Incision: no drainage  Assessment/Plan: 5 Days Post-Op Procedure(s) (LRB): RIGHT TOTAL KNEE ARTHROPLASTY (Right) Discharge to SNF when stable. Appreciate Dr Malachy Mood help. Continue knee therapies.  Hershey Knauer ANDREW 06/11/2013, 10:17 AM

## 2013-06-12 MED ORDER — AMIODARONE IV BOLUS ONLY 150 MG/100ML
150.0000 mg | Freq: Once | INTRAVENOUS | Status: AC
  Administered 2013-06-12: 09:00:00 150 mg via INTRAVENOUS
  Filled 2013-06-12: qty 100

## 2013-06-12 MED ORDER — WARFARIN SODIUM 2.5 MG PO TABS
2.5000 mg | ORAL_TABLET | Freq: Once | ORAL | Status: AC
  Administered 2013-06-12: 17:00:00 2.5 mg via ORAL
  Filled 2013-06-12: qty 1

## 2013-06-12 NOTE — Progress Notes (Signed)
ANTICOAGULATION CONSULT NOTE - Follow Up Consult  Pharmacy Consult for Warfarin Indication: Hx of Afib, chronic Warfarin VTE prophylaxis s/p R TKR  Allergies  Allergen Reactions  . Hydrocodone Other (See Comments)    Extreme confusion  . Penicillins Hives   Patient Measurements: Height: 5' 8.11" (173 cm) Weight: 259 lb 7.7 oz (117.7 kg) IBW/kg (Calculated) : 68.65  Vital Signs: Temp: 98.7 F (37.1 C) (11/16 0557) Temp src: Oral (11/16 0557) BP: 107/59 mmHg (11/16 1015) Pulse Rate: 110 (11/16 1015)  Labs:  Recent Labs  06/10/13 0519 06/11/13 0447 06/12/13 0445  HGB  --  9.2*  --   HCT  --  27.7*  --   PLT  --  148*  --   LABPROT 23.2* 23.5* 24.9*  INR 2.14* 2.17* 2.34*   Estimated Creatinine Clearance: 16.9 ml/min (by C-G formula based on Cr of 4.06).  Assessment: 79 yo M s/p R TKa on 06/06/13, with history of A.Fib on chronic anticoagulation with warfarin- home dose 5mg  on Sun,Wed; 2.5mg  other days with last dose 11/4. Resume warfarin post-operatively.   INR increased very quickly after only 2 doses of warfarin inpatient from 1.34-->2.23.  Dose held on 11/12 and Lovenox d/c'd.  Low dose warfarin resumed 11/13 (1.5mg )    INR therapeutic today 11/16  CBC: Hgb improved and stable after 2 units PRBCs 11/11  Noted drug interaction with warfarin; however, may not see response in INR to increased dose for several days/weeks due to long half life.  If plan is to discharge patient on higher amiodarone dose, would reduce weekly warfarin dose with close INR follow up.  Would start at 2.5 mg daily without extra doses on Sundays/Wednesdays as PTA.  Goal of Therapy:  INR 2-3   Plan:   Repeat warfarin 2.5 mg PO today  Daily PT/INR   Hessie Knows, PharmD, BCPS Pager [phone] 06/12/2013 1:00 PM

## 2013-06-12 NOTE — Progress Notes (Signed)
  SUBJECTIVE:  No complaints of CP or SOB  OBJECTIVE:   Vitals:   Filed Vitals:   06/12/13 0056 06/12/13 0400 06/12/13 0557 06/12/13 0750  BP: 111/51  107/57   Pulse: 99  97   Temp: 98.6 F (37 C)  98.7 F (37.1 C)   TempSrc: Oral  Oral   Resp: 20 20 20 20   Height:      Weight:      SpO2: 98% 98% 97% 97%   I&O's:   Intake/Output Summary (Last 24 hours) at 06/12/13 0845 Last data filed at 06/12/13 0841  Gross per 24 hour  Intake   2729 ml  Output   1850 ml  Net    879 ml   TELEMETRY: Reviewed telemetry pt in atrial fibrillation with RVR at 117bpm     PHYSICAL EXAM General: Well developed, well nourished, in no acute distress Head: Eyes PERRLA, No xanthomas.   Normal cephalic and atramatic  Lungs:   Clear bilaterally to auscultation and percussion. Heart:   Irregularly irregular S1 S2 Pulses are 2+ & equal. Abdomen: Bowel sounds are positive, abdomen soft and non-tender without masses  Extremities:   No clubbing, cyanosis or edema.  DP +1 Neuro: Alert and oriented X 3. Psych:  Good affect, responds appropriately   LABS: Basic Metabolic Panel: No results found for this basename: NA, K, CL, CO2, GLUCOSE, BUN, CREATININE, CALCIUM, MG, PHOS,  in the last 72 hours Liver Function Tests: No results found for this basename: AST, ALT, ALKPHOS, BILITOT, PROT, ALBUMIN,  in the last 72 hours No results found for this basename: LIPASE, AMYLASE,  in the last 72 hours CBC:  Recent Labs  06/11/13 0447  WBC 8.8  HGB 9.2*  HCT 27.7*  MCV 93.9  PLT 148*   Coag Panel:   Lab Results  Component Value Date   INR 2.34* 06/12/2013   INR 2.17* 06/11/2013   INR 2.14* 06/10/2013    RADIOLOGY: Dg Chest 2 View  06/01/2013   CLINICAL DATA:  pre op with total knee replacement right 06/06/13  EXAM: CHEST  2 VIEW  COMPARISON:  None  FINDINGS: Stable cardiac silhouette with ectatic aorta. Linear opacity right lung base which is increased from prior. Mild atelectasis left lung base.  Upper lungs are clear. Degenerative osteophytosis of the thoracic spine.  IMPRESSION: Linear opacity at the right lung base is increased. Differential includes bronchitis, bronchiectasis, atelectasis, or infiltrate.  Mild atelectasis at the left lung base.   Electronically Signed   By: Genevive Bi M.D.   On: 06/01/2013 14:42   Assessment/Plan:  1. Recurrent atrial fibrillation in a patient with a prior history of paroxysmal atrial fibrillation. Was in  NSR yesterday but now back in afib at 90-117bpm on Amio.  2. Stage IV chronic kidney disease  3. Hypertensive heart disease - BP controlled  4. Postoperative hip replacement with postoperative anemia  5. Thrombocytopenia  Recommendations:  Continue Amio load over weekend.  Continue Diltiazem CD/Metoprolol.  Soft BP precludes increasing beta blocker or CCB at this time.  Will continue to monitor.  Will give a dose of Amio 150mg  IV now to try to slow rate down and possibly convert back to NSR.  Will make NPO for possible DCCV in am.   Continue  Warfarin - therapuetic INR     Quintella Reichert, MD  06/12/2013  8:45 AM

## 2013-06-12 NOTE — Progress Notes (Signed)
Per RN, Pt not ready for d/c today.  Notified facility.  Weekday CSW to follow.  Providence Crosby, LCSWA Clinical Social Work [phone]

## 2013-06-12 NOTE — Progress Notes (Signed)
   Subjective: 6 Days Post-Op Procedure(s) (LRB): RIGHT TOTAL KNEE ARTHROPLASTY (Right) Patient reports pain as mild.  . Patient is well, but has had some minor complaints of palpitations. he reports that he was feeling better yesterday but has had some issues with palpitations today. Denies SOB. He reports that he is anxious to get to rehab and do therapy.    Objective: Vital signs in last 24 hours: Temp:  [97.5 F (36.4 C)-98.7 F (37.1 C)] 98.7 F (37.1 C) (11/16 0557) Pulse Rate:  [69-99] 97 (11/16 0557) Resp:  [16-20] 20 (11/16 0750) BP: (107-128)/(51-85) 107/57 mmHg (11/16 0557) SpO2:  [97 %-98 %] 97 % (11/16 0750)  Intake/Output from previous day:  Intake/Output Summary (Last 24 hours) at 06/12/13 0814 Last data filed at 06/12/13 0650  Gross per 24 hour  Intake   2409 ml  Output   1650 ml  Net    759 ml     Labs:  Recent Labs  06/11/13 0447  HGB 9.2*    Recent Labs  06/11/13 0447  WBC 8.8  RBC 2.95*  HCT 27.7*  PLT 148*    Recent Labs  06/11/13 0447 06/12/13 0445  INR 2.17* 2.34*    EXAM General - Patient is Alert and Oriented Extremity - Intact pulses distally Dorsiflexion/Plantar flexion intact No cellulitis present Compartment soft Dressing/Incision - clean, dry, no drainage Motor Function - intact, moving foot and toes well on exam.   Past Medical History  Diagnosis Date  . Gout   . Hypertension   . Prostate ca   . Osteoarthritis   . PVC (premature ventricular contraction)   . HX: long term anticoagulant use   . Kidney stone   . CKD (chronic kidney disease), stage IV   . Anemia   . Hypercalcemia   . Vitamin D deficiency   . Atrial fibrillation   . Mitral valve disorders     Assessment/Plan: 6 Days Post-Op Procedure(s) (LRB): RIGHT TOTAL KNEE ARTHROPLASTY (Right) Principal Problem:   S/P right TKA Active Problems:   Acute blood loss anemia   Atrial fibrillation   Chronic kidney disease, stage IV (severe)   Long-term  (current) use of anticoagulants  Estimated body mass index is 39.33 kg/(m^2) as calculated from the following:   Height as of this encounter: 5' 8.11" (1.73 m).   Weight as of this encounter: 117.7 kg (259 lb 7.7 oz). Advance diet Up with therapy Discharge to SNF when stable  DVT Prophylaxis - Coumadin Weight-Bearing as tolerated   Will continue PT today. Cardiology to continue to monitor Afib. Plan for discharge to [facility] when stable.   Jennier Schissler LAUREN 06/12/2013, 8:14 AM

## 2013-06-12 NOTE — Progress Notes (Signed)
Physical Therapy Treatment Patient Details Name: Steven [name] MRN: [id number] DOB: [birth year] Today's Date: 06/12/2013 Time: 4098-1191 PT Time Calculation (min): 34 min  PT Assessment / Plan / Recommendation  History of Present Illness Pt is an 79 yo male admitted for R TKA. Pt underwent L TKA two years ago. Marland KitchenPost op afib.   PT Comments   Patient continues to be limited due to cardiac issues.  HR up to 130's with ambulation with seated rest required and down to 120's after 3 minutes seated rest so pt wheeled back to room in chair.  Very motivated and compliant with PT for knee rehab.  Feel will benefit from SNF level rehab prior to d/c home.  Follow Up Recommendations  SNF     Does the patient have the potential to tolerate intense rehabilitation   N/A  Barriers to Discharge  None      Equipment Recommendations  None recommended by PT    Recommendations for Other Services  None  Frequency 7X/week   Progress towards PT Goals Progress towards PT goals: Progressing toward goals  Plan Current plan remains appropriate    Precautions / Restrictions Precautions Precautions: Knee;Fall Required Braces or Orthoses: Knee Immobilizer - Right Restrictions Weight Bearing Restrictions: No   Pertinent Vitals/Pain Denies pain initially, increased with exercise, RN delivered medication    Mobility  Bed Mobility Bed Mobility: Not assessed Transfers Sit to Stand: 4: Min assist;From chair/3-in-1;With upper extremity assist;With armrests;3: Mod assist Stand to Sit: 4: Min assist;With upper extremity assist;With armrests;To chair/3-in-1 Details for Transfer Assistance: increased assist from desk chair without armrests (where stopped to rest in hallway) Ambulation/Gait Ambulation/Gait Assistance: 4: Min assist Ambulation Distance (Feet): 100 Feet Assistive device: Rolling walker Ambulation/Gait Assistance Details: cues for head up, increased flexed posture with fatigue Gait Pattern:  Step-through pattern;Trunk flexed;Decreased stride length;Shuffle    Exercises Total Joint Exercises Ankle Circles/Pumps: AROM;Both;Seated;20 reps Quad Sets: AROM;Both;Seated;10 reps Heel Slides: AAROM;Right;Seated;10 reps Hip ABduction/ADduction: AROM;Right;Seated;10 reps Straight Leg Raises: Right;Seated;AROM;10 reps Long Arc Quad: AROM;Right;10 reps;Seated     PT Goals (current goals can now be found in the care plan section)    Visit Information  Last PT Received On: 06/12/13 Assistance Needed: +1 History of Present Illness: Pt is an 79 yo male admitted for R TKA. Pt underwent L TKA two years ago. Marland KitchenPost op afib.    Subjective Data   Let's go ahead and get it done   Cognition  Cognition Arousal/Alertness: Awake/alert Behavior During Therapy: [facility] for tasks assessed/performed Overall Cognitive Status: Within Functional Limits for tasks assessed    Balance     End of Session PT - End of Session Equipment Utilized During Treatment: Gait belt;Right knee immobilizer Activity Tolerance: Treatment limited secondary to medical complications (Comment) Patient left: in chair;with call bell/phone within reach   GP     [facility] 06/12/2013, 10:55 AM Sheran Lawless, PT [phone] 06/12/2013

## 2013-06-12 NOTE — Progress Notes (Signed)
Patient converted back to A. Fib, rate 68. Patient resting, asymptomatic. VS 111/51, 98.6, 20, 98% RA. Telemetry strip placed in chart. Will continue to monitor. Newman Nip Halley

## 2013-06-13 ENCOUNTER — Inpatient Hospital Stay (HOSPITAL_COMMUNITY): Payer: Medicare PPO | Admitting: Anesthesiology

## 2013-06-13 ENCOUNTER — Encounter (HOSPITAL_COMMUNITY)
Admission: RE | Disposition: A | Discharge status: Skilled Nursing Facility | Payer: Self-pay | Source: Ambulatory Visit | Attending: Orthopedic Surgery

## 2013-06-13 ENCOUNTER — Encounter (HOSPITAL_COMMUNITY): Payer: Medicare PPO | Admitting: Anesthesiology

## 2013-06-13 LAB — CREATININE, SERUM
Creatinine, Ser: 3.63 mg/dL — ABNORMAL HIGH (ref 0.50–1.35)
GFR calc Af Amer: 16 mL/min — ABNORMAL LOW (ref >90)
GFR calc non Af Amer: 14 mL/min — ABNORMAL LOW (ref >90)

## 2013-06-13 LAB — PROTIME-INR
INR: 2.55 — ABNORMAL HIGH (ref 0.00–1.49)
Prothrombin Time: 26.6 seconds — ABNORMAL HIGH (ref 11.6–15.2)

## 2013-06-13 SURGERY — CARDIOVERSION
Anesthesia: Monitor Anesthesia Care | Wound class: Clean

## 2013-06-13 MED ORDER — FENTANYL CITRATE 0.05 MG/ML IJ SOLN: 25.0000 ug | INTRAMUSCULAR | Status: DC | PRN

## 2013-06-13 MED ORDER — SODIUM CHLORIDE 0.45 % IV SOLN
INTRAVENOUS | Status: DC
  Administered 2013-06-13: 1000 mL via INTRAVENOUS

## 2013-06-13 MED ORDER — LACTATED RINGERS IV SOLN: INTRAVENOUS | Status: DC

## 2013-06-13 MED ORDER — PROPOFOL 10 MG/ML IV BOLUS
INTRAVENOUS | Status: DC | PRN
  Administered 2013-06-13: 100 mg via INTRAVENOUS

## 2013-06-13 MED ORDER — HYDROCORTISONE 1 % EX CREA
1.0000 "application " | TOPICAL_CREAM | Freq: Three times a day (TID) | CUTANEOUS | Status: DC | PRN
  Filled 2013-06-13: qty 28

## 2013-06-13 MED ORDER — WARFARIN SODIUM 1 MG PO TABS
1.5000 mg | ORAL_TABLET | Freq: Once | ORAL | Status: AC
  Administered 2013-06-13: 18:00:00 1.5 mg via ORAL
  Filled 2013-06-13: qty 1

## 2013-06-13 MED ORDER — PROMETHAZINE HCL 25 MG/ML IJ SOLN: 6.2500 mg | INTRAMUSCULAR | Status: DC | PRN

## 2013-06-13 NOTE — Interval H&P Note (Signed)
History and Physical Interval Note:  06/13/2013 4:18 PM  Steven [name]  has presented today for surgery, with the diagnosis of atrial fib  The various methods of treatment have been discussed with the patient and family. After consideration of risks, benefits and other options for treatment, the patient has consented to  Procedure(s): CARDIOVERSION (N/A) as a surgical intervention .  The patient's history has been reviewed, patient examined, no change in status, stable for surgery.  I have reviewed the patient's chart and labs.  Questions were answered to the patient's satisfaction.     [name],W SPENCER

## 2013-06-13 NOTE — H&P (View-Only) (Signed)
Subjective:  Now on floor.  His a fib rate is faster today off of IV cardiazem.  Amiodarone was increased yesterday.  No c/o SOB or chest pain.  Objective:  Vital Signs in the last 24 hours: BP 112/54  Pulse 117  Temp(Src) 98.5 F (36.9 C) (Oral)  Resp 16  Ht 5' 8.11" (1.73 m)  Wt 117.7 kg (259 lb 7.7 oz)  BMI 39.33 kg/m2  SpO2 97%  Physical Exam: Obese pleasant white male in no acute distress Lungs:  Clear  Cardiac: Irregular rhythm, normal S1-S2 no S3  Abdomen:  Soft, nontender, no masses Extremities:  No edema present  Intake/Output from previous day: 11/13 0701 - 11/14 0700 In: 990 [P.O.:350; I.V.:640] Out: 1395 [Urine:1395] Weight Filed Weights   06/06/13 1950 06/09/13 0400  Weight: 106.6 kg (235 lb 0.2 oz) 117.7 kg (259 lb 7.7 oz)    Lab Results: Basic Metabolic Panel:  Recent Labs  06/08/13 0455 06/09/13 0443  NA 136 135  K 4.9 5.0  CL 102 103  CO2 21 23  GLUCOSE 134* 117*  BUN 80* 75*  CREATININE 4.94* 4.06*    CBC:  Recent Labs  06/08/13 0455 06/09/13 0443  WBC 15.1* 12.9*  HGB 9.8* 9.7*  HCT 28.8* 28.5*  MCV 91.1 92.5  PLT 114* 127*    PROTIME: Lab Results  Component Value Date   INR 2.14* 06/10/2013   INR 2.34* 06/09/2013   INR 2.23* 06/08/2013    Telemetry: Atrial fibrillation with rapid response  Assessment/Plan:  1. Recurrent atrial fibrillation in a patient with a prior history of paroxysmal atrial fibrillation.  Rate is currently faster but he is assymptomatic 2. Stage IV chronic kidney disease 3. Hypertensive heart disease 4. Postoperative hip replacement with postoperative anemia 5. Thrombocytopenia  Recommendations:  Watch over weekend.  Higher dose of amiodarone.  If he does not convert keep NPO past [place] and may consider cardioversion prior to transfer to SNF.  Add diltiazem to help with rate control.   W. Spencer Sholonda Jobst, Jr.  MD F[facility] Cardiology  06/10/2013, 8:07 AM 

## 2013-06-13 NOTE — Transfer of Care (Signed)
Immediate Anesthesia Transfer of Care Note  Patient: Steven [name]  Procedure(s) Performed: Procedure(s): CARDIOVERSION (N/A)  Patient Location: PACU  Anesthesia Type:MAC  Level of Consciousness: awake, alert , oriented and patient cooperative  Airway & Oxygen Therapy: Patient Spontanous Breathing and Patient connected to face mask oxygen  Post-op Assessment: Report given to PACU RN and Post -op Vital signs reviewed and stable  Post vital signs: Reviewed and stable  Complications: No apparent anesthesia complications

## 2013-06-13 NOTE — Progress Notes (Signed)
Patient was still in atrial fibrillation this am.  Underwent cardioversion this afternoon but unable to maintain NSR.   I would d/c on amiodarone 400 BID for one week and then reduce to 200 mg BID. I will see in the am.  W. Ashley Royalty MD [facility]

## 2013-06-13 NOTE — Progress Notes (Signed)
Patient ID: Steven [name], male   DOB: [birth year], 79 y.o.   MRN: [id number] Subjective: 7 Days Post-Op Procedure(s) (LRB): RIGHT TOTAL KNEE ARTHROPLASTY (Right)    Patient reports pain as mild to moderate. No events, however remains in a-fib.  Feels that this makes therapy more difficult.  Objective:   VITALS:   Filed Vitals:   06/13/13 0457  BP: 117/70  Pulse: 111  Temp: 98.3 F (36.8 C)  Resp: 20    Neurovascular intact Incision: dressing C/D/I Remains in a-fib with heart rate in the 110s  LABS  Recent Labs  06/11/13 0447  HGB 9.2*  HCT 27.7*  WBC 8.8  PLT 148*     Recent Labs  06/13/13 0420  CREATININE 3.63*     Recent Labs  06/12/13 0445 06/13/13 0420  INR 2.34* 2.55*     Assessment/Plan: 7 Days Post-Op Procedure(s) (LRB): RIGHT TOTAL KNEE ARTHROPLASTY (Right)   Up with therapy  As per discussion with Dr. Donnie Aho last week I will defer decision to cardiovert today versus as outpatient Discharge pending cardiology decisions  O/w continue activity as toerable

## 2013-06-13 NOTE — Anesthesia Preprocedure Evaluation (Addendum)
Anesthesia Evaluation  Patient identified by MRN, date of birth, ID band Patient awake    Reviewed: Allergy & Precautions, H&P , NPO status , Patient's Chart, lab work & pertinent test results, reviewed documented beta blocker date and time   Airway Mallampati: II TM Distance: >3 FB Neck ROM: full    Dental no notable dental hx. (+) Edentulous Upper and Edentulous Lower   Pulmonary neg pulmonary ROS,  breath sounds clear to auscultation  Pulmonary exam normal       Cardiovascular hypertension, Pt. on home beta blockers and Pt. on medications + dysrhythmias Atrial Fibrillation + Valvular Problems/Murmurs MVP Rhythm:regular Rate:Normal  PVCs   Neuro/Psych negative neurological ROS  negative psych ROS   GI/Hepatic negative GI ROS, Neg liver ROS,   Endo/Other  negative endocrine ROSMorbid obesity  Renal/GU CRFRenal diseaseStage 4 CRF  negative genitourinary   Musculoskeletal   Abdominal (+) + obese,   Peds  Hematology negative hematology ROS (+) anemia , hgb 9.2   Anesthesia Other Findings   Reproductive/Obstetrics negative OB ROS                        Anesthesia Physical Anesthesia Plan  ASA: III  Anesthesia Plan: MAC   Post-op Pain Management:    Induction:   Airway Management Planned: Simple Face Mask  Additional Equipment:   Intra-op Plan:   Post-operative Plan:   Informed Consent: I have reviewed the patients History and Physical, chart, labs and discussed the procedure including the risks, benefits and alternatives for the proposed anesthesia with the patient or authorized representative who has indicated his/her understanding and acceptance.   Dental Advisory Given  Plan Discussed with: CRNA and Surgeon  Anesthesia Plan Comments:         Anesthesia Quick Evaluation

## 2013-06-13 NOTE — CV Procedure (Signed)
Electrical Cardioversion Procedure Note  Steven [name]   79 y.o. male MRN: [id number] DOB: 05-Feb-1927  Today's date: 06/13/2013  Procedure: Electrical Cardioversion  Indications:  Atrial Fibrillation  Time Out: Verified patient identification, verified procedure,medications/allergies/relevent history reviewed, required imaging and test results available.  Performed  Procedure Details  The patient was NPO after midnight. Anesthesia was administered at the beside  by Dr. Rica Mast with 100 mg of propofol.  Cardioversion was done with synchronized biphasic defibrillation with AP pads with 100, 150, and 200 watts.  The patient briefly converted to sinus rhythm for 3 beats after the second shock, but was not able to maintain NSR.   Unsuccessful cardioversion of atrial fibrillation   Recommendations:  Continue amiodarone load and repeat cardioversion after 1 month load.    Steven [name]. MD [facility]   06/13/2013, 4:34 PM

## 2013-06-13 NOTE — Progress Notes (Signed)
ANTICOAGULATION CONSULT NOTE - Follow Up Consult  Pharmacy Consult for Warfarin Indication: Hx of Afib, chronic Warfarin VTE prophylaxis s/p R TKR  Allergies  Allergen Reactions  . Hydrocodone Other (See Comments)    Extreme confusion  . Penicillins Hives   Patient Measurements: Height: 5' 8.11" (173 cm) Weight: 259 lb 7.7 oz (117.7 kg) IBW/kg (Calculated) : 68.65  Vital Signs: Temp: 98.3 F (36.8 C) (11/17 0457) Temp src: Oral (11/17 0457) BP: 117/70 mmHg (11/17 0457) Pulse Rate: 111 (11/17 0457)  Labs:  Recent Labs  06/11/13 0447 06/12/13 0445 06/13/13 0420  HGB 9.2*  --   --   HCT 27.7*  --   --   PLT 148*  --   --   LABPROT 23.5* 24.9* 26.6*  INR 2.17* 2.34* 2.55*  CREATININE  --   --  3.63*   Estimated Creatinine Clearance: 18.9 ml/min (by C-G formula based on Cr of 3.63).  Inpatient warfarin doses 11/10-11/16: 5, 5, 1.5, 2.5, 2.5, 2.5mg   Assessment: 79 yo M s/p R TKa on 06/06/13, with history of A.Fib on chronic anticoagulation with warfarin- home dose 5mg  on Sun,Wed; 2.5mg  other days with last dose 11/4. Resume warfarin post-operatively.   INR therapeutic today 2.55 and consistently trending upward on lower-than home dosage  CBC: Hgb improved and stable after 2 units PRBCs 11/11  Noted drug interaction with warfarin; however, may not see response in INR to increased dose for several days/weeks due to long half life.  If plan is to discharge patient on higher amiodarone dose, would reduce weekly warfarin dose with close INR follow up.  Would start at 2.5 mg daily without extra doses on Sundays/Wednesdays as PTA.  Goal of Therapy:  INR 2-3   Plan:   Warfarin 1.5 mg PO today  Daily PT/INR   Loralee Pacas, PharmD, BCPS Pager: [phone]  06/13/2013 8:50 AM

## 2013-06-13 NOTE — Progress Notes (Signed)
PT Cancellation Note  Patient Details Name: Steven [name] MRN: [id number] DOB: March 20, 1927   Cancelled Treatment:    Reason Eval/Treat Not Completed: Medical issues which prohibited therapyPt in afib, for possible cardioversion. Pt reports that e does not feel well.   [name] 06/13/2013, 12:50 PM [name] PT [phone]

## 2013-06-14 LAB — PROTIME-INR
INR: 2.73 — ABNORMAL HIGH (ref 0.00–1.49)
Prothrombin Time: 28 seconds — ABNORMAL HIGH (ref 11.6–15.2)

## 2013-06-14 MED ORDER — FERROUS SULFATE 325 (65 FE) MG PO TABS: 325.0000 mg | ORAL_TABLET | Freq: Three times a day (TID) | ORAL | Status: DC

## 2013-06-14 MED ORDER — AMIODARONE HCL 200 MG PO TABS: 400.0000 mg | ORAL_TABLET | Freq: Two times a day (BID) | ORAL | Status: DC

## 2013-06-14 MED ORDER — WARFARIN SODIUM 2.5 MG PO TABS: 2.5000 mg | ORAL_TABLET | Freq: Every day | ORAL | Status: DC

## 2013-06-14 MED ORDER — POLYETHYLENE GLYCOL 3350 17 G PO PACK: 17.0000 g | PACK | Freq: Two times a day (BID) | ORAL | Status: DC

## 2013-06-14 MED ORDER — DSS 100 MG PO CAPS: 100.0000 mg | ORAL_CAPSULE | Freq: Two times a day (BID) | ORAL | Status: DC

## 2013-06-14 MED ORDER — DILTIAZEM HCL ER COATED BEADS 240 MG PO CP24
240.0000 mg | ORAL_CAPSULE | Freq: Every day | ORAL | Status: DC
  Administered 2013-06-14: 240 mg via ORAL
  Filled 2013-06-14: qty 1

## 2013-06-14 MED ORDER — WARFARIN 0.5 MG HALF TABLET
0.5000 mg | ORAL_TABLET | Freq: Once | ORAL | Status: DC
  Filled 2013-06-14: qty 1

## 2013-06-14 MED ORDER — DILTIAZEM HCL ER COATED BEADS 240 MG PO CP24: 240.0000 mg | ORAL_CAPSULE | Freq: Every day | ORAL | Status: AC

## 2013-06-14 NOTE — Progress Notes (Signed)
   Subjective: 1 Day Post-Op Procedure(s) (LRB): CARDIOVERSION (N/A)   Patient reports pain as mild, pain well controlled. No events throughout the night. Cardioversion unsuccessful yesterday. Feels fine today. OK to go out to [facility] from a cardiac viewpoint. Rate is still somewhat fast. Not SOB, No chest pain.   Objective:   VITALS:   Filed Vitals:   06/14/13  BP: 114/65  Pulse: 108  Temp: 98.2 F (36.8 C)   Resp: 20    Neurovascular intact Dorsiflexion/Plantar flexion intact Incision: dressing C/D/I No cellulitis present Compartment soft  LABS No results found for this basename: HGB, HCT, WBC, PLT,  in the last 72 hours   Recent Labs  06/13/13 0420  CREATININE 3.63*     Assessment/Plan: 1 Day Post-Op Procedure(s) (LRB): CARDIOVERSION (N/A) Up with therapy Discharge to SNF today Follow up in 2 weeks at [facility]. Follow up with OLIN,Jory Welke D in 2 weeks.  Contact information:  [facility] [address], [address] [place] [address] [phone]      Recurrent atrial fibrillation in a patient with a prior history of paroxysmal atrial fibrillation unsuccessful cardioversion [facility] for discharge from Dr. York Spaniel standpoint Follow up with Dr. Donnie Aho in 2-3 weeks. Meds for a fib should be amiodarone 400 BID for one week then 200 BID, diltiazem 240 daily, and metoprolol succinate 100 mg daily. Warfarin should be therapeutic.   Anastasio Auerbach Marlow Hendrie   PAC  06/14/2013, 12:30 PM

## 2013-06-14 NOTE — Progress Notes (Signed)
Subjective:  Cardioversion unsuccessful yesterday.  Feels fine today.  OK to go out to [facility] from a cardiac viewpoint.  Rate is still somewhat fast. Not SOB, No chest pain.  Objective:  Vital Signs in the last 24 hours: BP 114/65  Pulse 108  Temp(Src) 98.2 F (36.8 C) (Oral)  Resp 18  Ht 5' 8.11" (1.73 m)  Wt 117.7 kg (259 lb 7.7 oz)  BMI 39.33 kg/m2  SpO2 96%  Physical Exam: Obese pleasant white male in no acute distress Lungs:  Clear  Cardiac: Irregular rhythm, normal S1-S2 no S3  Abdomen:  Soft, nontender, no masses Extremities:  No edema present  Intake/Output from previous day: 11/17 0701 - 11/18 0700 In: 2835.8 [P.O.:440; I.V.:2395.8] Out: 1555 [Urine:1555] Weight Filed Weights   06/06/13 1950 06/09/13 0400  Weight: 106.6 kg (235 lb 0.2 oz) 117.7 kg (259 lb 7.7 oz)    Lab Results: Basic Metabolic Panel:  Recent Labs  91/47/82 0420  CREATININE 3.63*    CBC: No results found for this basename: WBC, NEUTROABS, HGB, HCT, MCV, PLT,  in the last 72 hours  PROTIME: Lab Results  Component Value Date   INR 2.73* 06/14/2013   INR 2.55* 06/13/2013   INR 2.34* 06/12/2013    Telemetry: Atrial fibrillation with mild rapid response  Assessment/Plan:  1. Recurrent atrial fibrillation in a patient with a prior history of paroxysmal atrial fibrillation unsuccessful cardioversion 2. Stage IV chronic kidney disease 3. Hypertensive heart disease 4. Postoperative hip replacement with postoperative anemia  Recommendations:  1. OK to discharge 2.  Meds for a fib should be amiodarone 400 BID for one week then 200 BID, diltiazem 240 daily, and metoprolol succinate 100 mg daily. Warfarin should be therapeutic.    3. I need to see in f/u in 2-3 weeks.     [name]  MD [facility]  06/14/2013, 8:20 AM

## 2013-06-14 NOTE — Progress Notes (Signed)
Report called to [facility], Arlyss Queen, RN. Pt stable. Pt's family transporting to the facility.   Arta Bruce [facility]

## 2013-06-14 NOTE — Anesthesia Postprocedure Evaluation (Signed)
Anesthesia Post Note  Patient: Steven [name]  Procedure(s) Performed: Procedure(s) (LRB): CARDIOVERSION (N/A)  Anesthesia type: MAC  Patient location: PACU  Post pain: Pain level controlled  Post assessment: Post-op Vital signs reviewed  Last Vitals:  Filed Vitals:   06/14/13 0525  BP: 114/65  Pulse: 108  Temp: 36.8 C  Resp: 18    Post vital signs: Reviewed  Level of consciousness: sedated  Complications: No apparent anesthesia complications

## 2013-06-14 NOTE — Discharge Summary (Signed)
Physician Discharge Summary  Patient ID: Steven [name] MRN: [id number] DOB/AGE: 1926-08-08 79 y.o.  Admit date: 06/06/2013 Discharge date:  06/14/2013  Procedures:  Procedure(s) (LRB): CARDIOVERSION (N/A)  Attending Physician:  Dr. Durene Romans   Admission Diagnoses:   Right knee OA / pain  Discharge Diagnoses:  Principal Problem:   S/P right TKA Active Problems:   Acute blood loss anemia   Atrial fibrillation   Chronic kidney disease, stage IV (severe)   Long-term (current) use of anticoagulants  Past Medical History  Diagnosis Date  . Gout   . Hypertension   . Prostate ca   . Osteoarthritis   . PVC (premature ventricular contraction)   . HX: long term anticoagulant use   . Kidney stone   . CKD (chronic kidney disease), stage IV   . Anemia   . Hypercalcemia   . Vitamin D deficiency   . Atrial fibrillation   . Mitral valve disorders     HPI: Steven [name], 79 y.o. male, has a history of pain and functional disability in the right knee due to arthritis and has failed non-surgical conservative treatments for greater than 12 weeks to includeNSAID's and/or analgesics, corticosteriod injections and activity modification. Onset of symptoms was gradual, starting years ago with gradually worsening course since that time. The patient noted no past surgery on the right knee(s). Patient currently rates pain in the right knee(s) at 8 out of 10 with activity. Patient has night pain, worsening of pain with activity and weight bearing, pain that interferes with activities of daily living, pain with passive range of motion, crepitus and joint swelling. Patient has evidence of periarticular osteophytes and joint space narrowing by imaging studies. There is no active infection. Risks, benefits and expectations were discussed with the patient. Risks including but not limited to the risk of anesthesia, blood clots, nerve damage, blood vessel damage, failure of the prosthesis, infection  and up to and including death. Patient understand the risks, benefits and expectations and wishes to proceed with surgery.   PCP: Lenora Boys, MD   Discharged Condition:  fair  Hospital Course:  Patient underwent the above stated procedure on 06/06/2013. Patient tolerated the procedure well and brought to the recovery room in good condition and subsequently to the floor.  Throughout his stay he had no issues with his knee surgery.  While in the hospital he started to have increased heart rate and his Cardiologist was consulted.  Patient's medication were adjusted which did not convert his heart rate.  Monday 06/13/2013 Dr. Donnie Aho brought him to be cardioverted which was not successful. Even with an increased heart rate he has denied any chest pain or shortness of breath.  He was deemed medically clear for discharge.    Discharge Exam: General appearance: alert, cooperative and no distress Extremities: Homans sign is negative, no sign of DVT, no edema, redness or tenderness in the calves or thighs and no ulcers, gangrene or trophic changes  Disposition:     Home-Health Care Svc with follow up in 2 weeks   Follow-up Information   Follow up with [name], MD. Schedule an appointment as soon as possible for a visit in 2 weeks.   Specialty:  Orthopedic Surgery   Contact information:   [address] [place] [address] [phone]       Follow up with [name], MD. Schedule an appointment as soon as possible for a visit in 2 weeks.   Specialty:  Cardiology   Contact information:   [address] [place] [address] [phone]       Follow up with [name], MD. Schedule an appointment as soon as possible for a visit in 2 weeks.   Specialty:  Orthopedic Surgery   Contact information:   [address] [place] [address] [phone]       Discharge Orders   Future Orders Complete By Expires   Call  MD / Call 911  As directed    Comments:     If you experience chest pain or shortness of breath, CALL 911 and be transported to the hospital emergency room.  If you develope a fever above 101 F, pus (white drainage) or increased drainage or redness at the wound, or calf pain, call your surgeon's office.   Change dressing  As directed    Comments:     Maintain surgical dressing for 10-14 days, then change the dressing daily with sterile 4 x 4 inch gauze dressing and tape. Keep the area dry and clean.   Constipation Prevention  As directed    Comments:     Drink plenty of fluids.  Prune juice may be helpful.  You may use a stool softener, such as Colace (over the counter) 100 mg twice a day.  Use MiraLax (over the counter) for constipation as needed.   Diet - low sodium heart healthy  As directed    Discharge instructions  As directed    Comments:     Maintain surgical dressing for 10-14 days, then replace with gauze and tape. Keep the area dry and clean until follow up. Follow up in 2 weeks at [facility]. Call with any questions or concerns.   Increase activity slowly as tolerated  As directed    TED hose  As directed    Comments:     Use stockings (TED hose) for 2 weeks on both leg(s).  You may remove them at night for sleeping.   Weight bearing as tolerated  As directed    Questions:     Laterality:     Extremity:          Medication List         acetaminophen 500 MG tablet  Commonly known as:  TYLENOL  Take 1,000 mg by mouth every 6 (six) hours as needed for pain.     allopurinol 100 MG tablet  Commonly known as:  ZYLOPRIM  Take 100 mg by mouth daily.     amiodarone 200 MG tablet  Commonly known as:  PACERONE  Take 100 mg by mouth daily. Take half a pill mon, wed, fri     amiodarone 200 MG tablet  Commonly known as:  PACERONE  Take 2 tablets (400 mg total) by mouth 2 (two) times daily.     amLODipine 5 MG tablet  Commonly known as:  NORVASC  Take 5 mg by  mouth 2 (two) times daily.     atorvastatin 10 MG tablet  Commonly known as:  LIPITOR  Take 10 mg by mouth at bedtime.     cholecalciferol 1000 UNITS tablet  Commonly known as:  VITAMIN D  Take 1,000 Units by mouth every evening.     diltiazem 240 MG 24 hr capsule  Commonly known as:  CARDIZEM CD  Take 1 capsule (240 mg total) by mouth daily.     DSS 100 MG Caps  Take 100 mg by mouth 2 (two)  times daily.     ferrous sulfate 325 (65 FE) MG tablet  Take 1 tablet (325 mg total) by mouth 3 (three) times daily after meals.     furosemide 40 MG tablet  Commonly known as:  LASIX  Take 40 mg by mouth 2 (two) times daily. Morning and LUNCH     lisinopril 20 MG tablet  Commonly known as:  PRINIVIL,ZESTRIL  Take 20 mg by mouth daily.     medroxyPROGESTERone 5 MG tablet  Commonly known as:  PROVERA  Take 5 mg by mouth 2 (two) times daily.     metoprolol succinate 100 MG 24 hr tablet  Commonly known as:  TOPROL-XL  Take 100 mg by mouth daily. Take with or immediately following a meal.     mirtazapine 15 MG tablet  Commonly known as:  REMERON  Take 15 mg by mouth at bedtime.     polyethylene glycol packet  Commonly known as:  MIRALAX / GLYCOLAX  Take 17 g by mouth 2 (two) times daily.     warfarin 2.5 MG tablet  Commonly known as:  COUMADIN  Take 1 tablet (2.5 mg total) by mouth daily at 6 PM.         Signed: Anastasio Auerbach. Brekyn Huntoon   PAC  06/14/2013, 12:58 PM

## 2013-06-14 NOTE — Progress Notes (Signed)
ANTICOAGULATION CONSULT NOTE - Follow Up Consult  Pharmacy Consult for Warfarin Indication: Hx of Afib, chronic Warfarin VTE prophylaxis s/p R TKR  Allergies  Allergen Reactions  . Hydrocodone Other (See Comments)    Extreme confusion  . Penicillins Hives   Patient Measurements: Height: 5' 8.11" (173 cm) Weight: 259 lb 7.7 oz (117.7 kg) IBW/kg (Calculated) : 68.65  Vital Signs: Temp: 98.2 F (36.8 C) (11/18 0525) Temp src: Oral (11/18 0525) BP: 114/65 mmHg (11/18 0525) Pulse Rate: 108 (11/18 0525)  Labs:  Recent Labs  06/12/13 0445 06/13/13 0420 06/14/13 0430  LABPROT 24.9* 26.6* 28.0*  INR 2.34* 2.55* 2.73*  CREATININE  --  3.63*  --    Estimated Creatinine Clearance: 18.9 ml/min (by C-G formula based on Cr of 3.63).  Inpatient warfarin doses 11/10-11/17: 5, 5, 1.5, 2.5, 2.5, 2.5, 1.5mg   Assessment: 79 yo M s/p R TKa on 06/06/13, with history of A.Fib on chronic anticoagulation with warfarin- home dose 5mg  on Sun,Wed; 2.5mg  other days with last dose 11/4. Resume warfarin post-operatively.   INR therapeutic today 2.73 and consistently trending upward on lower-than home dosage  CBC 11/5: Hgb improved and stable after 2 units PRBCs 11/11  Noted drug interaction with amiodarone; however, may not see response in INR to increased dose for several days/weeks due to long half life.  Since plan is to discharge patient on higher amiodarone dose, would reduce weekly warfarin dose with close INR follow up.  Would start at 2.5 mg daily without extra doses on Sundays/Wednesdays as PTA.  Goal of Therapy:  INR 2-3   Plan:   Warfarin 0.5 mg PO today  Daily PT/INR   Loralee Pacas, PharmD, BCPS Pager: [phone]  06/14/2013 8:10 AM

## 2013-06-14 NOTE — Clinical Social Work Placement (Signed)
     Clinical Social Work Department CLINICAL SOCIAL WORK PLACEMENT NOTE 06/14/2013  Patient:  Steven [name], Steven [name]  Account Number:  [id number] Admit date:  06/06/2013  Clinical Social Worker:  Cori Razor, LCSW  Date/time:  06/07/2013 02:58 PM  Clinical Social Work is seeking post-discharge placement for this patient at the following level of care:   SKILLED NURSING   (*CSW will update this form in Epic as items are completed)   06/07/2013  Patient/family provided with [facility] Department of Clinical Social Works list of facilities offering this level of care within the geographic area requested by the patient (or if unable, by the patients family).  06/07/2013  Patient/family informed of their freedom to choose among providers that offer the needed level of care, that participate in Medicare, Medicaid or managed care program needed by the patient, have an available bed and are willing to accept the patient.  06/07/2013  Patient/family informed of [facility] ownership interest in [facility], as well as of the fact that they are under no obligation to receive care at this facility.  PASARR submitted to EDS on 06/07/2013 PASARR number received from EDS on 06/07/2013  FL2 transmitted to all facilities in geographic area requested by pt/family on  06/07/2013 FL2 transmitted to all facilities within larger geographic area on   Patient informed that his/her managed care company has contracts with or will negotiate with  certain facilities, including the following:     Patient/family informed of bed offers received:  06/08/2013 Patient chooses bed at [facility] Physician recommends and patient chooses bed at    Patient to be transferred to [facility] on  06/14/2013 Patient to be transferred to facility by family  The following physician request were entered in Epic:   Additional Comments:

## 2013-06-14 NOTE — Progress Notes (Signed)
Patient is cleared for discharge. Janie at [facility] has authorization and paperwork completed. Packet copied and placed in [place]. Met with patient and family at bedside. Patient stated that he has had great care while he has been here in the hospital. He is sad to be leaving but hopeful that rehab will help him. He is agreeable to transfer. He states that his family will transport him and does not need an ambulance. No further CSW needs noted.  Keino Placencia C. Nahomi Hegner MSW, LCSW [phone]

## 2013-06-14 NOTE — Progress Notes (Signed)
Patient ambulated in the room with the walker. Patient ambulated to the bathroom and had a formed BM. The rest of the night the patient had a quiet, restful night.  Diet was advanced to clear liquids as per order. When patient is able to tolerate clears the diet will be advanced as per order.

## 2013-09-19 ENCOUNTER — Other Ambulatory Visit: Payer: Self-pay | Admitting: Urology

## 2013-09-19 DIAGNOSIS — C61 Malignant neoplasm of prostate: Secondary | ICD-10-CM

## 2013-09-30 ENCOUNTER — Ambulatory Visit (HOSPITAL_COMMUNITY)
Admission: RE | Admit: 2013-09-30 | Discharge: 2013-09-30 | Disposition: A | Discharge status: Home / Self Care | Payer: Medicare PPO | Source: Ambulatory Visit | Attending: Urology | Admitting: Urology

## 2013-09-30 ENCOUNTER — Encounter (HOSPITAL_COMMUNITY): Payer: Self-pay

## 2013-09-30 DIAGNOSIS — Z96659 Presence of unspecified artificial knee joint: Secondary | ICD-10-CM | POA: Insufficient documentation

## 2013-09-30 DIAGNOSIS — R972 Elevated prostate specific antigen [PSA]: Secondary | ICD-10-CM | POA: Insufficient documentation

## 2013-09-30 DIAGNOSIS — C61 Malignant neoplasm of prostate: Secondary | ICD-10-CM | POA: Insufficient documentation

## 2013-09-30 MED ORDER — TECHNETIUM TC 99M MEDRONATE IV KIT
26.5000 | PACK | Freq: Once | INTRAVENOUS | Status: AC | PRN
  Administered 2013-09-30: 26.5 via INTRAVENOUS

## 2013-11-30 ENCOUNTER — Other Ambulatory Visit: Payer: Self-pay | Admitting: Cardiology

## 2013-11-30 DIAGNOSIS — N184 Chronic kidney disease, stage 4 (severe): Secondary | ICD-10-CM

## 2013-12-02 ENCOUNTER — Ambulatory Visit
Admission: RE | Admit: 2013-12-02 | Discharge: 2013-12-02 | Disposition: A | Discharge status: Home / Self Care | Payer: Medicare PPO | Source: Ambulatory Visit | Attending: Cardiology | Admitting: Cardiology

## 2013-12-02 DIAGNOSIS — N184 Chronic kidney disease, stage 4 (severe): Secondary | ICD-10-CM

## 2014-10-13 ENCOUNTER — Other Ambulatory Visit: Payer: Self-pay | Admitting: Urology

## 2014-10-13 DIAGNOSIS — C61 Malignant neoplasm of prostate: Secondary | ICD-10-CM

## 2014-11-02 ENCOUNTER — Encounter (HOSPITAL_COMMUNITY)
Admission: RE | Admit: 2014-11-02 | Discharge: 2014-11-02 | Disposition: A | Discharge status: Home / Self Care | Payer: Medicare PPO | Source: Ambulatory Visit | Attending: Urology | Admitting: Urology

## 2014-11-02 DIAGNOSIS — C61 Malignant neoplasm of prostate: Secondary | ICD-10-CM | POA: Insufficient documentation

## 2014-11-02 MED ORDER — TECHNETIUM TC 99M MEDRONATE IV KIT
27.3000 | PACK | Freq: Once | INTRAVENOUS | Status: AC | PRN
  Administered 2014-11-02: 27.3 via INTRAVENOUS

## 2014-11-13 ENCOUNTER — Telehealth: Payer: Self-pay | Admitting: Oncology

## 2014-11-13 NOTE — Telephone Encounter (Signed)
NEW PATIENT APPT-CALLED [facility] TO GIVE NP APPT PER [facility] WILL CALL BACK AFTER SPEAKING WITH MR. Tinnell.  REFERRING OFFICE IS AWARE

## 2014-11-28 ENCOUNTER — Ambulatory Visit: Payer: Medicare PPO

## 2014-11-28 ENCOUNTER — Encounter: Payer: Self-pay | Admitting: Oncology

## 2014-11-28 ENCOUNTER — Telehealth: Payer: Self-pay | Admitting: Oncology

## 2014-11-28 ENCOUNTER — Ambulatory Visit (HOSPITAL_BASED_OUTPATIENT_CLINIC_OR_DEPARTMENT_OTHER): Payer: Medicare PPO | Admitting: Oncology

## 2014-11-28 ENCOUNTER — Other Ambulatory Visit: Payer: Medicare PPO

## 2014-11-28 VITALS — BP 127/54 | HR 79 | Temp 98.1°F | Resp 19 | Ht 68.0 in | Wt 214.0 lb

## 2014-11-28 DIAGNOSIS — M25569 Pain in unspecified knee: Secondary | ICD-10-CM | POA: Diagnosis not present

## 2014-11-28 DIAGNOSIS — Z992 Dependence on renal dialysis: Secondary | ICD-10-CM

## 2014-11-28 DIAGNOSIS — N186 End stage renal disease: Secondary | ICD-10-CM | POA: Diagnosis not present

## 2014-11-28 DIAGNOSIS — C61 Malignant neoplasm of prostate: Secondary | ICD-10-CM

## 2014-11-28 DIAGNOSIS — I4891 Unspecified atrial fibrillation: Secondary | ICD-10-CM

## 2014-11-28 NOTE — Progress Notes (Signed)
Please see consult note.  

## 2014-11-28 NOTE — Telephone Encounter (Signed)
per pof to sch pt appt-pt req to mail copy-cld & spoke to p

## 2014-11-28 NOTE — Consult Note (Signed)
Reason for Referral: Prostate cancer.   HPI: 81 year old gentleman currently of [place] where he lived at the 44 of his life. He is a gentleman with history of atrial fibrillation and end-stage renal disease currently on hemodialysis. He was diagnosed with the prostate cancer in July 2011. He had a Gleason score of 4+4 = 8 and a PSA 15.3. His initial clinical staging was T2c without any evidence of metastasis. He was treated under the care of Dr. Jeffie Pollock with androgen deprivation only in the form of Lupron. He had an excellent response in his PSA but since 2014 and has been on a slow rise. It was 2.07 in March 2014. In March 2015 was up to 5.33. In September 2015 was up to 5.5 followed by by December it was up to 9.38. In April 2016 his PSA was up to 11.8. He underwent staging workup including a CT scan of the bone scan which did not show any evidence of measurable disease. His bone scan was on 11/02/2014 and his last CT scan done on 11/02/2014. He is asymptomatic from his prostate cancer at this time. He continues to live independently and uses walker for ambulation. He does not drive at this time. He does not report any headaches, blurry vision, syncope or seizures. He does not report any fevers, chills, sweats or weight loss. He does not report any chest pain, palpitation. He does not report any orthopnea or increased leg edema. He does not report any cough or hemoptysis or wheezing. He does not report any nausea, vomiting, abdominal pain but does report occasional constipation. He does not report any hematochezia or melena. He does not report any frequency, urgency or hematuria. He does report knee pain has been chronic and arthritic in nature. Remaining review of system is unremarkable.   Past Medical History  Diagnosis Date  . Gout   . Hypertension   . Prostate ca   . Osteoarthritis   . PVC (premature ventricular contraction)   . HX: long term anticoagulant use   . Kidney stone   . CKD  (chronic kidney disease), stage IV   . Anemia   . Hypercalcemia   . Vitamin D deficiency   . Atrial fibrillation   . Mitral valve disorders   :  Past Surgical History  Procedure Laterality Date  . Kidney stone surgery    . Joint replacement Left 2012    knee  . Eye surgery Bilateral     cataract extraction with IOL  . Sp av dialysis shunt access existing *l* Left     no dialysis  . Total knee arthroplasty Right 06/06/2013    Procedure: RIGHT TOTAL KNEE ARTHROPLASTY;  Surgeon: Mauri Pole, MD;  Location: [facility];  Service: Orthopedics;  Laterality: Right;  :   Current outpatient prescriptions:  .  acetaminophen (TYLENOL) 500 MG tablet, Take 1,000 mg by mouth every 6 (six) hours as needed for pain., Disp: , Rfl:  .  allopurinol (ZYLOPRIM) 100 MG tablet, Take 100 mg by mouth daily. , Disp: , Rfl:  .  amiodarone (PACERONE) 200 MG tablet, Take 2 tablets (400 mg total) by mouth 2 (two) times daily., Disp: 50 tablet, Rfl: 0 .  amLODipine (NORVASC) 5 MG tablet, Take 5 mg by mouth 2 (two) times daily. , Disp: , Rfl:  .  atorvastatin (LIPITOR) 10 MG tablet, Take 10 mg by mouth at bedtime. , Disp: , Rfl:  .  cholecalciferol (VITAMIN D) 1000 UNITS tablet, Take 1,000 Units by  mouth every evening. , Disp: , Rfl:  .  diltiazem (CARDIZEM CD) 240 MG 24 hr capsule, Take 1 capsule (240 mg total) by mouth daily., Disp: 30 capsule, Rfl: 0 .  docusate sodium 100 MG CAPS, Take 100 mg by mouth 2 (two) times daily., Disp: 10 capsule, Rfl: 0 .  ferrous sulfate 325 (65 FE) MG tablet, Take 1 tablet (325 mg total) by mouth 3 (three) times daily after meals., Disp: , Rfl: 3 .  furosemide (LASIX) 40 MG tablet, Take 40 mg by mouth 2 (two) times daily. Morning and LUNCH, Disp: , Rfl:  .  levothyroxine (SYNTHROID, LEVOTHROID) 50 MCG tablet, Take 50 mcg by mouth daily., Disp: , Rfl:  .  lisinopril (PRINIVIL,ZESTRIL) 20 MG tablet, Take 20 mg by mouth daily., Disp: , Rfl:  .  medroxyPROGESTERone (PROVERA) 5 MG  tablet, Take 5 mg by mouth 2 (two) times daily. , Disp: , Rfl:  .  metoprolol succinate (TOPROL-XL) 50 MG 24 hr tablet, Take 50 mg by mouth daily., Disp: , Rfl:  .  mirtazapine (REMERON) 15 MG tablet, Take 15 mg by mouth at bedtime. , Disp: , Rfl:  .  polyethylene glycol (MIRALAX / GLYCOLAX) packet, Take 17 g by mouth 2 (two) times daily., Disp: 14 each, Rfl: 0 .  RENVELA 800 MG tablet, Take 800 mg by mouth 3 (three) times daily with meals., Disp: , Rfl:  .  warfarin (COUMADIN) 5 MG tablet, Take 5 mg by mouth daily., Disp: , Rfl: :  Allergies  Allergen Reactions  . Hydrocodone Other (See Comments)    Extreme confusion  . Penicillins Hives  :  No family history on file.:  History   Social History  . Marital Status: Widowed    Spouse Name: N/A  . Number of Children: N/A  . Years of Education: N/A   Occupational History  . Not on file.   Social History Main Topics  . Smoking status: Passive Smoke Exposure - Never Smoker    Types: Cigars  . Smokeless tobacco: Former Systems developer  . Alcohol Use: Yes     Comment: attended [employer]  . Drug Use: No  . Sexual Activity: Not on file   Other Topics Concern  . Not on file   Social History Narrative  . No narrative on file  :  Pertinent items are noted in HPI.  Exam: Blood pressure 127/54, pulse 79, temperature 98.1 F (36.7 C), temperature source Oral, resp. rate 19, height 5\' 8"  (1.727 m), weight 214 lb (97.07 kg), SpO2 98 %. General appearance: alert and cooperative Head: Normocephalic, without obvious abnormality Throat: lips, mucosa, and tongue normal; teeth and gums normal Neck: no adenopathy Back: negative Resp: clear to auscultation bilaterally Chest wall: no tenderness Cardio: regular rate and rhythm, S1, S2 normal, no murmur, click, rub or gallop GI: soft, non-tender; bowel sounds normal; no masses,  no organomegaly Extremities: extremities normal, atraumatic, no cyanosis or edema Pulses: 2+ and symmetric Skin: Skin  color, texture, turgor normal. No rashes or lesions Lymph nodes: Cervical, supraclavicular, and axillary nodes normal.  CBC    Component Value Date/Time   WBC 8.8 06/11/2013 0447   RBC 2.95* 06/11/2013 0447   HGB 9.2* 06/11/2013 0447   HCT 27.7* 06/11/2013 0447   PLT 148* 06/11/2013 0447   MCV 93.9 06/11/2013 0447   MCH 31.2 06/11/2013 0447   MCHC 33.2 06/11/2013 0447   RDW 15.7* 06/11/2013 0447   LYMPHSABS 2.1 04/24/2011 1510   MONOABS 0.6  04/24/2011 1510   EOSABS 0.7 04/24/2011 1510   BASOSABS 0.0 04/24/2011 1510      Chemistry      Component Value Date/Time   NA 135 06/09/2013 0443   K 5.0 06/09/2013 0443   CL 103 06/09/2013 0443   CO2 23 06/09/2013 0443   BUN 75* 06/09/2013 0443   CREATININE 3.63* 06/13/2013 0420      Component Value Date/Time   CALCIUM 9.7 06/09/2013 0443   ALKPHOS 61 04/04/2011 0520   AST 16 04/04/2011 0520   ALT 9 04/04/2011 0520   BILITOT 0.3 04/04/2011 0520       Nm Bone Scan Whole Body  11/02/2014   CLINICAL DATA:  History of prostate malignancy, low back pain; no history of trauma or surgery  EXAM: NUCLEAR MEDICINE WHOLE BODY BONE SCAN  TECHNIQUE: Whole body anterior and posterior images were obtained approximately 3 hours after intravenous injection of radiopharmaceutical.  RADIOPHARMACEUTICALS:  27.3 mCi Technetium-99 MDP  COMPARISON:  Nuclear bone scan of September 30, 2013 and February 15, 2010.  FINDINGS: There is adequate uptake of the radiopharmaceutical by the skeleton. Adequate soft tissue clearance and renal activity is demonstrated. Uptake about the bilateral knee prostheses is within the limits of normal. Mildly increased uptake about the ankles and feet is consistent with degenerative change. Uptake over the calvarium, spine, ribs, and pelvis is normal. The visualized portions of the upper extremities are unremarkable. There is stable mildly mild increased uptake in the anterior aspects of the first ribs as they articulate with the sternum.   IMPRESSION: There are no findings suspicious for metastatic disease to the bones.   Electronically Signed   By: David  Martinique   On: 11/02/2014 14:13    Assessment and Plan:    81 year old gentleman with the following issues:  1. Prostate cancer diagnosed in July 2011 with a Gleason score 4+4 = 8 and a PSA of 15.3. He was treated with androgen deprivation only with an excellent response initially. Most recently his PSA started to rise and in April 2016 his PSA was 11.83 with a testosterone of 12. His PSA in September 2015 was around 5.5. Imaging studies including a bone scan and CT scan did not show any evidence of measurable disease.  The natural course of castration resistant prostate cancer was discussed today with the patient. He appears to have a rather slow rise with a doubling time slightly higher than 6 months. The fact that he has no measurable disease also help in making the decision of options of treatment.  Options of treatments were discussed today including continued observation given the slow rise in the PSA and his multiple comorbid conditions. Second line hormonal manipulation would be an option as well. Casodex will be an option if has not been used in the past. There is no dose modification because of his end-stage renal disease. I do not see that he has been actively treated with Casodex and a negative would be a reasonable option. If Casodex have been used in the past, then Peosta will be a reasonable option at this time. There is no dosing modification recommendation and renal failure and hemodialysis although distraught has not been studied in this particular setting.  I do not recommend using ketoconazole or Zytiga given his cardiac medication for possible drug drug interaction.  I will communicate these findings with Dr. Jeffie Pollock and I have tentatively scheduled Mr. Solanki for a follow-up in September after his next follow up with Dr. Jeffie Pollock.  2. End-stage renal disease: He is  currently on hemodialysis seems to be well tolerated.  3. Follow-up: In September of 2016 or sooner if needed to.

## 2014-11-28 NOTE — Progress Notes (Signed)
Checked in new pt with no financial concerns prior to seeing the dr.  Pt has my card for any billing questions or concerns. ° °

## 2015-03-26 ENCOUNTER — Telehealth: Payer: Self-pay | Admitting: Oncology

## 2015-03-26 NOTE — Telephone Encounter (Signed)
Pt's wife called to cancelled until he see's Dr. Jeffie Pollock then they will c/b to r/s..... KJ

## 2015-04-03 ENCOUNTER — Ambulatory Visit: Payer: Medicare PPO | Admitting: Oncology

## 2015-07-20 ENCOUNTER — Encounter: Payer: Self-pay | Admitting: *Deleted

## 2015-09-27 ENCOUNTER — Other Ambulatory Visit: Payer: Self-pay | Admitting: Cardiology

## 2015-09-27 ENCOUNTER — Ambulatory Visit
Admission: RE | Admit: 2015-09-27 | Discharge: 2015-09-27 | Disposition: A | Discharge status: Home / Self Care | Payer: Medicare Other | Source: Ambulatory Visit | Attending: Cardiology | Admitting: Cardiology

## 2015-09-27 DIAGNOSIS — I480 Paroxysmal atrial fibrillation: Secondary | ICD-10-CM

## 2016-01-31 ENCOUNTER — Ambulatory Visit (HOSPITAL_COMMUNITY)
Admission: RE | Admit: 2016-01-31 | Discharge: 2016-01-31 | Disposition: A | Discharge status: Home / Self Care | Payer: Medicare Other | Source: Ambulatory Visit | Attending: Family Medicine | Admitting: Family Medicine

## 2016-01-31 ENCOUNTER — Other Ambulatory Visit: Payer: Self-pay | Admitting: Cardiology

## 2016-01-31 DIAGNOSIS — Z87891 Personal history of nicotine dependence: Secondary | ICD-10-CM | POA: Diagnosis not present

## 2016-01-31 DIAGNOSIS — I4819 Other persistent atrial fibrillation: Secondary | ICD-10-CM

## 2016-01-31 DIAGNOSIS — I481 Persistent atrial fibrillation: Secondary | ICD-10-CM | POA: Diagnosis not present

## 2016-01-31 DIAGNOSIS — I10 Essential (primary) hypertension: Secondary | ICD-10-CM | POA: Insufficient documentation

## 2016-01-31 DIAGNOSIS — I351 Nonrheumatic aortic (valve) insufficiency: Secondary | ICD-10-CM | POA: Diagnosis not present

## 2016-01-31 NOTE — Progress Notes (Signed)
  Echocardiogram 2D Echocardiogram has been performed.  Tresa Res 01/31/2016, 4:17 PM

## 2016-07-03 ENCOUNTER — Other Ambulatory Visit: Payer: Self-pay | Admitting: Urology

## 2016-07-03 DIAGNOSIS — Z192 Hormone resistant malignancy status: Principal | ICD-10-CM

## 2016-07-03 DIAGNOSIS — C61 Malignant neoplasm of prostate: Secondary | ICD-10-CM

## 2016-07-15 ENCOUNTER — Encounter (HOSPITAL_COMMUNITY)
Admission: RE | Admit: 2016-07-15 | Discharge: 2016-07-15 | Disposition: A | Discharge status: Home / Self Care | Payer: Medicare Other | Source: Ambulatory Visit | Attending: Urology | Admitting: Urology

## 2016-07-15 DIAGNOSIS — Z192 Hormone resistant malignancy status: Secondary | ICD-10-CM | POA: Insufficient documentation

## 2016-07-15 DIAGNOSIS — C61 Malignant neoplasm of prostate: Secondary | ICD-10-CM | POA: Diagnosis not present

## 2016-07-15 MED ORDER — TECHNETIUM TC 99M MEDRONATE IV KIT
20.2000 | PACK | Freq: Once | INTRAVENOUS | Status: AC | PRN
  Administered 2016-07-15: 20.2 via INTRAVENOUS

## 2016-10-14 ENCOUNTER — Telehealth: Payer: Self-pay | Admitting: Oncology

## 2016-10-14 NOTE — Telephone Encounter (Signed)
Tc to schedule an appt. Lft a vm for the pt to cb.

## 2016-10-27 ENCOUNTER — Telehealth: Payer: Self-pay | Admitting: Oncology

## 2016-10-27 NOTE — Telephone Encounter (Signed)
Called patient to inform him of changes in appointments. Patient aware of new dates/times.

## 2016-10-29 ENCOUNTER — Emergency Department (HOSPITAL_COMMUNITY)
Admission: EM | Admit: 2016-10-29 | Discharge: 2016-10-29 | Disposition: A | Discharge status: Home / Self Care | Payer: Medicare Other | Attending: Emergency Medicine | Admitting: Emergency Medicine

## 2016-10-29 ENCOUNTER — Encounter (HOSPITAL_COMMUNITY): Payer: Self-pay | Admitting: Emergency Medicine

## 2016-10-29 DIAGNOSIS — I129 Hypertensive chronic kidney disease with stage 1 through stage 4 chronic kidney disease, or unspecified chronic kidney disease: Secondary | ICD-10-CM | POA: Insufficient documentation

## 2016-10-29 DIAGNOSIS — Z96651 Presence of right artificial knee joint: Secondary | ICD-10-CM | POA: Diagnosis not present

## 2016-10-29 DIAGNOSIS — N184 Chronic kidney disease, stage 4 (severe): Secondary | ICD-10-CM | POA: Diagnosis not present

## 2016-10-29 DIAGNOSIS — Z7901 Long term (current) use of anticoagulants: Secondary | ICD-10-CM | POA: Diagnosis not present

## 2016-10-29 DIAGNOSIS — Z8546 Personal history of malignant neoplasm of prostate: Secondary | ICD-10-CM | POA: Diagnosis not present

## 2016-10-29 DIAGNOSIS — R339 Retention of urine, unspecified: Secondary | ICD-10-CM | POA: Insufficient documentation

## 2016-10-29 DIAGNOSIS — Z7722 Contact with and (suspected) exposure to environmental tobacco smoke (acute) (chronic): Secondary | ICD-10-CM | POA: Insufficient documentation

## 2016-10-29 DIAGNOSIS — R300 Dysuria: Secondary | ICD-10-CM | POA: Diagnosis present

## 2016-10-29 HISTORY — DX: Benign prostatic hyperplasia without lower urinary tract symptoms: N40.0

## 2016-10-29 LAB — COMPREHENSIVE METABOLIC PANEL
ALK PHOS: 96 U/L (ref 38–126)
ALT: 11 U/L — ABNORMAL LOW (ref 17–63)
ANION GAP: 14 (ref 5–15)
AST: 19 U/L (ref 15–41)
Albumin: 3.6 g/dL (ref 3.5–5.0)
BUN: 43 mg/dL — ABNORMAL HIGH (ref 6–20)
CALCIUM: 8.7 mg/dL — AB (ref 8.9–10.3)
CO2: 29 mmol/L (ref 22–32)
Chloride: 98 mmol/L — ABNORMAL LOW (ref 101–111)
Creatinine, Ser: 9.54 mg/dL — ABNORMAL HIGH (ref 0.61–1.24)
GFR calc non Af Amer: 4 mL/min — ABNORMAL LOW (ref >60)
GFR, EST AFRICAN AMERICAN: 5 mL/min — AB (ref >60)
Glucose, Bld: 103 mg/dL — ABNORMAL HIGH (ref 65–99)
Potassium: 4.1 mmol/L (ref 3.5–5.1)
Sodium: 141 mmol/L (ref 135–145)
Total Bilirubin: 0.8 mg/dL (ref 0.3–1.2)
Total Protein: 6.9 g/dL (ref 6.5–8.1)

## 2016-10-29 LAB — URINALYSIS, ROUTINE W REFLEX MICROSCOPIC
Bilirubin Urine: NEGATIVE
Glucose, UA: NEGATIVE mg/dL
HGB URINE DIPSTICK: NEGATIVE
Ketones, ur: NEGATIVE mg/dL
NITRITE: NEGATIVE
PROTEIN: 100 mg/dL — AB
Specific Gravity, Urine: 1.013 (ref 1.005–1.030)
pH: 8 (ref 5.0–8.0)

## 2016-10-29 LAB — CBC WITH DIFFERENTIAL/PLATELET
Basophils Absolute: 0 10*3/uL (ref 0.0–0.1)
Basophils Relative: 0 %
Eosinophils Absolute: 0.4 10*3/uL (ref 0.0–0.7)
Eosinophils Relative: 7 %
HCT: 37.7 % — ABNORMAL LOW (ref 39.0–52.0)
HEMOGLOBIN: 12 g/dL — AB (ref 13.0–17.0)
LYMPHS ABS: 1.4 10*3/uL (ref 0.7–4.0)
Lymphocytes Relative: 28 %
MCH: 30.5 pg (ref 26.0–34.0)
MCHC: 31.8 g/dL (ref 30.0–36.0)
MCV: 95.9 fL (ref 78.0–100.0)
Monocytes Absolute: 0.4 10*3/uL (ref 0.1–1.0)
Monocytes Relative: 9 %
NEUTROS PCT: 56 %
Neutro Abs: 2.9 10*3/uL (ref 1.7–7.7)
Platelets: 137 10*3/uL — ABNORMAL LOW (ref 150–400)
RBC: 3.93 MIL/uL — AB (ref 4.22–5.81)
RDW: 16.1 % — ABNORMAL HIGH (ref 11.5–15.5)
WBC: 5.2 10*3/uL (ref 4.0–10.5)

## 2016-10-29 NOTE — ED Notes (Signed)
Pt attempted to give urine sample but was unsuccessful. Urinal and call light at bedside.

## 2016-10-29 NOTE — ED Triage Notes (Signed)
Per pt, states he has been having some dysuria-states he is a dialysis patient-states he still makes a little urine-states burning with urination-states he has urologist and they said it might be related to prostate-states he was told to come to [facility] ED for eval-last dialysis was Monday-due today

## 2016-10-29 NOTE — ED Provider Notes (Signed)
[facility] DEPT Provider Note   CSN: [id number] Arrival date & time: 10/29/16  1038     History   Chief Complaint Chief Complaint  Patient presents with  . Dysuria    HPI  Steven [name] is a 83 y.o. male who presents with dysuria. PMH significant for A fib, ESRD currently on hemodialysis (M, W, F) - last dialyzed Monday, prostate cancer. He states he stopped peeing about 2 months ago which he attributed to being on dialysis. He started to have suprapubic pain intermittently since then. He also would urinate occassionally but was mostly anuric. He was treated for a "kidney infection" and put on antibiotics several weeks ago which provided no relief. Yesterday he had an episode of dysuria. Today he has not peed. His urologist is Dr. Jeffie Pollock who recommended him to come to the ED. His nephrologist is Dr. Olivia Mackie. No fever, chills, chest pain, SOB, upper abdominal pain, flank pain, N/V/D.   HPI  Past Medical History:  Diagnosis Date  . Anemia   . Atrial fibrillation ([place])   . BPH (benign prostatic hyperplasia)   . CKD (chronic kidney disease), stage IV ([place])   . Gout   . HX: long term anticoagulant use   . Hypercalcemia   . Hypertension   . Kidney stone   . Mitral valve disorders(424.0)   . Osteoarthritis   . Prostate CA ([place])   . PVC (premature ventricular contraction)   . Vitamin D deficiency     Patient Active Problem List   Diagnosis Date Noted  . Chronic kidney disease, stage IV (severe) ([place])   . Long-term (current) use of anticoagulants   . Hyperlipidemia   . Prostate cancer ([place])   . Acute blood loss anemia 06/08/2013  . Obesity (BMI 30-39.9)   . Atrial fibrillation ([place])   . Hypertensive heart disease   . Mitral valve disorders(424.0)   . S/P right TKA 06/06/2013    Past Surgical History:  Procedure Laterality Date  . EYE SURGERY Bilateral    cataract extraction with IOL  . JOINT REPLACEMENT Left 2012   knee  . KIDNEY STONE SURGERY    . SP AV  DIALYSIS SHUNT ACCESS EXISTING *L* Left    no dialysis  . TOTAL KNEE ARTHROPLASTY Right 06/06/2013   Procedure: RIGHT TOTAL KNEE ARTHROPLASTY;  Surgeon: Mauri Pole, MD;  Location: [facility];  Service: Orthopedics;  Laterality: Right;       Home Medications    Prior to Admission medications   Medication Sig Start Date End Date Taking? Authorizing Provider  acetaminophen (TYLENOL) 500 MG tablet Take 1,000 mg by mouth every 6 (six) hours as needed for pain.    Historical Provider, MD  allopurinol (ZYLOPRIM) 100 MG tablet Take 100 mg by mouth daily.     Historical Provider, MD  amiodarone (PACERONE) 200 MG tablet Take 2 tablets (400 mg total) by mouth 2 (two) times daily. 06/14/13   [name], PA-C  amLODipine (NORVASC) 5 MG tablet Take 5 mg by mouth 2 (two) times daily.     Historical Provider, MD  atorvastatin (LIPITOR) 10 MG tablet Take 10 mg by mouth at bedtime.     Historical Provider, MD  cholecalciferol (VITAMIN D) 1000 UNITS tablet Take 1,000 Units by mouth every evening.     Historical Provider, MD  diltiazem (CARDIZEM CD) 240 MG 24 hr capsule Take 1 capsule (240 mg total) by mouth daily. 06/14/13   [name], PA-C  docusate sodium 100 MG  CAPS Take 100 mg by mouth 2 (two) times daily. 06/14/13   [name], PA-C  ferrous sulfate 325 (65 FE) MG tablet Take 1 tablet (325 mg total) by mouth 3 (three) times daily after meals. 06/14/13   [name], PA-C  furosemide (LASIX) 40 MG tablet Take 40 mg by mouth 2 (two) times daily. Morning and LUNCH    Historical Provider, MD  levothyroxine (SYNTHROID, LEVOTHROID) 50 MCG tablet Take 50 mcg by mouth daily. 11/04/14   Historical Provider, MD  lisinopril (PRINIVIL,ZESTRIL) 20 MG tablet Take 20 mg by mouth daily.    Historical Provider, MD  medroxyPROGESTERone (PROVERA) 5 MG tablet Take 5 mg by mouth 2 (two) times daily.     Historical Provider, MD  metoprolol succinate (TOPROL-XL) 50 MG 24 hr tablet Take 50 mg by mouth daily.  11/02/14   Historical Provider, MD  mirtazapine (REMERON) 15 MG tablet Take 15 mg by mouth at bedtime.     Historical Provider, MD  polyethylene glycol (MIRALAX / GLYCOLAX) packet Take 17 g by mouth 2 (two) times daily. 06/14/13   [name], PA-C  RENVELA 800 MG tablet Take 800 mg by mouth 3 (three) times daily with meals. 11/07/14   Historical Provider, MD  warfarin (COUMADIN) 5 MG tablet Take 5 mg by mouth daily. 11/04/14   Historical Provider, MD    Family History No family history on file.  Social History Social History  Substance Use Topics  . Smoking status: Passive Smoke Exposure - Never Smoker    Types: Cigars  . Smokeless tobacco: Former Systems developer  . Alcohol use Yes     Comment: attended AA 1976     Allergies   Hydrocodone and Penicillins   Review of Systems Review of Systems  Constitutional: Negative for chills and fever.  Respiratory: Negative for shortness of breath.   Cardiovascular: Negative for chest pain.  Gastrointestinal: Positive for abdominal pain. Negative for diarrhea, nausea and vomiting.  Genitourinary: Positive for difficulty urinating and dysuria. Negative for flank pain and penile pain.  All other systems reviewed and are negative.    Physical Exam Updated Vital Signs BP 108/61 (BP Location: Right Arm)   Pulse (!) 56   Temp 98.1 F (36.7 C) (Oral)   Resp 16   SpO2 100%   Physical Exam  Constitutional: He is oriented to person, place, and time. He appears well-developed and well-nourished. No distress.  Pleasant, calm, cooperative  HENT:  Head: Normocephalic and atraumatic.  Eyes: Conjunctivae are normal. Pupils are equal, round, and reactive to light. Right eye exhibits no discharge. Left eye exhibits no discharge. No scleral icterus.  Neck: Normal range of motion.  Cardiovascular: Normal rate and regular rhythm.  Exam reveals no gallop and no friction rub.   No murmur heard. Pulmonary/Chest: Effort normal and breath sounds normal. No  respiratory distress. He has no wheezes. He has no rales. He exhibits no tenderness.  Abdominal: Soft. Bowel sounds are normal. He exhibits no distension and no mass. There is tenderness (Suprapubic tenderness). There is no rebound and no guarding. No hernia.  Musculoskeletal:  Left upper extremity brachiocephalic fistula with palpable thrill  Neurological: He is alert and oriented to person, place, and time.  Skin: Skin is warm and dry.  Psychiatric: He has a normal mood and affect. His behavior is normal.  Nursing note and vitals reviewed.    ED Treatments / Results  Labs (all labs ordered are listed, but only abnormal results are displayed) Labs Reviewed  CBC WITH DIFFERENTIAL/PLATELET - Abnormal; Notable for the following:       Result Value   RBC 3.93 (*)    Hemoglobin 12.0 (*)    HCT 37.7 (*)    RDW 16.1 (*)    Platelets 137 (*)    All other components within normal limits  COMPREHENSIVE METABOLIC PANEL - Abnormal; Notable for the following:    Chloride 98 (*)    Glucose, Bld 103 (*)    BUN 43 (*)    Creatinine, Ser 9.54 (*)    Calcium 8.7 (*)    ALT 11 (*)    GFR calc non Af Amer 4 (*)    GFR calc Af Amer 5 (*)    All other components within normal limits  URINALYSIS, ROUTINE W REFLEX MICROSCOPIC - Abnormal; Notable for the following:    APPearance HAZY (*)    Protein, ur 100 (*)    Leukocytes, UA TRACE (*)    Bacteria, UA RARE (*)    Squamous Epithelial / LPF 0-5 (*)    All other components within normal limits  URINE CULTURE    EKG  EKG Interpretation None       Radiology No results found.  Procedures Procedures (including critical care time)  Medications Ordered in ED Medications - No data to display   Initial Impression / Assessment and Plan / ED Course  I have reviewed the triage vital signs and the nursing notes.  Pertinent labs & imaging results that were available during my care of the patient were reviewed by me and considered in my  medical decision making (see chart for details).  83 year old with urinary retention. Vitals are normal. CBC remarkable for mild anemia. CMP remarkable for SCr of 9.54 with no recent value to compare. UA shows rare bacteria, hyaline casts, trace leukocytes, and TNTC WBC. Unclear if this an infection or not. Will hold on antibiotics at this point and send culture. Bladder scan revealed ~300cc of urine. Foley catheter was ordered which was placed by nursing staff. After foley was place pt reported significant improvement in suprapubic pain. Will have him follow up with Dr. Jeffie Pollock next week. Shared visit with Dr. Tomi Bamberger. Return precautions given.  Final Clinical Impressions(s) / ED Diagnoses   Final diagnoses:  Urinary retention    New Prescriptions New Prescriptions   No medications on file     Recardo Evangelist, PA-C 10/30/16 [place], MD 11/02/16 1210

## 2016-10-29 NOTE — Discharge Instructions (Signed)
Please keep foley clean Follow up with Dr. Jeffie Pollock on Thursday 4/12 at [facility]

## 2016-10-29 NOTE — ED Notes (Signed)
PT DISCHARGED. INSTRUCTIONS GIVEN. AAOX4. PT IN NO APPARENT DISTRESS OR PAIN. THE OPPORTUNITY TO ASK QUESTIONS WAS PROVIDED. 

## 2016-10-30 LAB — URINE CULTURE: Culture: NO GROWTH

## 2016-11-16 ENCOUNTER — Encounter (HOSPITAL_COMMUNITY): Payer: Self-pay | Admitting: *Deleted

## 2016-11-16 DIAGNOSIS — N184 Chronic kidney disease, stage 4 (severe): Secondary | ICD-10-CM | POA: Insufficient documentation

## 2016-11-16 DIAGNOSIS — Z7901 Long term (current) use of anticoagulants: Secondary | ICD-10-CM | POA: Diagnosis not present

## 2016-11-16 DIAGNOSIS — Z79899 Other long term (current) drug therapy: Secondary | ICD-10-CM | POA: Insufficient documentation

## 2016-11-16 DIAGNOSIS — Z7722 Contact with and (suspected) exposure to environmental tobacco smoke (acute) (chronic): Secondary | ICD-10-CM | POA: Diagnosis not present

## 2016-11-16 DIAGNOSIS — N3000 Acute cystitis without hematuria: Secondary | ICD-10-CM | POA: Insufficient documentation

## 2016-11-16 DIAGNOSIS — Z96653 Presence of artificial knee joint, bilateral: Secondary | ICD-10-CM | POA: Insufficient documentation

## 2016-11-16 DIAGNOSIS — R350 Frequency of micturition: Secondary | ICD-10-CM | POA: Insufficient documentation

## 2016-11-16 DIAGNOSIS — I129 Hypertensive chronic kidney disease with stage 1 through stage 4 chronic kidney disease, or unspecified chronic kidney disease: Secondary | ICD-10-CM | POA: Insufficient documentation

## 2016-11-16 DIAGNOSIS — Z8546 Personal history of malignant neoplasm of prostate: Secondary | ICD-10-CM | POA: Insufficient documentation

## 2016-11-16 DIAGNOSIS — Z5321 Procedure and treatment not carried out due to patient leaving prior to being seen by health care provider: Secondary | ICD-10-CM

## 2016-11-16 DIAGNOSIS — R339 Retention of urine, unspecified: Secondary | ICD-10-CM | POA: Diagnosis present

## 2016-11-16 NOTE — ED Notes (Signed)
Pt noticed some whitish discoloration in his urine on Saturday 21st, and has mild dysuria.

## 2016-11-16 NOTE — ED Notes (Signed)
Pt was wheeled to the restroom and made an unsuccessful attempt to provide urine specimen.

## 2016-11-16 NOTE — ED Triage Notes (Signed)
Pt states he has noticed a whitish color in his urine and has had urinary frequency. Pt is a dialysis pt but does urinate.

## 2016-11-17 ENCOUNTER — Encounter (HOSPITAL_COMMUNITY): Payer: Self-pay | Admitting: Emergency Medicine

## 2016-11-17 ENCOUNTER — Emergency Department (HOSPITAL_COMMUNITY)
Admission: EM | Admit: 2016-11-17 | Discharge: 2016-11-17 | Disposition: A | Discharge status: Home / Self Care | Payer: Medicare Other | Attending: Emergency Medicine | Admitting: Emergency Medicine

## 2016-11-17 ENCOUNTER — Emergency Department (HOSPITAL_COMMUNITY)
Admission: EM | Admit: 2016-11-17 | Discharge: 2016-11-17 | Disposition: A | Discharge status: Home / Self Care | Payer: Medicare Other | Source: Home / Self Care

## 2016-11-17 DIAGNOSIS — N3000 Acute cystitis without hematuria: Secondary | ICD-10-CM

## 2016-11-17 DIAGNOSIS — R339 Retention of urine, unspecified: Secondary | ICD-10-CM

## 2016-11-17 LAB — URINALYSIS, ROUTINE W REFLEX MICROSCOPIC
Bilirubin Urine: NEGATIVE
GLUCOSE, UA: NEGATIVE mg/dL
KETONES UR: NEGATIVE mg/dL
Nitrite: NEGATIVE
PH: 7 (ref 5.0–8.0)
Protein, ur: >=300 mg/dL — AB
SPECIFIC GRAVITY, URINE: 1.012 (ref 1.005–1.030)
SQUAMOUS EPITHELIAL / LPF: NONE SEEN

## 2016-11-17 MED ORDER — LIDOCAINE HCL 1 % IJ SOLN
INTRAMUSCULAR | Status: AC
  Filled 2016-11-17: qty 20

## 2016-11-17 MED ORDER — CEFTRIAXONE SODIUM 1 G IJ SOLR
1.0000 g | Freq: Once | INTRAMUSCULAR | Status: AC
  Administered 2016-11-17: 1 g via INTRAMUSCULAR
  Filled 2016-11-17: qty 10

## 2016-11-17 MED ORDER — CEPHALEXIN 500 MG PO CAPS: 500.0000 mg | ORAL_CAPSULE | Freq: Four times a day (QID) | ORAL | 0 refills | Status: DC

## 2016-11-17 NOTE — ED Triage Notes (Signed)
Patient reports dysuria x1.5 weeks. Patient recently had urinary catheter for 10 days due to urinary retention (unable to recall when catheter was in place). Pt is on dialysis. Pt has dialysis MWF.

## 2016-11-17 NOTE — ED Notes (Signed)
Pt caregiver up to triage doors stating that she just would like to take him to the doctor in the morning d/t current waiting. Encouraged evaluation here by EDP, discussed return at any time if decide wants eval and to see PCP if they do decide to leave.

## 2016-11-17 NOTE — ED Provider Notes (Signed)
[facility] DEPT Provider Note   CSN: [id number] Arrival date & time: 11/17/16  3428     History   Chief Complaint Chief Complaint  Patient presents with  . Dysuria    HPI Steven [name] is a 83 y.o. male.  Pt presents to the ED today with urinary retention and possible UTI.  The pt said that he had a similar problem a few weeks ago which required foley catheter placement.  Pt is on dialysis (MWF), so he does not urinate often, but he's had pus come out of his penis when he tries to urinate.  The pt was here on 4/4 and had the foley for 1 week.  He saw his urologist who removed it and started him on flomax.  Pt has been taking this medication.  He was here last night, but LWBS as the wait was long.  Today is Monday, and he did not go to dialysis this morning.      Past Medical History:  Diagnosis Date  . Anemia   . Atrial fibrillation ([place])   . BPH (benign prostatic hyperplasia)   . CKD (chronic kidney disease), stage IV ([place])   . Gout   . HX: long term anticoagulant use   . Hypercalcemia   . Hypertension   . Kidney stone   . Mitral valve disorders(424.0)   . Osteoarthritis   . Prostate CA ([place])   . PVC (premature ventricular contraction)   . Vitamin D deficiency     Patient Active Problem List   Diagnosis Date Noted  . Chronic kidney disease, stage IV (severe) ([place])   . Long-term (current) use of anticoagulants   . Hyperlipidemia   . Prostate cancer ([place])   . Acute blood loss anemia 06/08/2013  . Obesity (BMI 30-39.9)   . Atrial fibrillation ([place])   . Hypertensive heart disease   . Mitral valve disorders(424.0)   . S/P right TKA 06/06/2013    Past Surgical History:  Procedure Laterality Date  . EYE SURGERY Bilateral    cataract extraction with IOL  . JOINT REPLACEMENT Left 2012   knee  . KIDNEY STONE SURGERY    . SP AV DIALYSIS SHUNT ACCESS EXISTING *L* Left    no dialysis  . TOTAL KNEE ARTHROPLASTY Right 06/06/2013   Procedure: RIGHT TOTAL KNEE  ARTHROPLASTY;  Surgeon: Mauri Pole, MD;  Location: [facility];  Service: Orthopedics;  Laterality: Right;       Home Medications    Prior to Admission medications   Medication Sig Start Date End Date Taking? Authorizing Provider  acetaminophen (TYLENOL) 500 MG tablet Take 1,000 mg by mouth every 6 (six) hours as needed for mild pain, moderate pain, fever or headache.     Historical Provider, MD  allopurinol (ZYLOPRIM) 100 MG tablet Take 100 mg by mouth daily.     Historical Provider, MD  amiodarone (PACERONE) 200 MG tablet Take 200 mg by mouth daily.    Historical Provider, MD  atorvastatin (LIPITOR) 10 MG tablet Take 10 mg by mouth at bedtime.     Historical Provider, MD  cephALEXin (KEFLEX) 500 MG capsule Take 1 capsule (500 mg total) by mouth 4 (four) times daily. Take dose after dialysis on dialysis days. 11/17/16   Isla Pence, MD  cinacalcet (SENSIPAR) 30 MG tablet Take 30 mg by mouth at bedtime.    Historical Provider, MD  diltiazem (CARDIZEM CD) 240 MG 24 hr capsule Take 1 capsule (240 mg total) by mouth daily. 06/14/13  [name], PA-C  furosemide (LASIX) 40 MG tablet Take 40 mg by mouth 3 (three) times a week. Pt takes on Tuesday, Thursday, and Saturday.    Historical Provider, MD  levothyroxine (SYNTHROID, LEVOTHROID) 50 MCG tablet Take 50 mcg by mouth daily before breakfast.     Historical Provider, MD  medroxyPROGESTERone (PROVERA) 5 MG tablet Take 5 mg by mouth 2 (two) times daily.     Historical Provider, MD  metoprolol succinate (TOPROL-XL) 25 MG 24 hr tablet Take 25 mg by mouth at bedtime.     Historical Provider, MD  mirtazapine (REMERON) 30 MG tablet Take 30 mg by mouth at bedtime.    Historical Provider, MD  multivitamin (RENA-VIT) TABS tablet Take 1 tablet by mouth every evening.    Historical Provider, MD  sevelamer carbonate (RENVELA) 800 MG tablet Take 800 mg by mouth every evening.    Historical Provider, MD  vitamin B-12 (CYANOCOBALAMIN) 1000 MCG tablet Take  1,000 mcg by mouth every evening.    Historical Provider, MD  warfarin (COUMADIN) 3 MG tablet Take 3 mg by mouth every evening.    Historical Provider, MD    Family History No family history on file.  Social History Social History  Substance Use Topics  . Smoking status: Passive Smoke Exposure - Never Smoker    Types: Cigars  . Smokeless tobacco: Former Systems developer  . Alcohol use Yes     Comment: attended AA 1976     Allergies   Hydrocodone and Penicillins   Review of Systems Review of Systems  Genitourinary: Positive for dysuria.  All other systems reviewed and are negative.    Physical Exam Updated Vital Signs BP (!) 124/59 (BP Location: Right Arm)   Pulse 71   Temp 98 F (36.7 C) (Oral)   Resp 18   Ht 5\' 11"  (1.803 m)   Wt 215 lb (97.5 kg)   SpO2 99%   BMI 29.99 kg/m   Physical Exam  Constitutional: He is oriented to person, place, and time. He appears well-developed and well-nourished.  HENT:  Head: Normocephalic and atraumatic.  Right Ear: External ear normal.  Left Ear: External ear normal.  Nose: Nose normal.  Mouth/Throat: Oropharynx is clear and moist.  Eyes: Conjunctivae and EOM are normal. Pupils are equal, round, and reactive to light.  Neck: Normal range of motion. Neck supple.  Cardiovascular: Normal rate, regular rhythm, normal heart sounds and intact distal pulses.   Pulmonary/Chest: Effort normal and breath sounds normal.  Abdominal: Soft. Bowel sounds are normal.  Musculoskeletal: Normal range of motion.  Neurological: He is alert and oriented to person, place, and time.  Skin: Skin is warm.  Psychiatric: He has a normal mood and affect. His behavior is normal. Judgment and thought content normal.  Nursing note and vitals reviewed.    ED Treatments / Results  Labs (all labs ordered are listed, but only abnormal results are displayed) Labs Reviewed  URINALYSIS, ROUTINE W REFLEX MICROSCOPIC - Abnormal; Notable for the following:        Result Value   APPearance TURBID (*)    Hgb urine dipstick SMALL (*)    Protein, ur >=300 (*)    Leukocytes, UA LARGE (*)    Bacteria, UA MANY (*)    All other components within normal limits  URINE CULTURE    EKG  EKG Interpretation None       Radiology No results found.  Procedures Procedures (including critical care time)  Medications Ordered in  ED Medications  cefTRIAXone (ROCEPHIN) injection 1 g (not administered)     Initial Impression / Assessment and Plan / ED Course  I have reviewed the triage vital signs and the nursing notes.  Pertinent labs & imaging results that were available during my care of the patient were reviewed by me and considered in my medical decision making (see chart for details).    Frank pus in foley cath.  Pt feels much better after foley insertion.  Pt started on rocephin and keflex.  He is instructed to call his urologist and make an appt.  Return if worse.  Final Clinical Impressions(s) / ED Diagnoses   Final diagnoses:  Acute cystitis without hematuria  Urinary retention    New Prescriptions New Prescriptions   CEPHALEXIN (KEFLEX) 500 MG CAPSULE    Take 1 capsule (500 mg total) by mouth 4 (four) times daily. Take dose after dialysis on dialysis days.     Isla Pence, MD 11/17/16 1200

## 2016-11-19 LAB — URINE CULTURE: Culture: 80000 — AB

## 2016-11-20 ENCOUNTER — Telehealth: Payer: Self-pay | Admitting: *Deleted

## 2016-11-20 NOTE — Progress Notes (Signed)
ED Antimicrobial Stewardship Positive Culture Follow Up   Steven [name] is an 83 y.o. male who presented to [facility] on 11/17/2016 with a chief complaint of  Chief Complaint  Patient presents with  . Dysuria    Recent Results (from the past 720 hour(s))  Urine culture     Status: None   Collection Time: 10/29/16 11:07 AM  Result Value Ref Range Status   Specimen Description URINE, RANDOM  Final   Special Requests NONE  Final   Culture   Final    NO GROWTH Performed at [facility], [address] [place], [place] [address]    Report Status 10/30/2016 FINAL  Final  Urine culture     Status: Abnormal   Collection Time: 11/17/16 10:50 AM  Result Value Ref Range Status   Specimen Description URINE, CATHETERIZED  Final   Special Requests NONE  Final   Culture (A)  Final    80,000 COLONIES/mL METHICILLIN RESISTANT STAPHYLOCOCCUS AUREUS   Report Status 11/19/2016 FINAL  Final   Organism ID, Bacteria METHICILLIN RESISTANT STAPHYLOCOCCUS AUREUS (A)  Final      Susceptibility   Methicillin resistant staphylococcus aureus - MIC*    CIPROFLOXACIN >=8 RESISTANT Resistant     GENTAMICIN <=0.5 SENSITIVE Sensitive     NITROFURANTOIN <=16 SENSITIVE Sensitive     OXACILLIN RESISTANT Resistant     TETRACYCLINE <=1 SENSITIVE Sensitive     VANCOMYCIN 1 SENSITIVE Sensitive     TRIMETH/SULFA <=10 SENSITIVE Sensitive     CLINDAMYCIN <=0.25 SENSITIVE Sensitive     RIFAMPIN <=0.5 SENSITIVE Sensitive     Inducible Clindamycin NEGATIVE Sensitive     * 80,000 COLONIES/mL METHICILLIN RESISTANT STAPHYLOCOCCUS AUREUS    [x]  Treated with cephalexin, organism resistant to prescribed antimicrobial   Patient needs to follow up with urologist or return to the emergency room for further evaluation.  Reluctant to prescribe Bactrim for him given that he is on warfarin and has ESRD.  ED Provider: Arlean Hopping, PA-C   Marylou Flesher, PharmD Candidate 11/20/2016, 8:35 AM  Heide Guile, PharmD, BCPS-AQ  ID Clinical Pharmacist Pager [phone]

## 2016-11-20 NOTE — Telephone Encounter (Signed)
Post ED Visit - Positive Culture Follow-up: Successful Patient Follow-Up  Culture assessed and recommendations reviewed by: []  Elenor Quinones, Pharm.D. []  Heide Guile, Pharm.D., BCPS AQ-ID []  Parks Neptune, Pharm.D., BCPS []  Alycia Rossetti, Pharm.D., BCPS []  [place], Pharm.D., BCPS, AAHIVP []  Legrand Como, Pharm.D., BCPS, AAHIVP []  Salome Arnt, PharmD, BCPS []  Dimitri Ped, PharmD, BCPS []  Vincenza Hews, PharmD, BCPS  Positive urine culture  []  Patient discharged without antimicrobial prescription and treatment is now indicated [x]  Organism is resistant to prescribed ED discharge antimicrobial []  Patient with positive blood cultures  Changes discussed with ED provider Arlean Hopping, PA-C who recommends F/U with urologist due to currently on Coumadin.  Spoke with Sharyn Lull at Dr. Ralene Muskrat office who states patient seen in the office today.  Sharyn Lull will F/U regarding culture and treatment with NP who saw paitent today.  Contacted patient, date 11/20/2016, time [place], [place] 11/20/2016, 11:33 AM

## 2016-11-25 ENCOUNTER — Telehealth: Payer: Self-pay | Admitting: Oncology

## 2016-11-25 NOTE — Telephone Encounter (Signed)
caregiver called to cxl 5/3 appt due to pt being sick. Will call back to r/s once better

## 2016-11-27 ENCOUNTER — Ambulatory Visit: Payer: Medicare Other | Admitting: Oncology

## 2016-12-08 ENCOUNTER — Encounter (HOSPITAL_COMMUNITY): Payer: Self-pay

## 2016-12-08 ENCOUNTER — Inpatient Hospital Stay (HOSPITAL_COMMUNITY)
Admission: EM | Admit: 2016-12-08 | Discharge: 2016-12-13 | DRG: 291 | Disposition: A | Discharge status: Home Health | Payer: Medicare Other | Attending: Internal Medicine | Admitting: Internal Medicine

## 2016-12-08 ENCOUNTER — Emergency Department (HOSPITAL_COMMUNITY): Payer: Medicare Other

## 2016-12-08 DIAGNOSIS — F329 Major depressive disorder, single episode, unspecified: Secondary | ICD-10-CM | POA: Diagnosis present

## 2016-12-08 DIAGNOSIS — I481 Persistent atrial fibrillation: Secondary | ICD-10-CM | POA: Diagnosis not present

## 2016-12-08 DIAGNOSIS — I700 Atherosclerosis of aorta: Secondary | ICD-10-CM | POA: Diagnosis present

## 2016-12-08 DIAGNOSIS — Z992 Dependence on renal dialysis: Secondary | ICD-10-CM | POA: Diagnosis not present

## 2016-12-08 DIAGNOSIS — I482 Chronic atrial fibrillation: Secondary | ICD-10-CM | POA: Diagnosis present

## 2016-12-08 DIAGNOSIS — E039 Hypothyroidism, unspecified: Secondary | ICD-10-CM | POA: Diagnosis present

## 2016-12-08 DIAGNOSIS — R531 Weakness: Secondary | ICD-10-CM | POA: Diagnosis not present

## 2016-12-08 DIAGNOSIS — E538 Deficiency of other specified B group vitamins: Secondary | ICD-10-CM | POA: Diagnosis present

## 2016-12-08 DIAGNOSIS — N400 Enlarged prostate without lower urinary tract symptoms: Secondary | ICD-10-CM | POA: Diagnosis present

## 2016-12-08 DIAGNOSIS — E785 Hyperlipidemia, unspecified: Secondary | ICD-10-CM | POA: Diagnosis present

## 2016-12-08 DIAGNOSIS — E782 Mixed hyperlipidemia: Secondary | ICD-10-CM | POA: Diagnosis not present

## 2016-12-08 DIAGNOSIS — I132 Hypertensive heart and chronic kidney disease with heart failure and with stage 5 chronic kidney disease, or end stage renal disease: Secondary | ICD-10-CM | POA: Diagnosis present

## 2016-12-08 DIAGNOSIS — Z7901 Long term (current) use of anticoagulants: Secondary | ICD-10-CM

## 2016-12-08 DIAGNOSIS — Z8546 Personal history of malignant neoplasm of prostate: Secondary | ICD-10-CM

## 2016-12-08 DIAGNOSIS — Z6835 Body mass index (BMI) 35.0-35.9, adult: Secondary | ICD-10-CM

## 2016-12-08 DIAGNOSIS — N186 End stage renal disease: Secondary | ICD-10-CM

## 2016-12-08 DIAGNOSIS — M109 Gout, unspecified: Secondary | ICD-10-CM | POA: Diagnosis present

## 2016-12-08 DIAGNOSIS — D631 Anemia in chronic kidney disease: Secondary | ICD-10-CM | POA: Diagnosis present

## 2016-12-08 DIAGNOSIS — J9601 Acute respiratory failure with hypoxia: Secondary | ICD-10-CM | POA: Diagnosis not present

## 2016-12-08 DIAGNOSIS — I4891 Unspecified atrial fibrillation: Secondary | ICD-10-CM | POA: Diagnosis not present

## 2016-12-08 DIAGNOSIS — R0602 Shortness of breath: Secondary | ICD-10-CM | POA: Diagnosis present

## 2016-12-08 DIAGNOSIS — I119 Hypertensive heart disease without heart failure: Secondary | ICD-10-CM | POA: Diagnosis present

## 2016-12-08 DIAGNOSIS — E784 Other hyperlipidemia: Secondary | ICD-10-CM | POA: Diagnosis not present

## 2016-12-08 DIAGNOSIS — Z79899 Other long term (current) drug therapy: Secondary | ICD-10-CM

## 2016-12-08 DIAGNOSIS — Z96653 Presence of artificial knee joint, bilateral: Secondary | ICD-10-CM | POA: Diagnosis present

## 2016-12-08 DIAGNOSIS — J9811 Atelectasis: Secondary | ICD-10-CM | POA: Diagnosis present

## 2016-12-08 DIAGNOSIS — E669 Obesity, unspecified: Secondary | ICD-10-CM | POA: Diagnosis not present

## 2016-12-08 DIAGNOSIS — Z9841 Cataract extraction status, right eye: Secondary | ICD-10-CM

## 2016-12-08 DIAGNOSIS — Z961 Presence of intraocular lens: Secondary | ICD-10-CM | POA: Diagnosis present

## 2016-12-08 DIAGNOSIS — I10 Essential (primary) hypertension: Secondary | ICD-10-CM | POA: Diagnosis not present

## 2016-12-08 DIAGNOSIS — R0902 Hypoxemia: Secondary | ICD-10-CM | POA: Diagnosis not present

## 2016-12-08 DIAGNOSIS — I351 Nonrheumatic aortic (valve) insufficiency: Secondary | ICD-10-CM | POA: Diagnosis not present

## 2016-12-08 DIAGNOSIS — I340 Nonrheumatic mitral (valve) insufficiency: Secondary | ICD-10-CM | POA: Diagnosis present

## 2016-12-08 DIAGNOSIS — N2581 Secondary hyperparathyroidism of renal origin: Secondary | ICD-10-CM | POA: Diagnosis present

## 2016-12-08 DIAGNOSIS — R791 Abnormal coagulation profile: Secondary | ICD-10-CM | POA: Diagnosis present

## 2016-12-08 DIAGNOSIS — Z9842 Cataract extraction status, left eye: Secondary | ICD-10-CM

## 2016-12-08 DIAGNOSIS — I5033 Acute on chronic diastolic (congestive) heart failure: Secondary | ICD-10-CM | POA: Diagnosis present

## 2016-12-08 DIAGNOSIS — Z880 Allergy status to penicillin: Secondary | ICD-10-CM | POA: Diagnosis not present

## 2016-12-08 DIAGNOSIS — I4510 Unspecified right bundle-branch block: Secondary | ICD-10-CM | POA: Diagnosis present

## 2016-12-08 DIAGNOSIS — Z9115 Patient's noncompliance with renal dialysis: Secondary | ICD-10-CM

## 2016-12-08 DIAGNOSIS — I059 Rheumatic mitral valve disease, unspecified: Secondary | ICD-10-CM | POA: Diagnosis present

## 2016-12-08 DIAGNOSIS — R339 Retention of urine, unspecified: Secondary | ICD-10-CM | POA: Diagnosis not present

## 2016-12-08 LAB — BASIC METABOLIC PANEL WITH GFR
Anion gap: 12 (ref 5–15)
BUN: 26 mg/dL — ABNORMAL HIGH (ref 6–20)
CO2: 26 mmol/L (ref 22–32)
Calcium: 8.2 mg/dL — ABNORMAL LOW (ref 8.9–10.3)
Chloride: 103 mmol/L (ref 101–111)
Creatinine, Ser: 9.08 mg/dL — ABNORMAL HIGH (ref 0.61–1.24)
GFR calc Af Amer: 5 mL/min — ABNORMAL LOW (ref >60)
GFR calc non Af Amer: 5 mL/min — ABNORMAL LOW (ref >60)
Glucose, Bld: 106 mg/dL — ABNORMAL HIGH (ref 65–99)
Potassium: 4.4 mmol/L (ref 3.5–5.1)
Sodium: 141 mmol/L (ref 135–145)

## 2016-12-08 LAB — I-STAT CHEM 8, ED
BUN: 30 mg/dL — ABNORMAL HIGH (ref 6–20)
Calcium, Ion: 0.96 mmol/L — ABNORMAL LOW (ref 1.15–1.40)
Chloride: 102 mmol/L (ref 101–111)
Creatinine, Ser: 9.3 mg/dL — ABNORMAL HIGH (ref 0.61–1.24)
Glucose, Bld: 99 mg/dL (ref 65–99)
HCT: 35 % — ABNORMAL LOW (ref 39.0–52.0)
HEMOGLOBIN: 11.9 g/dL — AB (ref 13.0–17.0)
POTASSIUM: 4.4 mmol/L (ref 3.5–5.1)
SODIUM: 140 mmol/L (ref 135–145)
TCO2: 28 mmol/L (ref 0–100)

## 2016-12-08 LAB — COMPREHENSIVE METABOLIC PANEL
ALK PHOS: 112 U/L (ref 38–126)
ALT: 9 U/L — ABNORMAL LOW (ref 17–63)
ANION GAP: 12 (ref 5–15)
AST: 20 U/L (ref 15–41)
Albumin: 3 g/dL — ABNORMAL LOW (ref 3.5–5.0)
BILIRUBIN TOTAL: 0.6 mg/dL (ref 0.3–1.2)
BUN: 29 mg/dL — ABNORMAL HIGH (ref 6–20)
CALCIUM: 8.3 mg/dL — AB (ref 8.9–10.3)
CO2: 25 mmol/L (ref 22–32)
CREATININE: 9.51 mg/dL — AB (ref 0.61–1.24)
Chloride: 102 mmol/L (ref 101–111)
GFR calc Af Amer: 5 mL/min — ABNORMAL LOW (ref >60)
GFR calc non Af Amer: 4 mL/min — ABNORMAL LOW (ref >60)
Glucose, Bld: 130 mg/dL — ABNORMAL HIGH (ref 65–99)
Potassium: 4.1 mmol/L (ref 3.5–5.1)
SODIUM: 139 mmol/L (ref 135–145)
Total Protein: 5.9 g/dL — ABNORMAL LOW (ref 6.5–8.1)

## 2016-12-08 LAB — MRSA PCR SCREENING: MRSA by PCR: POSITIVE — AB

## 2016-12-08 LAB — CBC
HCT: 37.1 % — ABNORMAL LOW (ref 39.0–52.0)
Hemoglobin: 11.3 g/dL — ABNORMAL LOW (ref 13.0–17.0)
MCH: 29.2 pg (ref 26.0–34.0)
MCHC: 30.5 g/dL (ref 30.0–36.0)
MCV: 95.9 fL (ref 78.0–100.0)
Platelets: 156 10*3/uL (ref 150–400)
RBC: 3.87 MIL/uL — AB (ref 4.22–5.81)
RDW: 17.5 % — AB (ref 11.5–15.5)
WBC: 6 10*3/uL (ref 4.0–10.5)

## 2016-12-08 LAB — PROTIME-INR
INR: 1.88
PROTHROMBIN TIME: 21.9 s — AB (ref 11.4–15.2)

## 2016-12-08 LAB — TSH: TSH: 4.285 u[IU]/mL (ref 0.350–4.500)

## 2016-12-08 LAB — BRAIN NATRIURETIC PEPTIDE: B NATRIURETIC PEPTIDE 5: 777.6 pg/mL — AB (ref 0.0–100.0)

## 2016-12-08 MED ORDER — DILTIAZEM LOAD VIA INFUSION
10.0000 mg | Freq: Once | INTRAVENOUS | Status: AC
  Administered 2016-12-08: 10 mg via INTRAVENOUS
  Filled 2016-12-08: qty 10

## 2016-12-08 MED ORDER — AMIODARONE HCL 200 MG PO TABS
200.0000 mg | ORAL_TABLET | Freq: Every day | ORAL | Status: DC
  Administered 2016-12-09 – 2016-12-13 (×4): 200 mg via ORAL
  Filled 2016-12-08 (×4): qty 1

## 2016-12-08 MED ORDER — ATORVASTATIN CALCIUM 10 MG PO TABS
10.0000 mg | ORAL_TABLET | Freq: Every day | ORAL | Status: DC
  Administered 2016-12-08 – 2016-12-12 (×5): 10 mg via ORAL
  Filled 2016-12-08 (×6): qty 1

## 2016-12-08 MED ORDER — MIRTAZAPINE 15 MG PO TABS
30.0000 mg | ORAL_TABLET | Freq: Every day | ORAL | Status: DC
  Administered 2016-12-08 – 2016-12-12 (×5): 30 mg via ORAL
  Filled 2016-12-08: qty 1
  Filled 2016-12-08: qty 2
  Filled 2016-12-08 (×3): qty 1
  Filled 2016-12-08: qty 2

## 2016-12-08 MED ORDER — SENNOSIDES-DOCUSATE SODIUM 8.6-50 MG PO TABS: 1.0000 | ORAL_TABLET | Freq: Every evening | ORAL | Status: DC | PRN

## 2016-12-08 MED ORDER — MUPIROCIN 2 % EX OINT
1.0000 "application " | TOPICAL_OINTMENT | Freq: Two times a day (BID) | CUTANEOUS | Status: DC
  Administered 2016-12-08 – 2016-12-13 (×9): 1 via NASAL
  Filled 2016-12-08 (×2): qty 22

## 2016-12-08 MED ORDER — METOPROLOL SUCCINATE ER 25 MG PO TB24
25.0000 mg | ORAL_TABLET | Freq: Every day | ORAL | Status: DC
  Administered 2016-12-09 – 2016-12-12 (×4): 25 mg via ORAL
  Filled 2016-12-08 (×5): qty 1

## 2016-12-08 MED ORDER — SODIUM CHLORIDE 0.9 % IV SOLN
INTRAVENOUS | Status: DC
  Administered 2016-12-08: 10 mL/h via INTRAVENOUS
  Administered 2016-12-11: 10:00:00 via INTRAVENOUS

## 2016-12-08 MED ORDER — ACETAMINOPHEN 500 MG PO TABS
1000.0000 mg | ORAL_TABLET | Freq: Four times a day (QID) | ORAL | Status: DC | PRN
  Administered 2016-12-11 – 2016-12-12 (×2): 1000 mg via ORAL
  Filled 2016-12-08 (×2): qty 2

## 2016-12-08 MED ORDER — WARFARIN SODIUM 3 MG PO TABS: 3.0000 mg | ORAL_TABLET | Freq: Every evening | ORAL | Status: DC

## 2016-12-08 MED ORDER — WARFARIN - PHARMACIST DOSING INPATIENT
Freq: Every day | Status: DC
  Administered 2016-12-12: 18:00:00

## 2016-12-08 MED ORDER — VITAMIN B-12 1000 MCG PO TABS
1000.0000 ug | ORAL_TABLET | Freq: Every evening | ORAL | Status: DC
Start: 2016-12-08 — End: 2016-12-13
  Administered 2016-12-08 – 2016-12-12 (×6): 1000 ug via ORAL
  Filled 2016-12-08 (×6): qty 1

## 2016-12-08 MED ORDER — ALLOPURINOL 100 MG PO TABS
100.0000 mg | ORAL_TABLET | Freq: Every day | ORAL | Status: DC
  Administered 2016-12-08 – 2016-12-13 (×5): 100 mg via ORAL
  Filled 2016-12-08 (×5): qty 1

## 2016-12-08 MED ORDER — DILTIAZEM HCL ER COATED BEADS 240 MG PO CP24: 240.0000 mg | ORAL_CAPSULE | Freq: Every day | ORAL | Status: DC

## 2016-12-08 MED ORDER — RENA-VITE PO TABS
1.0000 | ORAL_TABLET | Freq: Every evening | ORAL | Status: DC
  Administered 2016-12-09 – 2016-12-12 (×4): 1 via ORAL
  Filled 2016-12-08 (×5): qty 1

## 2016-12-08 MED ORDER — LEVOTHYROXINE SODIUM 50 MCG PO TABS
50.0000 ug | ORAL_TABLET | Freq: Every day | ORAL | Status: DC
  Administered 2016-12-09 – 2016-12-13 (×5): 50 ug via ORAL
  Filled 2016-12-08 (×5): qty 1

## 2016-12-08 MED ORDER — WARFARIN SODIUM 2 MG PO TABS
1.0000 mg | ORAL_TABLET | Freq: Once | ORAL | Status: AC
  Administered 2016-12-08: 1 mg via ORAL
  Filled 2016-12-08: qty 0.5

## 2016-12-08 MED ORDER — ONDANSETRON HCL 4 MG/2ML IJ SOLN: 4.0000 mg | Freq: Four times a day (QID) | INTRAMUSCULAR | Status: DC | PRN

## 2016-12-08 MED ORDER — FUROSEMIDE 20 MG PO TABS: 40.0000 mg | ORAL_TABLET | ORAL | Status: DC

## 2016-12-08 MED ORDER — FUROSEMIDE 40 MG PO TABS
40.0000 mg | ORAL_TABLET | ORAL | Status: DC
  Filled 2016-12-08: qty 1

## 2016-12-08 MED ORDER — ONDANSETRON HCL 4 MG PO TABS: 4.0000 mg | ORAL_TABLET | Freq: Four times a day (QID) | ORAL | Status: DC | PRN

## 2016-12-08 MED ORDER — CHLORHEXIDINE GLUCONATE CLOTH 2 % EX PADS
6.0000 | MEDICATED_PAD | Freq: Every day | CUTANEOUS | Status: DC
  Administered 2016-12-09 – 2016-12-13 (×4): 6 via TOPICAL

## 2016-12-08 MED ORDER — WARFARIN SODIUM 2 MG PO TABS
2.0000 mg | ORAL_TABLET | Freq: Once | ORAL | Status: AC
  Administered 2016-12-08: 2 mg via ORAL
  Filled 2016-12-08: qty 1

## 2016-12-08 MED ORDER — DILTIAZEM HCL-DEXTROSE 100-5 MG/100ML-% IV SOLN (PREMIX)
5.0000 mg/h | INTRAVENOUS | Status: DC
  Administered 2016-12-08: 5 mg/h via INTRAVENOUS
  Administered 2016-12-08 – 2016-12-09 (×3): 15 mg/h via INTRAVENOUS
  Filled 2016-12-08 (×7): qty 100

## 2016-12-08 MED ORDER — SEVELAMER CARBONATE 800 MG PO TABS
800.0000 mg | ORAL_TABLET | Freq: Every day | ORAL | Status: DC
  Administered 2016-12-08 – 2016-12-12 (×5): 800 mg via ORAL
  Filled 2016-12-08 (×5): qty 1

## 2016-12-08 NOTE — Progress Notes (Addendum)
ANTICOAGULATION CONSULT NOTE - Initial Consult  Pharmacy Consult for Warfarin  Indication: atrial fibrillation  Allergies  Allergen Reactions  . Hydrocodone Other (See Comments)    Reaction:  Confusion   . Penicillins Hives and Other (See Comments)    Has patient had a PCN reaction causing immediate rash, facial/tongue/throat swelling, SOB or lightheadedness with hypotension: No Has patient had a PCN reaction causing severe rash involving mucus membranes or skin necrosis: No Has patient had a PCN reaction that required hospitalization No Has patient had a PCN reaction occurring within the last 10 years: No If all of the above answers are "NO", then may proceed with Cephalosporin use.    Patient Measurements: Height: 5\' 11"  (180.3 cm) Weight: 215 lb (97.5 kg) IBW/kg (Calculated) : 75.3  Vital Signs: Temp: 98.4 F (36.9 C) (05/14 1123) Temp Source: Oral (05/14 1123) BP: 109/75 (05/14 1700) Pulse Rate: 109 (05/14 1700)  Labs:  Recent Labs  12/08/16 1153 12/08/16 1209  HGB 11.3* 11.9*  HCT 37.1* 35.0*  PLT 156  --   LABPROT 21.9*  --   INR 1.88  --   CREATININE 9.08* 9.30*    Estimated Creatinine Clearance: 6.5 mL/min (A) (by C-G formula based on SCr of 9.3 mg/dL (H)).   Medical History: Past Medical History:  Diagnosis Date  . Anemia   . Atrial fibrillation ([place])   . BPH (benign prostatic hyperplasia)   . CKD (chronic kidney disease), stage IV ([place])   . Gout   . HX: long term anticoagulant use   . Hypercalcemia   . Hypertension   . Kidney stone   . Mitral valve disorders(424.0)   . Osteoarthritis   . Prostate CA ([place])   . PVC (premature ventricular contraction)   . Vitamin D deficiency     Assessment: 83 yo male admitted with acute respiratory failure, on warfarin PTA for afib. Per note, DVT/PE is a possibility, V/Q scan ordered. INR subtherapeutic upon admission at 1.88. PTA warfarin dose = 2 mg PO daily  -Hgb low, stable ~12, PLTC wnl, no bleeding  noted -No new DDIs noted since admission -Diet ordered   Goal of Therapy:  INR 2-3 Monitor platelets by anticoagulation protocol: Yes   Plan:  Warfarin 3 mg PO x1 tonight Follow up dopplers, V/Q scan F/u need for [facility] bridge to therapeutic INR Daily INR, CBC Monitor for s/sx bleeding, PO intake, new DDIs  Carlean Jews, Pharm.D. PGY1 Pharmacy Resident 5/14/20186:08 PM Pager [phone]

## 2016-12-08 NOTE — ED Notes (Signed)
Nephrology NP at bedside. 

## 2016-12-08 NOTE — Consult Note (Signed)
[facility] Renal Consultation Note    Indication for Consultation:  Management of ESRD/hemodialysis, anemia, hypertension/volume, and secondary hyperparathyroidism. PCP:  HPI: Steven [name] is a 83 y.o. male with ESRD (x ~3 years), Hx prostate Ca (with indwelling foley), HTN, A-fib (on warfarin) who is being admitted with Afib RVR.   Reports that he was noted to have high HR during HD last Wednesday and Friday, but was asymptomatic. Over the weekend developed weakness which progressed. Was too weak to go to HD this morning and counted his HR by watching his AVF and realized that it was still high so called EMS. In ED, noted to be in Afib RVR; treated with IV diltiazem.  Currently, denies CP or dyspnea. Labs ok (K 4.4, CO2 26, Hgb 11.9, WBC 6). CXR with bibasilar atelectasis. No fever, chills, N/V or diarrhea.  Past Medical History:  Diagnosis Date  . Anemia   . Atrial fibrillation ([place])   . BPH (benign prostatic hyperplasia)   . CKD (chronic kidney disease), stage IV ([place])   . Gout   . HX: long term anticoagulant use   . Hypercalcemia   . Hypertension   . Kidney stone   . Mitral valve disorders(424.0)   . Osteoarthritis   . Prostate CA ([place])   . PVC (premature ventricular contraction)   . Vitamin D deficiency    Past Surgical History:  Procedure Laterality Date  . EYE SURGERY Bilateral    cataract extraction with IOL  . JOINT REPLACEMENT Left 2012   knee  . KIDNEY STONE SURGERY    . SP AV DIALYSIS SHUNT ACCESS EXISTING *L* Left    no dialysis  . TOTAL KNEE ARTHROPLASTY Right 06/06/2013   Procedure: RIGHT TOTAL KNEE ARTHROPLASTY;  Surgeon: Mauri Pole, MD;  Location: [facility];  Service: Orthopedics;  Laterality: Right;   No family history on file. Social History:  reports that he is a non-smoker but has been exposed to tobacco smoke. He has quit using smokeless tobacco. He reports that he drinks alcohol. He reports that he does not use drugs.  ROS: As  per HPI otherwise negative.  Physical Exam: Vitals:   12/08/16 1345 12/08/16 1400 12/08/16 1415 12/08/16 1430  BP: 111/79 111/74 111/61 118/69  Pulse: 99 (!) 102 (!) 105 (!) 107  Resp: (!) 25 (!) 25 (!) 26 (!) 23  Temp:      TempSrc:      SpO2: 93% 95% 94% 97%  Weight:      Height:         General: Well developed, well nourished, in no acute distress. Head: Normocephalic, atraumatic. Neck: Supple without lymphadenopathy/masses. JVD not elevated. Lungs: Clear bilaterally to auscultation without wheezes, rales, or rhonchi. Breathing is unlabored. Heart: Tachycardic, but regular rhythm at this time. Abdomen: Soft, non-tender, non-distended with normoactive bowel sounds. Musculoskeletal:  Strength and tone appear normal for age. Lower extremities: No edema or ischemic changes, no open wounds. Neuro: Alert and oriented X 3. Moves all extremities spontaneously. Psych:  Responds to questions appropriately with a normal affect. Dialysis Access: LUE aneurysmal AVF + thrill  Allergies  Allergen Reactions  . Hydrocodone Other (See Comments)    Reaction:  Confusion   . Penicillins Hives and Other (See Comments)    Has patient had a PCN reaction causing immediate rash, facial/tongue/throat swelling, SOB or lightheadedness with hypotension: No Has patient had a PCN reaction causing severe rash involving mucus membranes or skin necrosis: No Has patient had a  PCN reaction that required hospitalization No Has patient had a PCN reaction occurring within the last 10 years: No If all of the above answers are "NO", then may proceed with Cephalosporin use.   Prior to Admission medications   Medication Sig Start Date End Date Taking? Authorizing Provider  acetaminophen (TYLENOL) 500 MG tablet Take 1,000 mg by mouth every 6 (six) hours as needed for mild pain, moderate pain, fever or headache.     [provider]  allopurinol (ZYLOPRIM) 100 MG tablet Take 100 mg by mouth daily.     [provider]  amiodarone (PACERONE) 200 MG tablet Take 200 mg by mouth daily.    [provider]  atorvastatin (LIPITOR) 10 MG tablet Take 10 mg by mouth at bedtime.     [provider]  cephALEXin (KEFLEX) 500 MG capsule Take 1 capsule (500 mg total) by mouth 4 (four) times daily. Take dose after dialysis on dialysis days. 11/17/16   Isla Pence, MD  cinacalcet (SENSIPAR) 30 MG tablet Take 30 mg by mouth at bedtime.    [provider]  diltiazem (CARDIZEM CD) 240 MG 24 hr capsule Take 1 capsule (240 mg total) by mouth daily. 06/14/13   [name], PA-C  furosemide (LASIX) 40 MG tablet Take 40 mg by mouth 3 (three) times a week. Pt takes on Tuesday, Thursday, and Saturday.    [provider]  levothyroxine (SYNTHROID, LEVOTHROID) 50 MCG tablet Take 50 mcg by mouth daily before breakfast.     [provider]  medroxyPROGESTERone (PROVERA) 5 MG tablet Take 5 mg by mouth 2 (two) times daily.     [provider]  metoprolol succinate (TOPROL-XL) 25 MG 24 hr tablet Take 25 mg by mouth at bedtime.     [provider]  mirtazapine (REMERON) 30 MG tablet Take 30 mg by mouth at bedtime.    [provider]  multivitamin (RENA-VIT) TABS tablet Take 1 tablet by mouth every evening.    [provider]  sevelamer carbonate (RENVELA) 800 MG tablet Take 800 mg by mouth every evening.    [provider]  vitamin B-12 (CYANOCOBALAMIN) 1000 MCG tablet Take 1,000 mcg by mouth every evening.    [provider]  warfarin (COUMADIN) 3 MG tablet Take 3 mg by mouth every evening.    [provider]   Current Facility-Administered Medications  Medication Dose Route Frequency Provider Last Rate Last Dose  . 0.9 %  sodium chloride infusion   Intravenous Continuous Lajean Saver, MD 10 mL/hr at 12/08/16 1152 10 mL/hr at 12/08/16 1152  . acetaminophen (TYLENOL) tablet 1,000 mg  1,000 mg Oral Q6H PRN Rondel Jumbo, PA-C      . allopurinol (ZYLOPRIM) tablet 100 mg  100 mg Oral Daily Wertman, Sara E, PA-C      . amiodarone (PACERONE) tablet 200 mg  200 mg Oral Daily Wertman, Sara E, PA-C      . atorvastatin (LIPITOR) tablet 10 mg  10 mg Oral QHS Rondel Jumbo, PA-C      . [START ON 12/09/2016] diltiazem (CARDIZEM CD) 24 hr capsule 240 mg  240 mg Oral Daily Wertman, Sara E, PA-C      . diltiazem (CARDIZEM) 100 mg in dextrose 5% 118mL (1 mg/mL) infusion  5-15 mg/hr Intravenous Continuous Lajean Saver, MD 15 mL/hr at 12/08/16 1330 15 mg/hr at 12/08/16 1330  . [START ON 12/09/2016] furosemide (LASIX) tablet 40 mg  40 mg Oral Once  per day on Mon Wed Fri [id number], Sara E, PA-C      . [START ON 12/09/2016] levothyroxine (SYNTHROID, LEVOTHROID) tablet 50 mcg  50 mcg Oral QAC breakfast Rondel Jumbo, PA-C      . metoprolol succinate (TOPROL-XL) 24 hr tablet 25 mg  25 mg Oral QHS Wertman, Sara E, PA-C      . mirtazapine (REMERON) tablet 30 mg  30 mg Oral QHS Wertman, Sara E, PA-C      . multivitamin (RENA-VIT) tablet 1 tablet  1 tablet Oral QPM Wertman, Sara E, PA-C      . ondansetron (ZOFRAN) tablet 4 mg  4 mg Oral Q6H PRN Rondel Jumbo, PA-C       Or  . ondansetron (ZOFRAN) injection 4 mg  4 mg Intravenous Q6H PRN Rondel Jumbo, PA-C      . senna-docusate (Senokot-S) tablet 1 tablet  1 tablet Oral QHS PRN Rondel Jumbo, PA-C      . sevelamer carbonate (RENVELA) tablet 800 mg  800 mg Oral QPM Wertman, Sara E, PA-C      . vitamin B-12 (CYANOCOBALAMIN) tablet 1,000 mcg  1,000 mcg Oral QPM Wertman, Sara E, PA-C      . warfarin (COUMADIN) tablet 3 mg  3 mg Oral QPM Rondel Jumbo, PA-C       Current Outpatient Prescriptions  Medication Sig Dispense Refill  . acetaminophen (TYLENOL) 500 MG tablet Take 1,000 mg by mouth every 6 (six) hours as needed for mild pain, moderate pain, fever or headache.     . allopurinol (ZYLOPRIM) 100 MG tablet Take 100 mg by mouth daily.     Marland Kitchen amiodarone (PACERONE) 200 MG  tablet Take 200 mg by mouth daily.    Marland Kitchen atorvastatin (LIPITOR) 10 MG tablet Take 10 mg by mouth at bedtime.     . cephALEXin (KEFLEX) 500 MG capsule Take 1 capsule (500 mg total) by mouth 4 (four) times daily. Take dose after dialysis on dialysis days. 14 capsule 0  . cinacalcet (SENSIPAR) 30 MG tablet Take 30 mg by mouth at bedtime.    Marland Kitchen diltiazem (CARDIZEM CD) 240 MG 24 hr capsule Take 1 capsule (240 mg total) by mouth daily. 30 capsule 0  . furosemide (LASIX) 40 MG tablet Take 40 mg by mouth 3 (three) times a week. Pt takes on Tuesday, Thursday, and Saturday.    . levothyroxine (SYNTHROID, LEVOTHROID) 50 MCG tablet Take 50 mcg by mouth daily before breakfast.     . medroxyPROGESTERone (PROVERA) 5 MG tablet Take 5 mg by mouth 2 (two) times daily.     . metoprolol succinate (TOPROL-XL) 25 MG 24 hr tablet Take 25 mg by mouth at bedtime.     . mirtazapine (REMERON) 30 MG tablet Take 30 mg by mouth at bedtime.    . multivitamin (RENA-VIT) TABS tablet Take 1 tablet by mouth every evening.    . sevelamer carbonate (RENVELA) 800 MG tablet Take 800 mg by mouth every evening.    . vitamin B-12 (CYANOCOBALAMIN) 1000 MCG tablet Take 1,000 mcg by mouth every evening.    . warfarin (COUMADIN) 3 MG tablet Take 3 mg by mouth every evening.     Labs: Basic Metabolic Panel:  Recent Labs Lab 12/08/16 1153 12/08/16 1209  NA 141 140  K 4.4 4.4  CL 103 102  CO2 26  --   GLUCOSE 106* 99  BUN 26* 30*  CREATININE 9.08* 9.30*  CALCIUM 8.2*  --  CBC:  Recent Labs Lab 12/08/16 1153 12/08/16 1209  WBC 6.0  --   HGB 11.3* 11.9*  HCT 37.1* 35.0*  MCV 95.9  --   PLT 156  --    Studies/Results: Dg Chest Port 1 View  Result Date: 12/08/2016 CLINICAL DATA:  Weakness, tachycardia, atrial fibrillation. EXAM: PORTABLE CHEST 1 VIEW COMPARISON:  09/27/2015. FINDINGS: Trachea is midline. Heart size is accentuated by AP technique and low lung volumes. Thoracic aorta is calcified. Bibasilar volume loss. Tiny  left pleural effusion. No airspace consolidation. IMPRESSION: Bibasilar atelectasis with a tiny left pleural effusion. Electronically Signed   By: Lorin Picket M.D.   On: 12/08/2016 12:47    Dialysis Orders: MWF at [facility] (records pending; requested)  Assessment/Plan: 1.  Afib with RVR: Per primary. 2.  ESRD: Missed HD today, no urgent need for HD today, will plan on HD tomorrow morning to make up for missed session today. 3.  Hypertension/volume: BP fine. Keep same EDW, meds for now. 4.  Anemia: Hgb 11.9. No ESA needed yet. 5.  Metabolic bone disease: Ca ok. Phos pending. Continue Renvela as binder. Will review records and add VDRA if needed. 6.  Hx Prostate Ca: with obstructive symptoms, foley in place. per primary.  Veneta Penton, PA-C 12/08/2016, 4:00 PM  [facility] Pager: [phone]  Pt seen, examined and agree w A/P as above. ESRD pt with afib / RVR and gen'd weakness. Missed HD today but no urgent need, plan for HD tomorrow.   [name] MD [employer] pager [phone]   12/08/2016, 4:48 PM

## 2016-12-08 NOTE — Progress Notes (Signed)
Cardiology Consult Note  Admit date: 12/08/2016 Name: Steven [name] 83 y.o.  male DOB:  1926-10-21 MRN:  [id number]  Today's date:  12/08/2016  Referring Physician:    [facility]  Primary Physician:    Dr. Orpah Melter  Reason for Consultation:   Rapid atrial fibrillation   IMPRESSIONS: 1.  Atrial fibrillation with rapid response 2.  Chronic diastolic heart failure with volume overload with no dialysis in close to 3 days 3.  End-stage renal disease 4.  Mitral regurgitation 5.  Calcified aorta 6.  Chronic amiodarone therapy 7.  Subtherapeutic anticoagulation  RECOMMENDATION: He has had atrial fibrillation probably for over a year now.  He has rapid response and is volume overloaded but not having had dialysis in several days.  He is subtherapeutic on his anticoagulation.  1.  Repeat echocardiogram 2.  Intravenous diltiazem to control atrial fibrillation rate 3.  Consider TEE cardioversion to see if we can maintain sinus rhythm once his dialysis and volume status has been taken care off 4.  Check TSH  HISTORY: This 83 year old male has a history of paroxysmal atrial fibrillation.  He was last seen by me in June 2017.  He had cardioversion in 2012 and later in 2014 at the time of surgery had cardioversion that was unsuccessful.  He later came back in normal sinus rhythm on amiodarone however.  He remained on amiodarone over the years and was in sinus rhythm through 2016.  When I saw him for routine follow-up in May 2017 he had stopped for some reason taken his amiodarone and was back in atrial fibrillation.  I started him back on his amiodarone and arranged for him to have a echocardiogram.  He was not willing to do this at the office and had it done at the hospital in July and was supposed to see me following the echocardiogram but never returned for follow-up afterwards.  He was admitted to the hospital today having last dialyzed on Friday.  He was weak over the weekend and  this morning was too weak to get up and move around and noticed his pulse was rapid and EMS brought him to the hospital where he was found to be in rapid atrial fibrillation.  His anticoagulation is currently subtherapeutic.  He currently lives at alone at home but does have a lady friend who helps him.  He has no angina.  He does have some dyspnea and does have some mild orthopnea at the present time and appears grossly volume overloaded.  He has had some difficulty with hypotension during dialysis and uses midodrine at times.  We had difficulty controlling his atrial fibrillation rate previously also.    Past Medical History:  Diagnosis Date  . Anemia   . Atrial fibrillation ([place])   . BPH (benign prostatic hyperplasia)   . CKD (chronic kidney disease), stage IV ([place])   . Gout   . HX: long term anticoagulant use   . Hypercalcemia   . Hypertension   . Kidney stone   . Mitral valve disorders(424.0)   . Osteoarthritis   . Prostate CA ([place])   . PVC (premature ventricular contraction)   . Vitamin D deficiency      Past Surgical History:  Procedure Laterality Date  . EYE SURGERY Bilateral    cataract extraction with IOL  . JOINT REPLACEMENT Left 2012   knee  . KIDNEY STONE SURGERY    . SP AV DIALYSIS SHUNT ACCESS EXISTING *L* Left    no  dialysis  . TOTAL KNEE ARTHROPLASTY Right 06/06/2013   Procedure: RIGHT TOTAL KNEE ARTHROPLASTY;  Surgeon: Mauri Pole, MD;  Location: [facility];  Service: Orthopedics;  Laterality: Right;    Allergies:  is allergic to hydrocodone and penicillins.   Medications: Prior to Admission medications   Medication Sig Start Date End Date Taking? Authorizing Provider  acetaminophen (TYLENOL) 500 MG tablet Take 1,000 mg by mouth every 6 (six) hours as needed for mild pain, moderate pain, fever or headache.    Yes [provider]  allopurinol (ZYLOPRIM) 100 MG tablet Take 100 mg by mouth daily.    Yes [provider]  amiodarone (PACERONE) 200  MG tablet Take 200 mg by mouth daily.   Yes [provider]  atorvastatin (LIPITOR) 10 MG tablet Take 10 mg by mouth daily after supper.    Yes [provider]  diltiazem (CARDIZEM CD) 240 MG 24 hr capsule Take 1 capsule (240 mg total) by mouth daily. 06/14/13  Yes Babish, Rodman Key, PA-C  furosemide (LASIX) 40 MG tablet Take 40 mg by mouth See admin instructions. Take 1 tablet (40 mg) by mouth on Tuesday, Thursday, Saturday mornings   Yes [provider]  levothyroxine (SYNTHROID, LEVOTHROID) 50 MCG tablet Take 50 mcg by mouth daily.    Yes [provider]  medroxyPROGESTERone (PROVERA) 5 MG tablet Take 5 mg by mouth 2 (two) times daily.    Yes [provider]  metoprolol succinate (TOPROL-XL) 25 MG 24 hr tablet Take 25 mg by mouth daily after supper.    Yes [provider]  midodrine (PROAMATINE) 10 MG tablet Take 10 mg by mouth See admin instructions. Take 1 tablet (10 mg) by mouth on Monday, Wednesday, Friday mornings - before dialysis   Yes [provider]  mirtazapine (REMERON) 30 MG tablet Take 30 mg by mouth daily after supper.    Yes [provider]  multivitamin (RENA-VIT) TABS tablet Take 1 tablet by mouth daily.    Yes [provider]  sevelamer carbonate (RENVELA) 800 MG tablet Take 800 mg by mouth daily after supper.    Yes [provider]  tamsulosin (FLOMAX) 0.4 MG CAPS capsule Take 0.4 mg by mouth daily.   Yes [provider]  vitamin B-12 (CYANOCOBALAMIN) 1000 MCG tablet Take 1,000 mcg by mouth daily after supper.    Yes [provider]  warfarin (COUMADIN) 4 MG tablet Take 2 mg by mouth every evening.   Yes [provider]    Family History: Family Status  Relation Status  . Father Deceased at age 24        myocardial infarction  . Mother Deceased at age 5       ovarian cancer  . Brother Deceased at age 59       myocardial infarction  . Son Deceased at age 50        cancer of pancreas    Social History:   reports that he is a non-smoker but has been exposed to tobacco smoke. He has quit using smokeless tobacco. He reports that he drinks alcohol. He reports that he does not use drugs.   Review of Systems: Other than as noted above the remainder of the review of systems is unremarkable.    Physical Exam: BP 122/64   Pulse (!) 59   Temp 97.7 F (36.5 C) (Oral)   Resp (!) 21   Ht 5\' 11"  (1.803 m)   Wt 97.5 kg (215 lb)  SpO2 91%   BMI 29.99 kg/m   General appearance: He is a friendly elderly male mildly obese mildly short of breath Head: Normocephalic, without obvious abnormality, atraumatic Eyes: conjunctivae/corneas clear. PERRL, EOM's intact. Fundi not examined Neck: no adenopathy, no carotid bruit, supple, symmetrical, trachea midline and JVD noted Lungs: Reduced breath sound both bases Heart: Irregular rhythm, normal S1 and S2, no S3, 2/6 murmur Abdomen: soft, non-tender; bowel sounds normal; no masses,  no organomegaly Rectal: deferred Extremities: Large fistula present in the left upper arm, 2+ peripheral edema, scars from knee surgery bilaterally, toenails hypertrophic Pulses: 2+ and symmetric Skin: Skin color, texture, turgor normal. No rashes or lesions Neurologic: Grossly normal Psych: Alert and oriented x 3 Labs: CBC  Recent Labs  12/08/16 1153 12/08/16 1209  WBC 6.0  --   RBC 3.87*  --   HGB 11.3* 11.9*  HCT 37.1* 35.0*  PLT 156  --   MCV 95.9  --   MCH 29.2  --   MCHC 30.5  --   RDW 17.5*  --    CMP   Recent Labs  12/08/16 1153 12/08/16 1209  NA 141 140  K 4.4 4.4  CL 103 102  CO2 26  --   GLUCOSE 106* 99  BUN 26* 30*  CREATININE 9.08* 9.30*  CALCIUM 8.2*  --   GFRNONAA 5*  --   GFRAA 5*  --    BNP (last 3 results) BNP    Component Value Date/Time   BNP 777.6 (H) 12/08/2016 1153   Radiology:  Cardiomegaly, calcified aorta, small left effusion  EKG: Atrial fibrillation with rapid  ventricular response, right bundle branch block Independently reviewed by me  Signed:  W. Doristine Church MD [facility]   Cardiology Consultant  12/08/2016, 7:09 PM

## 2016-12-08 NOTE — H&P (Signed)
History and Physical    Steven [name] NLG:[id number] DOB: 05/02/1927 DOA: 12/08/2016   PCP: Briscoe Deutscher, MD   Patient coming from:  Home    Chief Complaint: Shortness of breath   HPI: Steven [name] is a 83 y.o. male with medical history significant for HTN, HLD, ESRD on HD MWF, Atrial fibrillation on COumadin , presenting to the ED with 3-4 day complaint of progressive shortness of breath, missing HD today in order to be seen at the ED .Denies chest pain or discomfort. He denies any palpitations. He denies any cough or upper respiratory illnesses. No fevers, chills or night sweats. Denies any abdominal pain, nausea, vomiting or diarrhea. Patient denies any dysuria. He does have a Foley on the right lower extremity, making small amount of urine at baseline. The patient is compliant with home medications has not been able to take his meds this morning. He denies any lateral numbness or weakness. No confusion is reported.   ED Course:  BP 118/69   Pulse (!) 107   Temp 98.4 F (36.9 C) (Oral)   Resp (!) 23   Ht 5\' 11"  (1.803 m)   Wt 97.5 kg (215 lb)   SpO2 97%   BMI 29.99 kg/m    the patient was found to be tachycardic, with rates between 120 and 150, for which he received Cardizem 20 mgx 1 IV, and then placed on IV Cardizem drip. The rate is currently above the 100s. Cardiology . Dr. Wynonia Lawman has been notified.  White count is 6 hemoglobin 11.3 platelets 156 sodium 141 potassium 4.4 bicarb 26 BUN  26 creatinine 9.08 PT 21.9 INR 1.88 EKG shows atrial fibrillation, right bundle branch block, QT prolongation. 595. Nephrology is to consult the patient, for possible hemodialysis.   Review of Systems:  As per HPI otherwise all other systems reviewed and are negative  Past Medical History:  Diagnosis Date  . Anemia   . Atrial fibrillation ([place])   . BPH (benign prostatic hyperplasia)   . CKD (chronic kidney disease), stage IV ([place])   . Gout   . HX: long term anticoagulant use   .  Hypercalcemia   . Hypertension   . Kidney stone   . Mitral valve disorders(424.0)   . Osteoarthritis   . Prostate CA ([place])   . PVC (premature ventricular contraction)   . Vitamin D deficiency     Past Surgical History:  Procedure Laterality Date  . EYE SURGERY Bilateral    cataract extraction with IOL  . JOINT REPLACEMENT Left 2012   knee  . KIDNEY STONE SURGERY    . SP AV DIALYSIS SHUNT ACCESS EXISTING *L* Left    no dialysis  . TOTAL KNEE ARTHROPLASTY Right 06/06/2013   Procedure: RIGHT TOTAL KNEE ARTHROPLASTY;  Surgeon: Mauri Pole, MD;  Location: [facility];  Service: Orthopedics;  Laterality: Right;    Social History Social History   Social History  . Marital status: Widowed    Spouse name: N/A  . Number of children: N/A  . Years of education: N/A   Occupational History  . Not on file.   Social History Main Topics  . Smoking status: Passive Smoke Exposure - Never Smoker    Types: Cigars  . Smokeless tobacco: Former Systems developer  . Alcohol use Yes     Comment: attended [employer]  . Drug use: No  . Sexual activity: Not on file   Other Topics Concern  . Not on file  Social History Narrative  . No narrative on file     Allergies  Allergen Reactions  . Hydrocodone Other (See Comments)    Reaction:  Confusion   . Penicillins Hives and Other (See Comments)    Has patient had a PCN reaction causing immediate rash, facial/tongue/throat swelling, SOB or lightheadedness with hypotension: No Has patient had a PCN reaction causing severe rash involving mucus membranes or skin necrosis: No Has patient had a PCN reaction that required hospitalization No Has patient had a PCN reaction occurring within the last 10 years: No If all of the above answers are "NO", then may proceed with Cephalosporin use.    No family history on file.    Prior to Admission medications   Medication Sig Start Date End Date Taking? Authorizing Provider  acetaminophen (TYLENOL) 500 MG tablet  Take 1,000 mg by mouth every 6 (six) hours as needed for mild pain, moderate pain, fever or headache.     [provider]  allopurinol (ZYLOPRIM) 100 MG tablet Take 100 mg by mouth daily.     [provider]  amiodarone (PACERONE) 200 MG tablet Take 200 mg by mouth daily.    [provider]  atorvastatin (LIPITOR) 10 MG tablet Take 10 mg by mouth at bedtime.     [provider]  cephALEXin (KEFLEX) 500 MG capsule Take 1 capsule (500 mg total) by mouth 4 (four) times daily. Take dose after dialysis on dialysis days. 11/17/16   Isla Pence, MD  cinacalcet (SENSIPAR) 30 MG tablet Take 30 mg by mouth at bedtime.    [provider]  diltiazem (CARDIZEM CD) 240 MG 24 hr capsule Take 1 capsule (240 mg total) by mouth daily. 06/14/13   [name], PA-C  furosemide (LASIX) 40 MG tablet Take 40 mg by mouth 3 (three) times a week. Pt takes on Tuesday, Thursday, and Saturday.    [provider]  levothyroxine (SYNTHROID, LEVOTHROID) 50 MCG tablet Take 50 mcg by mouth daily before breakfast.     [provider]  medroxyPROGESTERone (PROVERA) 5 MG tablet Take 5 mg by mouth 2 (two) times daily.     [provider]  metoprolol succinate (TOPROL-XL) 25 MG 24 hr tablet Take 25 mg by mouth at bedtime.     [provider]  mirtazapine (REMERON) 30 MG tablet Take 30 mg by mouth at bedtime.    [provider]  multivitamin (RENA-VIT) TABS tablet Take 1 tablet by mouth every evening.    [provider]  sevelamer carbonate (RENVELA) 800 MG tablet Take 800 mg by mouth every evening.    [provider]  vitamin B-12 (CYANOCOBALAMIN) 1000 MCG tablet Take 1,000 mcg by mouth every evening.    [provider]  warfarin (COUMADIN) 3 MG tablet Take 3 mg by mouth every evening.    [provider]    Physical Exam:  Vitals:   12/08/16 1345 12/08/16 1400 12/08/16 1415 12/08/16 1430  BP: 111/79  111/74 111/61 118/69  Pulse: 99 (!) 102 (!) 105 (!) 107  Resp: (!) 25 (!) 25 (!) 26 (!) 23  Temp:      TempSrc:      SpO2: 93% 95% 94% 97%  Weight:      Height:       Constitutional: NAD, calm, comfortable, ill appearing, unkempt  Eyes: PERRL, lids and conjunctivae normal ENMT: Mucous membranes are moist, without exudate or lesions. Edentulous  Neck: normal, supple, no masses, no thyromegaly  Respiratory: clear to auscultation bilaterally, no wheezing, no crackles. Normal respiratory effort  Cardiovascular: Irregular rate and rhythm, no murmurs, rubs or gallops. 1-2 + lower extremity edema. 2+ pedal pulses. No carotid bruits.  Abdomen: Soft, non tender, No hepatosplenomegaly. Bowel sounds positive.  Musculoskeletal: no clubbing / cyanosis. Moves all extremities Skin: no jaundice, No lesions.  Neurologic: Sensation intact  Strength equal in all extremities Psychiatric:   Alert and oriented x 3. Normal mood.     Labs on Admission: I have personally reviewed following labs and imaging studies  CBC:  Recent Labs Lab 12/08/16 1153 12/08/16 1209  WBC 6.0  --   HGB 11.3* 11.9*  HCT 37.1* 35.0*  MCV 95.9  --   PLT 156  --     Basic Metabolic Panel:  Recent Labs Lab 12/08/16 1153 12/08/16 1209  NA 141 140  K 4.4 4.4  CL 103 102  CO2 26  --   GLUCOSE 106* 99  BUN 26* 30*  CREATININE 9.08* 9.30*  CALCIUM 8.2*  --     GFR: Estimated Creatinine Clearance: 6.5 mL/min (A) (by C-G formula based on SCr of 9.3 mg/dL (H)).  Liver Function Tests: No results for input(s): AST, ALT, ALKPHOS, BILITOT, PROT, ALBUMIN in the last 168 hours. No results for input(s): LIPASE, AMYLASE in the last 168 hours. No results for input(s): AMMONIA in the last 168 hours.  Coagulation Profile:  Recent Labs Lab 12/08/16 1153  INR 1.88    Cardiac Enzymes: No results for input(s): CKTOTAL, CKMB, CKMBINDEX, TROPONINI in the last 168 hours.  BNP (last 3 results) No results for input(s):  PROBNP in the last 8760 hours.  HbA1C: No results for input(s): HGBA1C in the last 72 hours.  CBG: No results for input(s): GLUCAP in the last 168 hours.  Lipid Profile: No results for input(s): CHOL, HDL, LDLCALC, TRIG, CHOLHDL, LDLDIRECT in the last 72 hours.  Thyroid Function Tests: No results for input(s): TSH, T4TOTAL, FREET4, T3FREE, THYROIDAB in the last 72 hours.  Anemia Panel: No results for input(s): VITAMINB12, FOLATE, FERRITIN, TIBC, IRON, RETICCTPCT in the last 72 hours.  Urine analysis:    Component Value Date/Time   COLORURINE YELLOW 11/17/2016 1050   APPEARANCEUR TURBID (A) 11/17/2016 1050   LABSPEC 1.012 11/17/2016 1050   PHURINE 7.0 11/17/2016 1050   GLUCOSEU NEGATIVE 11/17/2016 1050   HGBUR SMALL (A) 11/17/2016 1050   BILIRUBINUR NEGATIVE 11/17/2016 1050   KETONESUR NEGATIVE 11/17/2016 1050   PROTEINUR >=300 (A) 11/17/2016 1050   UROBILINOGEN 0.2 06/01/2013 1316   NITRITE NEGATIVE 11/17/2016 1050   LEUKOCYTESUR LARGE (A) 11/17/2016 1050    Sepsis Labs: @LABRCNTIP (procalcitonin:4,lacticidven:4) )No results found for this or any previous visit (from the past 240 hour(s)).   Radiological Exams on Admission: Dg Chest Port 1 View  Result Date: 12/08/2016 CLINICAL DATA:  Weakness, tachycardia, atrial fibrillation. EXAM: PORTABLE CHEST 1 VIEW COMPARISON:  09/27/2015. FINDINGS: Trachea is midline. Heart size is accentuated by AP technique and low lung volumes. Thoracic aorta is calcified. Bibasilar volume loss. Tiny left pleural effusion. No airspace consolidation. IMPRESSION: Bibasilar atelectasis with a tiny left pleural effusion. Electronically Signed   By: Lorin Picket M.D.   On: 12/08/2016 12:47    EKG: Independently reviewed.  Assessment/Plan Active Problems:   Atrial fibrillation (HCC)   Shortness of breath   Obesity (BMI 30-39.9)   Hypertensive heart disease   Mitral valve disease   Long term current use of anticoagulant therapy    Hyperlipidemia  ESRD (end stage renal disease) ([place])     Acute dyspnea in the setting of  volume overload due to missing dialysis today and Afib with RVR Not hypoxic EKG shows atrial fibrillation, right bundle branch block, QT prolongation. 595. Last echo in 01/2016 moderate LVH. Nl LVF and EF 50%   to 55%  Tn pending BNP pending Current weight 97.5  kg  Afebrile. WBC 6  chest x-ray  bibasilar atelectasis with tiny L pleural effusion  Admit to SDU   Continue Cardizem drip Appreciate Cardiology evaluation as called by EDP  Continue OP meds  -monitor I/Os -daily weights -prn 02 2 D echo  ESRD on HD MWF, missing dialysis today, Cr 9.3   Dr.Schertz notified. Renal Diet. Home Lasix today as his next HD is to be tomorrow per Renal note Other plans as per Nephrology Check CMET in am  Atrial Fibrillation with RVR on admission. On AC with Coumadin . patient was found to be tachycardic, with rates between 120 and 150, for which he received Cardizem 20 mgx 1 IV, and then placed on IV Cardizem drip. The rate is currently above the 100s   CHA2DS2-VASc score 3-4 PT 21.9 INR 1.88 Continue Cardizem drip Continue meds . Other plans as per Cards    Hypertension BP 118/69   Pulse   107    Continue home anti-hypertensive medications at bedtime   Hyperlipidemia Continue home statins   Anemia of chronic disease and B12 deficiency Hemoglobin on admission 11.9  At baseline  Repeat CBC in am Continue B12 supplement No intervention is indicated at this time   Hypothyroidism: Continue home Synthroid   Deconditioning OT/PT   Gout Contine Zyloprim    DVT prophylaxis: Coumadin Code Status:   Full    Family Communication:  Discussed with patient Disposition Plan: Expect patient to be discharged to home after condition improves Consults called:    Cardiology, Dr. Wynonia Lawman.                                   and Renal per EDP   Admission status:SDU    Rondel Jumbo, PA-C [facility]   12/08/2016, 4:11 PM

## 2016-12-08 NOTE — ED Provider Notes (Addendum)
[facility] DEPT Provider Note   CSN: [id number] Arrival date & time: 12/08/16  1105     History   Chief Complaint Chief Complaint  Patient presents with  . Weakness  . Tachycardia    HPI Steven [name] is a 83 y.o. male.  Patient w hx afib, esrd on hd, c/o generalized weakness in the past few days.  States he felt took weak to go to dialysis today. Did go to his normal dialysis 3 days ago. Mild sob. No chest pain or discomfort. Denies cough or uri c/o. No fever or chills. No headache. No abd pain. No nvd. No dysuria - has foley, makes small amount urine at baseline. States compliant w home meds. No unilateral numbness or weakness.    The history is provided by the patient and the EMS personnel.    Past Medical History:  Diagnosis Date  . Anemia   . Atrial fibrillation ([place])   . BPH (benign prostatic hyperplasia)   . CKD (chronic kidney disease), stage IV ([place])   . Gout   . HX: long term anticoagulant use   . Hypercalcemia   . Hypertension   . Kidney stone   . Mitral valve disorders(424.0)   . Osteoarthritis   . Prostate CA ([place])   . PVC (premature ventricular contraction)   . Vitamin D deficiency     Patient Active Problem List   Diagnosis Date Noted  . Chronic kidney disease, stage IV (severe) ([place])   . Long-term (current) use of anticoagulants   . Hyperlipidemia   . Prostate cancer ([place])   . Acute blood loss anemia 06/08/2013  . Obesity (BMI 30-39.9)   . Atrial fibrillation ([place])   . Hypertensive heart disease   . Mitral valve disorders(424.0)   . S/P right TKA 06/06/2013    Past Surgical History:  Procedure Laterality Date  . EYE SURGERY Bilateral    cataract extraction with IOL  . JOINT REPLACEMENT Left 2012   knee  . KIDNEY STONE SURGERY    . SP AV DIALYSIS SHUNT ACCESS EXISTING *L* Left    no dialysis  . TOTAL KNEE ARTHROPLASTY Right 06/06/2013   Procedure: RIGHT TOTAL KNEE ARTHROPLASTY;  Surgeon: Mauri Pole, MD;  Location: [facility];   Service: Orthopedics;  Laterality: Right;       Home Medications    Prior to Admission medications   Medication Sig Start Date End Date Taking? Authorizing Provider  acetaminophen (TYLENOL) 500 MG tablet Take 1,000 mg by mouth every 6 (six) hours as needed for mild pain, moderate pain, fever or headache.     [provider]  allopurinol (ZYLOPRIM) 100 MG tablet Take 100 mg by mouth daily.     [provider]  amiodarone (PACERONE) 200 MG tablet Take 200 mg by mouth daily.    [provider]  atorvastatin (LIPITOR) 10 MG tablet Take 10 mg by mouth at bedtime.     [provider]  cephALEXin (KEFLEX) 500 MG capsule Take 1 capsule (500 mg total) by mouth 4 (four) times daily. Take dose after dialysis on dialysis days. 11/17/16   Isla Pence, MD  cinacalcet (SENSIPAR) 30 MG tablet Take 30 mg by mouth at bedtime.    [provider]  diltiazem (CARDIZEM CD) 240 MG 24 hr capsule Take 1 capsule (240 mg total) by mouth daily. 06/14/13   [name], PA-C  furosemide (LASIX) 40 MG tablet Take 40 mg by mouth 3 (three) times a week. Pt takes  on Tuesday, Thursday, and Saturday.    [provider]  levothyroxine (SYNTHROID, LEVOTHROID) 50 MCG tablet Take 50 mcg by mouth daily before breakfast.     [provider]  medroxyPROGESTERone (PROVERA) 5 MG tablet Take 5 mg by mouth 2 (two) times daily.     [provider]  metoprolol succinate (TOPROL-XL) 25 MG 24 hr tablet Take 25 mg by mouth at bedtime.     [provider]  mirtazapine (REMERON) 30 MG tablet Take 30 mg by mouth at bedtime.    [provider]  multivitamin (RENA-VIT) TABS tablet Take 1 tablet by mouth every evening.    [provider]  sevelamer carbonate (RENVELA) 800 MG tablet Take 800 mg by mouth every evening.    [provider]  vitamin B-12 (CYANOCOBALAMIN) 1000 MCG tablet Take 1,000 mcg by mouth every evening.    [provider]  warfarin (COUMADIN) 3 MG tablet Take 3 mg by mouth every evening.    [provider]    Family History No family history on file.  Social History Social History  Substance Use Topics  . Smoking status: Passive Smoke Exposure - Never Smoker    Types: Cigars  . Smokeless tobacco: Former Systems developer  . Alcohol use Yes     Comment: attended [employer]     Allergies   Hydrocodone and Penicillins   Review of Systems Review of Systems  Constitutional: Negative for fever.  HENT: Negative for sore throat.   Eyes: Negative for visual disturbance.  Respiratory: Negative for shortness of breath.   Cardiovascular: Positive for palpitations. Negative for chest pain and leg swelling.  Gastrointestinal: Negative for abdominal pain, diarrhea and vomiting.  Genitourinary: Negative for flank pain.  Musculoskeletal: Negative for back pain and neck pain.  Skin: Negative for rash.  Neurological: Positive for weakness. Negative for speech difficulty, numbness and headaches.  Hematological: Does not bruise/bleed easily.  Psychiatric/Behavioral: Negative for confusion.     Physical Exam Updated Vital Signs BP 104/61 (BP Location: Right Arm)   Pulse (!) 115   Temp 98.4 F (36.9 C) (Oral)   Resp (!) 24   Ht 5\' 11"  (1.803 m)   Wt 97.5 kg   SpO2 95%   BMI 29.99 kg/m   Physical Exam  Constitutional: He appears well-developed and well-nourished. No distress.  HENT:  Head: Atraumatic.  Mouth/Throat: Oropharynx is clear and moist.  Eyes: Conjunctivae are normal. Pupils are equal, round, and reactive to light.  Neck: Neck supple. No tracheal deviation present. No thyromegaly present.  Cardiovascular: Intact distal pulses.  Exam reveals no gallop and no friction rub.   No murmur heard. Irregular rhythm, tachycardic.   Pulmonary/Chest: Effort normal and breath sounds normal. No accessory muscle usage. No respiratory distress.  Abdominal: Soft. Bowel sounds are normal. He  exhibits no distension. There is no tenderness.  Genitourinary:  Genitourinary Comments: No cva tenderness  Musculoskeletal:  Bilateral leg edema. LUE av fistula w palp thrill.   Neurological: He is alert.  Speech normal, fluent. Motor intact bil, stre 5/5. sens grossly intact.   Skin: Skin is warm and dry. He is not diaphoretic.  Psychiatric: He has a normal mood and affect.  Nursing note and vitals reviewed.    ED Treatments / Results  Labs (all labs ordered are listed, but only abnormal results are displayed) Results for orders placed or performed during the hospital encounter of 12/08/16  CBC  Result Value Ref Range   WBC  6.0 4.0 - 10.5 K/uL   RBC 3.87 (L) 4.22 - 5.81 MIL/uL   Hemoglobin 11.3 (L) 13.0 - 17.0 g/dL   HCT 37.1 (L) 39.0 - 52.0 %   MCV 95.9 78.0 - 100.0 fL   MCH 29.2 26.0 - 34.0 pg   MCHC 30.5 30.0 - 36.0 g/dL   RDW 17.5 (H) 11.5 - 15.5 %   Platelets 156 150 - 400 K/uL  Basic metabolic panel  Result Value Ref Range   Sodium 141 135 - 145 mmol/L   Potassium 4.4 3.5 - 5.1 mmol/L   Chloride 103 101 - 111 mmol/L   CO2 26 22 - 32 mmol/L   Glucose, Bld 106 (H) 65 - 99 mg/dL   BUN 26 (H) 6 - 20 mg/dL   Creatinine, Ser 9.08 (H) 0.61 - 1.24 mg/dL   Calcium 8.2 (L) 8.9 - 10.3 mg/dL   GFR calc non Af Amer 5 (L) >60 mL/min   GFR calc Af Amer 5 (L) >60 mL/min   Anion gap 12 5 - 15  Protime-INR  Result Value Ref Range   Prothrombin Time 21.9 (H) 11.4 - 15.2 seconds   INR 1.88   I-stat chem 8, ed  Result Value Ref Range   Sodium 140 135 - 145 mmol/L   Potassium 4.4 3.5 - 5.1 mmol/L   Chloride 102 101 - 111 mmol/L   BUN 30 (H) 6 - 20 mg/dL   Creatinine, Ser 9.30 (H) 0.61 - 1.24 mg/dL   Glucose, Bld 99 65 - 99 mg/dL   Calcium, Ion 0.96 (L) 1.15 - 1.40 mmol/L   TCO2 28 0 - 100 mmol/L   Hemoglobin 11.9 (L) 13.0 - 17.0 g/dL   HCT 35.0 (L) 39.0 - 52.0 %   Dg Chest Port 1 View  Result Date: 12/08/2016 CLINICAL DATA:  Weakness, tachycardia, atrial fibrillation.  EXAM: PORTABLE CHEST 1 VIEW COMPARISON:  09/27/2015. FINDINGS: Trachea is midline. Heart size is accentuated by AP technique and low lung volumes. Thoracic aorta is calcified. Bibasilar volume loss. Tiny left pleural effusion. No airspace consolidation. IMPRESSION: Bibasilar atelectasis with a tiny left pleural effusion. Electronically Signed   By: Lorin Picket M.D.   On: 12/08/2016 12:47    EKG  EKG Interpretation  Date/Time:  Monday Dec 08 2016 11:21:36 EDT Ventricular Rate:  127 PR Interval:    QRS Duration: 168 QT Interval:  409 QTC Calculation: 595 R Axis:   -142 Text Interpretation:  Atrial fibrillation Right bundle branch block QT prolonged Confirmed by Ashok Cordia  MD, Lennette Bihari ([id number]) on 12/08/2016 11:27:07 AM       Radiology Dg Chest Port 1 View  Result Date: 12/08/2016 CLINICAL DATA:  Weakness, tachycardia, atrial fibrillation. EXAM: PORTABLE CHEST 1 VIEW COMPARISON:  09/27/2015. FINDINGS: Trachea is midline. Heart size is accentuated by AP technique and low lung volumes. Thoracic aorta is calcified. Bibasilar volume loss. Tiny left pleural effusion. No airspace consolidation. IMPRESSION: Bibasilar atelectasis with a tiny left pleural effusion. Electronically Signed   By: Lorin Picket M.D.   On: 12/08/2016 12:47    Procedures Procedures (including critical care time)  Medications Ordered in ED Medications  0.9 %  sodium chloride infusion (10 mL/hr Intravenous New Bag/Given 12/08/16 1152)  diltiazem (CARDIZEM) 1 mg/mL load via infusion 10 mg (10 mg Intravenous Bolus from Bag 12/08/16 1141)    And  diltiazem (CARDIZEM) 100 mg in dextrose 5% 153mL (1 mg/mL) infusion (5 mg/hr Intravenous New Bag/Given 12/08/16 1141)     Initial  Impression / Assessment and Plan / ED Course  I have reviewed the triage vital signs and the nursing notes.  Pertinent labs & imaging results that were available during my care of the patient were reviewed by me and considered in my medical decision  making (see chart for details).  Continuous pulse ox and monitor. Ecg. Labs.   o2 [place].  Patient in rapid afib, cardizem bolus and gtt.  Reviewed nursing notes and prior charts for additional history.   Given esrd on hd, missed hd today, gen weakness and sob, and rapid afib, will admit.  Hospitalist consulted for admission.  Hospitalists will admit - they request renal and cardiology be consulted.  I discussed with Dr Jonnie Finner from renal - they will facilitate pts hd.  Discussed with Trish/cardiology - they will consult.      Final Clinical Impressions(s) / ED Diagnoses   Final diagnoses:  None    New Prescriptions New Prescriptions   No medications on file     Lajean Saver, MD 12/08/16 1411    Lajean Saver, MD 12/08/16 1455

## 2016-12-08 NOTE — ED Triage Notes (Signed)
Pt here from home via [facility] for weakness.  Dialysis MWF, but was unable to go to dialysis today d/t weakness.  HR 150's afib.  Initial bp was 92/58 that increased to 102/80.  Hx of cardioversion.

## 2016-12-08 NOTE — ED Notes (Signed)
Leg bag changed out.

## 2016-12-09 ENCOUNTER — Inpatient Hospital Stay (HOSPITAL_COMMUNITY): Payer: Medicare Other

## 2016-12-09 DIAGNOSIS — E782 Mixed hyperlipidemia: Secondary | ICD-10-CM

## 2016-12-09 DIAGNOSIS — Z7901 Long term (current) use of anticoagulants: Secondary | ICD-10-CM

## 2016-12-09 DIAGNOSIS — E669 Obesity, unspecified: Secondary | ICD-10-CM

## 2016-12-09 DIAGNOSIS — R531 Weakness: Secondary | ICD-10-CM

## 2016-12-09 LAB — RENAL FUNCTION PANEL
ALBUMIN: 2.8 g/dL — AB (ref 3.5–5.0)
ANION GAP: 11 (ref 5–15)
BUN: 32 mg/dL — AB (ref 6–20)
CALCIUM: 8.2 mg/dL — AB (ref 8.9–10.3)
CO2: 25 mmol/L (ref 22–32)
Chloride: 101 mmol/L (ref 101–111)
Creatinine, Ser: 10.18 mg/dL — ABNORMAL HIGH (ref 0.61–1.24)
GFR calc non Af Amer: 4 mL/min — ABNORMAL LOW (ref >60)
GFR, EST AFRICAN AMERICAN: 5 mL/min — AB (ref >60)
Glucose, Bld: 95 mg/dL (ref 65–99)
PHOSPHORUS: 3.3 mg/dL (ref 2.5–4.6)
Potassium: 4.8 mmol/L (ref 3.5–5.1)
SODIUM: 137 mmol/L (ref 135–145)

## 2016-12-09 LAB — URINALYSIS, ROUTINE W REFLEX MICROSCOPIC
BILIRUBIN URINE: NEGATIVE
GLUCOSE, UA: NEGATIVE mg/dL
Glucose, UA: NEGATIVE mg/dL
KETONES UR: 15 mg/dL — AB
KETONES UR: NEGATIVE mg/dL
NITRITE: POSITIVE — AB
Nitrite: NEGATIVE
PH: 6 (ref 5.0–8.0)
Protein, ur: >=300 mg/dL — AB
Specific Gravity, Urine: 1.016 (ref 1.005–1.030)
Specific Gravity, Urine: 1.02 (ref 1.005–1.030)
Squamous Epithelial / LPF: NONE SEEN
pH: 6.5 (ref 5.0–8.0)

## 2016-12-09 LAB — CBC
HCT: 39.1 % (ref 39.0–52.0)
HEMATOCRIT: 35.5 % — AB (ref 39.0–52.0)
HEMOGLOBIN: 10.9 g/dL — AB (ref 13.0–17.0)
HEMOGLOBIN: 12.3 g/dL — AB (ref 13.0–17.0)
MCH: 29.2 pg (ref 26.0–34.0)
MCH: 29.8 pg (ref 26.0–34.0)
MCHC: 30.7 g/dL (ref 30.0–36.0)
MCHC: 31.5 g/dL (ref 30.0–36.0)
MCV: 95.2 fL (ref 78.0–100.0)
MCV: 94.7 fL (ref 78.0–100.0)
PLATELETS: 143 10*3/uL — AB (ref 150–400)
Platelets: 157 10*3/uL (ref 150–400)
RBC: 3.73 MIL/uL — ABNORMAL LOW (ref 4.22–5.81)
RBC: 4.13 MIL/uL — AB (ref 4.22–5.81)
RDW: 17.2 % — ABNORMAL HIGH (ref 11.5–15.5)
RDW: 17.3 % — ABNORMAL HIGH (ref 11.5–15.5)
WBC: 5.8 10*3/uL (ref 4.0–10.5)
WBC: 6.5 10*3/uL (ref 4.0–10.5)

## 2016-12-09 LAB — PROTIME-INR
INR: 2.17
Prothrombin Time: 24.6 seconds — ABNORMAL HIGH (ref 11.4–15.2)

## 2016-12-09 LAB — COMPREHENSIVE METABOLIC PANEL
ALBUMIN: 2.8 g/dL — AB (ref 3.5–5.0)
ALT: 6 U/L — ABNORMAL LOW (ref 17–63)
ANION GAP: 12 (ref 5–15)
AST: 15 U/L (ref 15–41)
Alkaline Phosphatase: 109 U/L (ref 38–126)
BILIRUBIN TOTAL: 0.5 mg/dL (ref 0.3–1.2)
BUN: 30 mg/dL — ABNORMAL HIGH (ref 6–20)
CHLORIDE: 102 mmol/L (ref 101–111)
CO2: 26 mmol/L (ref 22–32)
Calcium: 8.3 mg/dL — ABNORMAL LOW (ref 8.9–10.3)
Creatinine, Ser: 9.9 mg/dL — ABNORMAL HIGH (ref 0.61–1.24)
GFR calc Af Amer: 5 mL/min — ABNORMAL LOW (ref >60)
GFR, EST NON AFRICAN AMERICAN: 4 mL/min — AB (ref >60)
GLUCOSE: 97 mg/dL (ref 65–99)
POTASSIUM: 4.4 mmol/L (ref 3.5–5.1)
Sodium: 140 mmol/L (ref 135–145)
TOTAL PROTEIN: 5.5 g/dL — AB (ref 6.5–8.1)

## 2016-12-09 LAB — URINALYSIS, MICROSCOPIC (REFLEX)

## 2016-12-09 MED ORDER — PENTAFLUOROPROP-TETRAFLUOROETH EX AERO: 1.0000 "application " | INHALATION_SPRAY | CUTANEOUS | Status: DC | PRN

## 2016-12-09 MED ORDER — LIDOCAINE HCL (PF) 1 % IJ SOLN: 5.0000 mL | INTRAMUSCULAR | Status: DC | PRN

## 2016-12-09 MED ORDER — LIDOCAINE-PRILOCAINE 2.5-2.5 % EX CREA
1.0000 | TOPICAL_CREAM | CUTANEOUS | Status: DC | PRN
Start: 2016-12-09 — End: 2016-12-09

## 2016-12-09 MED ORDER — ALTEPLASE 2 MG IJ SOLR: 2.0000 mg | Freq: Once | INTRAMUSCULAR | Status: DC | PRN

## 2016-12-09 MED ORDER — SODIUM CHLORIDE 0.9 % IV SOLN: 100.0000 mL | INTRAVENOUS | Status: DC | PRN

## 2016-12-09 MED ORDER — SODIUM CHLORIDE 0.9 % IV SOLN
100.0000 mL | INTRAVENOUS | Status: DC | PRN
Start: 2016-12-09 — End: 2016-12-10

## 2016-12-09 MED ORDER — TECHNETIUM TC 99M DIETHYLENETRIAME-PENTAACETIC ACID: 30.0000 | Freq: Once | INTRAVENOUS | Status: DC | PRN

## 2016-12-09 MED ORDER — LIDOCAINE-PRILOCAINE 2.5-2.5 % EX CREA: 1.0000 "application " | TOPICAL_CREAM | CUTANEOUS | Status: DC | PRN

## 2016-12-09 MED ORDER — WARFARIN SODIUM 2 MG PO TABS
3.0000 mg | ORAL_TABLET | Freq: Once | ORAL | Status: AC
  Administered 2016-12-09: 3 mg via ORAL
  Filled 2016-12-09: qty 1.5

## 2016-12-09 MED ORDER — HEPARIN SODIUM (PORCINE) 1000 UNIT/ML DIALYSIS: 1000.0000 [IU] | INTRAMUSCULAR | Status: DC | PRN

## 2016-12-09 MED ORDER — TECHNETIUM TO 99M ALBUMIN AGGREGATED
4.0000 | Freq: Once | INTRAVENOUS | Status: AC | PRN
  Administered 2016-12-09: 4 via INTRAVENOUS

## 2016-12-09 MED ORDER — HEPARIN SODIUM (PORCINE) 1000 UNIT/ML DIALYSIS: 4000.0000 [IU] | INTRAMUSCULAR | Status: DC | PRN

## 2016-12-09 MED ORDER — ZOLPIDEM TARTRATE 5 MG PO TABS
5.0000 mg | ORAL_TABLET | Freq: Once | ORAL | Status: AC
Start: 2016-12-09 — End: 2016-12-09
  Administered 2016-12-09: 5 mg via ORAL
  Filled 2016-12-09: qty 1

## 2016-12-09 NOTE — Progress Notes (Signed)
Subjective:  Says he feels better today.  No complaints of shortness of breath or chest pain.  Objective:  Vital Signs in the last 24 hours: BP (!) 109/54 (BP Location: Right Arm)   Pulse (!) 106   Temp 97.8 F (36.6 C) (Oral)   Resp (!) 22   Ht 5\' 11"  (1.803 m)   Wt 98 kg (216 lb)   SpO2 95%   BMI 30.13 kg/m   Physical Exam: Friendly white male in no acute distress Lungs:  Clear  Cardiac:  Irregular rhythm, normal S1 and S2, no S3, 1 to 2/6 systolic murmur Abdomen:  Soft, nontender, no masses Extremities: 2+ edema noted  Intake/Output from previous day: 05/14 0701 - 05/15 0700 In: 275 [I.V.:275] Out: -  Weight Filed Weights   12/08/16 1123 12/09/16 0500  Weight: 97.5 kg (215 lb) 98 kg (216 lb)    Lab Results: Basic Metabolic Panel:  Recent Labs  12/08/16 1959 12/09/16 0150  NA 139 140  K 4.1 4.4  CL 102 102  CO2 25 26  GLUCOSE 130* 97  BUN 29* 30*  CREATININE 9.51* 9.90*    CBC:  Recent Labs  12/08/16 1153 12/08/16 1209 12/09/16 0150  WBC 6.0  --  5.8  HGB 11.3* 11.9* 10.9*  HCT 37.1* 35.0* 35.5*  MCV 95.9  --  95.2  PLT 156  --  157    BNP    Component Value Date/Time   BNP 777.6 (H) 12/08/2016 1153    PROTIME: Lab Results  Component Value Date   INR 2.17 12/09/2016   INR 1.88 12/08/2016   INR 2.73 (H) 06/14/2013    Telemetry: Atrial fibrillation with mildly rapid response  Assessment/Plan:  1.  Atrial fibrillation persistent for at least a year 2.  Chronic diastolic heart failure with volume overload due to no dialysis in 3 days 3.  End-stage renal disease on dialysis 4.  Strip subtherapeutic anticoagulation  Recommendations:  He is feeling better today.  He needs to be dialyzed to deal with the volume overload.  Await echocardiogram today.  TSH is reasonable and liver enzymes were normal.  I think the best way to manage this would be to attempt a cardioversion to see if we can restore sinus rhythm.  This was the original plan  last summer however he did not return to the office following his echo at the hospital.     W. Doristine Church  MD [facility]  12/09/2016, 9:24 AM

## 2016-12-09 NOTE — Progress Notes (Signed)
[facility]  Indication: atrial fibrillation  Allergies  Allergen Reactions  . Hydrocodone Other (See Comments)    Reaction:  Confusion   . Penicillins Hives and Other (See Comments)    Has patient had a PCN reaction causing immediate rash, facial/tongue/throat swelling, SOB or lightheadedness with hypotension: No Has patient had a PCN reaction causing severe rash involving mucus membranes or skin necrosis: No Has patient had a PCN reaction that required hospitalization No Has patient had a PCN reaction occurring within the last 10 years: No If all of the above answers are "NO", then may proceed with Cephalosporin use.    Patient Measurements: Height: 5\' 11"  (180.3 cm) Weight: 219 lb 9.3 oz (99.6 kg) IBW/kg (Calculated) : 75.3  Vital Signs: Temp: 98.2 F (36.8 C) (05/15 1208) Temp Source: Oral (05/15 1145) BP: 110/52 (05/15 1208) Pulse Rate: 96 (05/15 1208)  Labs:  Recent Labs  12/08/16 1153 12/08/16 1209 12/08/16 1959 12/09/16 0150 12/09/16 1225  HGB 11.3* 11.9*  --  10.9*  --   HCT 37.1* 35.0*  --  35.5*  --   PLT 156  --   --  157  --   LABPROT 21.9*  --   --  24.6*  --   INR 1.88  --   --  2.17  --   CREATININE 9.08* 9.30* 9.51* 9.90* 10.18*    Estimated Creatinine Clearance: 6 mL/min (A) (by C-G formula based on SCr of 10.18 mg/dL (H)).   Medical History: Past Medical History:  Diagnosis Date  . Anemia   . Atrial fibrillation ([place])   . BPH (benign prostatic hyperplasia)   . CKD (chronic kidney disease), stage IV ([place])   . Gout   . HX: long term anticoagulant use   . Hypercalcemia   . Hypertension   . Kidney stone   . Mitral valve disorders(424.0)   . Osteoarthritis   . Prostate CA ([place])   . PVC (premature ventricular contraction)   . Vitamin D deficiency     Assessment: 83 yo male admitted with acute respiratory failure, on warfarin PTA for afib. Per note, DVT/PE is a possibility, V/Q scan  ordered. INR subtherapeutic upon admission at 1.88. PTA warfarin dose = 2 mg PO daily  -Hgb low, stable ~12, PLTC wnl, no bleeding noted -No new DDIs noted since admission -Diet ordered  Today's INR up to 2.17   Goal of Therapy:  INR 2-3 Monitor platelets by anticoagulation protocol: Yes   Plan:  Warfarin 3 mg PO x1 again tonight Daily INR, CBC Monitor for s/sx bleeding, PO intake, new DDIs  Uvaldo Rising, BCPS  Clinical Pharmacist Pager [phone]  12/09/2016 1:40 PM

## 2016-12-09 NOTE — Progress Notes (Signed)
[facility] Progress Note   Subjective: no c/o's  Vitals:   12/09/16 0014 12/09/16 0444 12/09/16 0500 12/09/16 0818  BP: 106/62 111/63  (!) 109/54  Pulse: 95 (!) 103  (!) 106  Resp: (!) 24 (!) 21  (!) 22  Temp: 98.6 F (37 C) 98 F (36.7 C)  97.8 F (36.6 C)  TempSrc: Axillary Oral  Oral  SpO2: 95% 94%  95%  Weight:   98 kg (216 lb)   Height:        Inpatient medications: . allopurinol  100 mg Oral Daily  . amiodarone  200 mg Oral Daily  . atorvastatin  10 mg Oral QHS  . Chlorhexidine Gluconate Cloth  6 each Topical Q0600  . furosemide  40 mg Oral Once per day on Mon Wed Fri  . levothyroxine  50 mcg Oral QAC breakfast  . metoprolol succinate  25 mg Oral QHS  . mirtazapine  30 mg Oral QHS  . multivitamin  1 tablet Oral QPM  . mupirocin ointment  1 application Nasal BID  . sevelamer carbonate  800 mg Oral Q supper  . vitamin B-12  1,000 mcg Oral QPM  . Warfarin - Pharmacist Dosing Inpatient   Does not apply q1800   . sodium chloride 10 mL/hr (12/08/16 1152)  . diltiazem (CARDIZEM) infusion 15 mg/hr (12/09/16 0152)   acetaminophen, ondansetron **OR** ondansetron (ZOFRAN) IV, senna-docusate  Exam: Alert, joking around, hard to understand due to thick accent No jvd Chest clear bilat Irreg irreg no gallop Abd obese soft ntnd Ext trace LE edema NF, ox 3   CXR bibasilar atx, o/w no sig changes  Dialysis: [facility] MWF  EDW 216lb  98kg Hep none   3.5 hours      Assessment: 1. Afib with RVR: Per primary. 2. ESRD: for HD today off schedule 3. Volume: no sig vol overload on exam/ CXR.  2kg over dry wt today  4. HTN: cont MTP qhs, no chg, BP's good 5. Anemia: Hgb 11.9. No ESA needed yet. 6. Metabolic bone disease: Ca ok. Phos pending. Continue Renvela as binder. Will review records and add VDRA if needed. 7. Hx Prostate Ca: with obstructive symptoms, foley in place, +pyuria, urine cx pending. No fever/WBC , does not appear septic.  Pt has urology  following in OP setting for indwelling foley.    Plan - HD today off sched, HD tomorrow; UF as tolerated.    [name] MD [facility] pager [phone]   12/09/2016, 10:54 AM    Recent Labs Lab 12/08/16 1153 12/08/16 1209 12/08/16 1959 12/09/16 0150  NA 141 140 139 140  K 4.4 4.4 4.1 4.4  CL 103 102 102 102  CO2 26  --  25 26  GLUCOSE 106* 99 130* 97  BUN 26* 30* 29* 30*  CREATININE 9.08* 9.30* 9.51* 9.90*  CALCIUM 8.2*  --  8.3* 8.3*    Recent Labs Lab 12/08/16 1959 12/09/16 0150  AST 20 15  ALT 9* 6*  ALKPHOS 112 109  BILITOT 0.6 0.5  PROT 5.9* 5.5*  ALBUMIN 3.0* 2.8*    Recent Labs Lab 12/08/16 1153 12/08/16 1209 12/09/16 0150  WBC 6.0  --  5.8  HGB 11.3* 11.9* 10.9*  HCT 37.1* 35.0* 35.5*  MCV 95.9  --  95.2  PLT 156  --  157   Iron/TIBC/Ferritin/ %Sat    Component Value Date/Time   IRON 49 03/31/2011 2255   TIBC 276 03/31/2011 2255  FERRITIN 320 03/31/2011 2255   IRONPCTSAT 18 (L) 03/31/2011 2255

## 2016-12-09 NOTE — Progress Notes (Addendum)
PT Cancellation Note  Patient Details Name: Steven [name] MRN: [id number] DOB: 02-25-1927   Cancelled Treatment:    Reason Eval/Treat Not Completed: Patient at procedure or test/unavailable.   Gone to HD.   Will check back later as able. 12/09/2016  Donnella Sham, [place] [phone]  (pager)   Tessie Fass Jabrea Kallstrom 12/09/2016, 3:02 PM

## 2016-12-09 NOTE — Progress Notes (Signed)
PROGRESS NOTE    Steven [name]  NWG:[id number] DOB: 1927-03-17 DOA: 12/08/2016 PCP: Briscoe Deutscher, MD   Brief Narrative:   83 year old male with past medical history of hypertension, hyperlipidemia, ESRD, atrial fibrillation on Coumadin came to the ER with complaints of progressive shortness of breath after missing HD.  Assessment & Plan:   Active Problems:   Obesity (BMI 30-39.9)   Atrial fibrillation (HCC)   Hypertensive heart disease   Mitral valve disease   Long term current use of anticoagulant therapy   Hyperlipidemia   Shortness of breath   ESRD (end stage renal disease) (HCC)  Acute respiratory distress with hypoxia likely secondary to volume overload -He missed his dialysis session likely that's why he became hyper bulimic -Nephrology is following, dialysis per them -Continue Renvela -Monitor ins and outs  Atrial fibrillation with RVR -Currently rate controlled.  -Continue amiodarone and metoprolol. Coumadin per pharmacy -Echocardiogram ordered-results pending -Cardiology following-recommends possible cardioversion  End-stage renal disease on dialysis -Nephrology following -If issues with blood pressure during dialysis, try Midodrine  Chronic diastolic CHF  -Unlikely this is from acute exacerbation of his cardiac condition. It's likely from missing his dialysis  Anemia of chronic disease -Hemoglobin at baseline without any active signs of bleeding  Hypothyroidism Continue Synthroid  DVT prophylaxis: Coumadin Code Status: Full Family Communication:  None at bedside Disposition Plan: Monitor in stepdown today.  Consultants:   Cardiology  Nephrology  Procedures:   None  Antimicrobials:   None   Subjective: Patient does not any complaints this morning. States symptoms are slightly improved.  Objective: Vitals:   12/09/16 1245 12/09/16 1315 12/09/16 1345 12/09/16 1415  BP: (!) 108/52 (!) 111/57 113/61 101/60  Pulse: 99 (!) 101 (!) 111  (!) 104  Resp:      Temp:      TempSrc:      SpO2:      Weight:      Height:        Intake/Output Summary (Last 24 hours) at 12/09/16 1605 Last data filed at 12/09/16 0600  Gross per 24 hour  Intake              275 ml  Output                0 ml  Net              275 ml   Filed Weights   12/08/16 1123 12/09/16 0500 12/09/16 1145  Weight: 97.5 kg (215 lb) 98 kg (216 lb) 99.6 kg (219 lb 9.3 oz)    Examination:  General exam: Appears calm and comfortable  Respiratory system: Clear to auscultation. Respiratory effort normal. Cardiovascular system: Irregular rate and rhytmsS1 & S2 heard, RRR. No JVD, murmurs, rubs, gallops or clicks. 1-2+ b/l pittingedema. Gastrointestinal system: Abdomen is nondistended, soft and nontender. No organomegaly or masses felt. Normal bowel sounds heard. Central nervous system: Alert and oriented. No focal neurological deficits. Extremities: Symmetric 5 x 5 power. Skin: No rashes, lesions or ulcers Psychiatry: Judgement and insight appear normal. Mood & affect appropriate.     Data Reviewed:   CBC:  Recent Labs Lab 12/08/16 1153 12/08/16 1209 12/09/16 0150  WBC 6.0  --  5.8  HGB 11.3* 11.9* 10.9*  HCT 37.1* 35.0* 35.5*  MCV 95.9  --  95.2  PLT 156  --  578   Basic Metabolic Panel:  Recent Labs Lab 12/08/16 1153 12/08/16 1209 12/08/16 1959 12/09/16 0150 12/09/16 1225  NA  141 140 139 140 137  K 4.4 4.4 4.1 4.4 4.8  CL 103 102 102 102 101  CO2 26  --  25 26 25   GLUCOSE 106* 99 130* 97 95  BUN 26* 30* 29* 30* 32*  CREATININE 9.08* 9.30* 9.51* 9.90* 10.18*  CALCIUM 8.2*  --  8.3* 8.3* 8.2*  PHOS  --   --   --   --  3.3   GFR: Estimated Creatinine Clearance: 6 mL/min (A) (by C-G formula based on SCr of 10.18 mg/dL (H)). Liver Function Tests:  Recent Labs Lab 12/08/16 1959 12/09/16 0150 12/09/16 1225  AST 20 15  --   ALT 9* 6*  --   ALKPHOS 112 109  --   BILITOT 0.6 0.5  --   PROT 5.9* 5.5*  --   ALBUMIN 3.0* 2.8*  2.8*   No results for input(s): LIPASE, AMYLASE in the last 168 hours. No results for input(s): AMMONIA in the last 168 hours. Coagulation Profile:  Recent Labs Lab 12/08/16 1153 12/09/16 0150  INR 1.88 2.17   Cardiac Enzymes: No results for input(s): CKTOTAL, CKMB, CKMBINDEX, TROPONINI in the last 168 hours. BNP (last 3 results) No results for input(s): PROBNP in the last 8760 hours. HbA1C: No results for input(s): HGBA1C in the last 72 hours. CBG: No results for input(s): GLUCAP in the last 168 hours. Lipid Profile: No results for input(s): CHOL, HDL, LDLCALC, TRIG, CHOLHDL, LDLDIRECT in the last 72 hours. Thyroid Function Tests:  Recent Labs  12/08/16 1959  TSH 4.285   Anemia Panel: No results for input(s): VITAMINB12, FOLATE, FERRITIN, TIBC, IRON, RETICCTPCT in the last 72 hours. Sepsis Labs: No results for input(s): PROCALCITON, LATICACIDVEN in the last 168 hours.  Recent Results (from the past 240 hour(s))  MRSA PCR Screening     Status: Abnormal   Collection Time: 12/08/16  6:04 PM  Result Value Ref Range Status   MRSA by PCR POSITIVE (A) NEGATIVE Final    Comment:        The GeneXpert MRSA Assay (FDA approved for NASAL specimens only), is one component of a comprehensive MRSA colonization surveillance program. It is not intended to diagnose MRSA infection nor to guide or monitor treatment for MRSA infections. RESULT CALLED TO, READ BACK BY AND VERIFIED WITHRaliegh Ip [facility] RN 2006 12/08/16 A BROWNING          Radiology Studies: Dg Chest Port 1 View  Result Date: 12/08/2016 CLINICAL DATA:  Weakness, tachycardia, atrial fibrillation. EXAM: PORTABLE CHEST 1 VIEW COMPARISON:  09/27/2015. FINDINGS: Trachea is midline. Heart size is accentuated by AP technique and low lung volumes. Thoracic aorta is calcified. Bibasilar volume loss. Tiny left pleural effusion. No airspace consolidation. IMPRESSION: Bibasilar atelectasis with a tiny left pleural effusion.  Electronically Signed   By: Lorin Picket M.D.   On: 12/08/2016 12:47        Scheduled Meds: . allopurinol  100 mg Oral Daily  . amiodarone  200 mg Oral Daily  . atorvastatin  10 mg Oral QHS  . Chlorhexidine Gluconate Cloth  6 each Topical Q0600  . furosemide  40 mg Oral Once per day on Mon Wed Fri  . levothyroxine  50 mcg Oral QAC breakfast  . metoprolol succinate  25 mg Oral QHS  . mirtazapine  30 mg Oral QHS  . multivitamin  1 tablet Oral QPM  . mupirocin ointment  1 application Nasal BID  . sevelamer carbonate  800 mg Oral Q supper  .  vitamin B-12  1,000 mcg Oral QPM  . warfarin  3 mg Oral ONCE-1800  . Warfarin - Pharmacist Dosing Inpatient   Does not apply q1800   Continuous Infusions: . sodium chloride 10 mL/hr (12/08/16 1152)  . sodium chloride    . sodium chloride    . sodium chloride    . sodium chloride    . diltiazem (CARDIZEM) infusion 15 mg/hr (12/09/16 0152)     LOS: 1 day    Time spent: 35 mins     Glennie Rodda Arsenio Loader, MD [facility] Pager [phone]   If 7PM-7AM, please contact night-coverage [email or web address] Password [email or web address] 12/09/2016, 4:05 PM

## 2016-12-09 NOTE — Plan of Care (Signed)
Problem: Education: Goal: Knowledge of disease and its progression will improve Outcome: Progressing Patient understands the importance of attending dialysis 3 times per week. Patient knows his medications and what they do for his renal function.

## 2016-12-09 NOTE — Progress Notes (Signed)
OT Cancellation Note  Patient Details Name: Steven [name] MRN: [id number] DOB: 07/04/1927   Cancelled Treatment:    Reason Eval/Treat Not Completed: Patient at procedure or test/ unavailable.  WIll reattempt.  [place], OTR/L [phone]   Steven [name] M 12/09/2016, 1:14 PM

## 2016-12-09 NOTE — Plan of Care (Signed)
Problem: Safety: Goal: Ability to remain free from injury will improve Outcome: Progressing No incidence of falls during this admission. Call bell within reach. Bed in low and locked position. Patient alert and oriented. Clean and clear environment maintained. Nonskid footwear in place. 3/4 siderails being utilized. Patient verbalized understanding of safety instruction.

## 2016-12-10 ENCOUNTER — Inpatient Hospital Stay (HOSPITAL_COMMUNITY): Payer: Medicare Other

## 2016-12-10 DIAGNOSIS — I10 Essential (primary) hypertension: Secondary | ICD-10-CM

## 2016-12-10 DIAGNOSIS — J9601 Acute respiratory failure with hypoxia: Secondary | ICD-10-CM

## 2016-12-10 DIAGNOSIS — I4891 Unspecified atrial fibrillation: Secondary | ICD-10-CM

## 2016-12-10 DIAGNOSIS — Z992 Dependence on renal dialysis: Secondary | ICD-10-CM

## 2016-12-10 DIAGNOSIS — R0602 Shortness of breath: Secondary | ICD-10-CM

## 2016-12-10 DIAGNOSIS — N186 End stage renal disease: Secondary | ICD-10-CM

## 2016-12-10 DIAGNOSIS — R531 Weakness: Secondary | ICD-10-CM

## 2016-12-10 DIAGNOSIS — R0902 Hypoxemia: Secondary | ICD-10-CM

## 2016-12-10 DIAGNOSIS — I481 Persistent atrial fibrillation: Secondary | ICD-10-CM

## 2016-12-10 LAB — CBC
HEMATOCRIT: 35.9 % — AB (ref 39.0–52.0)
HEMOGLOBIN: 11.1 g/dL — AB (ref 13.0–17.0)
MCH: 29.1 pg (ref 26.0–34.0)
MCHC: 30.9 g/dL (ref 30.0–36.0)
MCV: 94.2 fL (ref 78.0–100.0)
Platelets: 147 10*3/uL — ABNORMAL LOW (ref 150–400)
RBC: 3.81 MIL/uL — ABNORMAL LOW (ref 4.22–5.81)
RDW: 17.2 % — ABNORMAL HIGH (ref 11.5–15.5)
WBC: 5.9 10*3/uL (ref 4.0–10.5)

## 2016-12-10 LAB — RENAL FUNCTION PANEL
ANION GAP: 11 (ref 5–15)
Albumin: 2.7 g/dL — ABNORMAL LOW (ref 3.5–5.0)
BUN: 11 mg/dL (ref 6–20)
CALCIUM: 7.9 mg/dL — AB (ref 8.9–10.3)
CHLORIDE: 95 mmol/L — AB (ref 101–111)
CO2: 27 mmol/L (ref 22–32)
Creatinine, Ser: 5.18 mg/dL — ABNORMAL HIGH (ref 0.61–1.24)
GFR calc non Af Amer: 9 mL/min — ABNORMAL LOW (ref >60)
GFR, EST AFRICAN AMERICAN: 10 mL/min — AB (ref >60)
Glucose, Bld: 87 mg/dL (ref 65–99)
Phosphorus: 2.3 mg/dL — ABNORMAL LOW (ref 2.5–4.6)
Potassium: 4.3 mmol/L (ref 3.5–5.1)
Sodium: 133 mmol/L — ABNORMAL LOW (ref 135–145)

## 2016-12-10 LAB — PROTIME-INR
INR: 2.23
Prothrombin Time: 25 seconds — ABNORMAL HIGH (ref 11.4–15.2)

## 2016-12-10 LAB — HEPATITIS B SURFACE ANTIGEN: Hepatitis B Surface Ag: NEGATIVE

## 2016-12-10 MED ORDER — SODIUM CHLORIDE 0.9 % IV SOLN: 100.0000 mL | INTRAVENOUS | Status: DC | PRN

## 2016-12-10 MED ORDER — WARFARIN SODIUM 2 MG PO TABS: 3.0000 mg | ORAL_TABLET | Freq: Once | ORAL | Status: DC

## 2016-12-10 MED ORDER — ALTEPLASE 2 MG IJ SOLR: 2.0000 mg | Freq: Once | INTRAMUSCULAR | Status: DC | PRN

## 2016-12-10 MED ORDER — DILTIAZEM HCL-DEXTROSE 100-5 MG/100ML-% IV SOLN (PREMIX)
5.0000 mg/h | INTRAVENOUS | Status: DC
  Administered 2016-12-10: 12.5 mg/h via INTRAVENOUS
  Filled 2016-12-10: qty 100

## 2016-12-10 MED ORDER — LIDOCAINE-PRILOCAINE 2.5-2.5 % EX CREA: 1.0000 "application " | TOPICAL_CREAM | CUTANEOUS | Status: DC | PRN

## 2016-12-10 MED ORDER — WARFARIN SODIUM 2 MG PO TABS
4.0000 mg | ORAL_TABLET | Freq: Once | ORAL | Status: AC
  Administered 2016-12-10: 4 mg via ORAL
  Filled 2016-12-10: qty 2

## 2016-12-10 MED ORDER — PENTAFLUOROPROP-TETRAFLUOROETH EX AERO: 1.0000 "application " | INHALATION_SPRAY | CUTANEOUS | Status: DC | PRN

## 2016-12-10 MED ORDER — LIDOCAINE HCL (PF) 1 % IJ SOLN: 5.0000 mL | INTRAMUSCULAR | Status: DC | PRN

## 2016-12-10 MED ORDER — DILTIAZEM HCL-DEXTROSE 100-5 MG/100ML-% IV SOLN (PREMIX): 5.0000 mg/h | INTRAVENOUS | Status: DC

## 2016-12-10 MED ORDER — HEPARIN SODIUM (PORCINE) 1000 UNIT/ML DIALYSIS: 1000.0000 [IU] | INTRAMUSCULAR | Status: DC | PRN

## 2016-12-10 NOTE — Progress Notes (Signed)
Hemodialysis: 15 mins into HD tx, BP dropped below 90 systolic.  UF off at this time.

## 2016-12-10 NOTE — Progress Notes (Signed)
[facility] Progress Note   Subjective: no c/o's  Vitals:   12/10/16 1045 12/10/16 1100 12/10/16 1130 12/10/16 1200  BP: (!) 83/58 (!) 99/57 (!) 91/48 103/62  Pulse: 90 92 93 98  Resp:      Temp:      TempSrc:      SpO2:      Weight:      Height:        Inpatient medications: . allopurinol  100 mg Oral Daily  . amiodarone  200 mg Oral Daily  . atorvastatin  10 mg Oral QHS  . Chlorhexidine Gluconate Cloth  6 each Topical Q0600  . furosemide  40 mg Oral Once per day on Mon Wed Fri  . levothyroxine  50 mcg Oral QAC breakfast  . metoprolol succinate  25 mg Oral QHS  . mirtazapine  30 mg Oral QHS  . multivitamin  1 tablet Oral QPM  . mupirocin ointment  1 application Nasal BID  . sevelamer carbonate  800 mg Oral Q supper  . vitamin B-12  1,000 mcg Oral QPM  . Warfarin - Pharmacist Dosing Inpatient   Does not apply q1800   . sodium chloride 10 mL/hr (12/08/16 1152)  . sodium chloride    . sodium chloride    . sodium chloride    . sodium chloride    . sodium chloride    . sodium chloride    . diltiazem (CARDIZEM) infusion 10 mg/hr (12/10/16 0850)   sodium chloride, sodium chloride, sodium chloride, sodium chloride, sodium chloride, sodium chloride, acetaminophen, alteplase, heparin, heparin, lidocaine (PF), lidocaine-prilocaine, ondansetron **OR** ondansetron (ZOFRAN) IV, pentafluoroprop-tetrafluoroeth, senna-docusate, technetium TC 51M diethylenetriame-pentaacetic acid  Exam: Alert, joking around, hard to understand due to thick accent No jvd Chest clear bilat Irreg irreg no gallop Abd obese soft ntnd Ext trace LE edema NF, ox 3   CXR bibasilar atx, o/w no sig changes  Dialysis: [facility] MWF  EDW 216lb  98kg Hep none   3.5 hours      Assessment: 1. Afib with RVR: Per primary. Possible cardioversion tomorrow.  2. ESRD: for HD today off schedule 3. Volume: no sig vol overload on exam/ CXR. Mild LE edema. Is under dry wt. BP's low today.  4. HTN:  cont MTP qhs, no chg, BP's good 5. Anemia: Hgb 11.9. No ESA needed yet. 6. Metabolic bone disease: Ca ok. Phos pending. Continue Renvela as binder. Will review records and add VDRA if needed. 7. Hx Prostate Ca: with obstructive symptoms, foley in place, +pyuria, urine cx pending. No fever/WBC , does not appear septic.  Pt has urology following in OP setting for indwelling foley.    Plan - HD today on schedule, lower vol as tolerated.    [name] MD [facility] pager [phone]   12/10/2016, 12:01 PM    Recent Labs Lab 12/08/16 1959 12/09/16 0150 12/09/16 1225  NA 139 140 137  K 4.1 4.4 4.8  CL 102 102 101  CO2 25 26 25   GLUCOSE 130* 97 95  BUN 29* 30* 32*  CREATININE 9.51* 9.90* 10.18*  CALCIUM 8.3* 8.3* 8.2*  PHOS  --   --  3.3    Recent Labs Lab 12/08/16 1959 12/09/16 0150 12/09/16 1225  AST 20 15  --   ALT 9* 6*  --   ALKPHOS 112 109  --   BILITOT 0.6 0.5  --   PROT 5.9* 5.5*  --   ALBUMIN 3.0* 2.8* 2.8*  Recent Labs Lab 12/08/16 1153 12/08/16 1209 12/09/16 0150 12/09/16 1758  WBC 6.0  --  5.8 6.5  HGB 11.3* 11.9* 10.9* 12.3*  HCT 37.1* 35.0* 35.5* 39.1  MCV 95.9  --  95.2 94.7  PLT 156  --  157 143*   Iron/TIBC/Ferritin/ %Sat    Component Value Date/Time   IRON 49 03/31/2011 2255   TIBC 276 03/31/2011 2255   FERRITIN 320 03/31/2011 2255   IRONPCTSAT 18 (L) 03/31/2011 2255

## 2016-12-10 NOTE — Progress Notes (Signed)
PROGRESS NOTE    Steven [name]  ZYS:[id number] DOB: 10/29/1926 DOA: 12/08/2016 PCP: Briscoe Deutscher, MD   Brief Narrative:   83 year old male with past medical history of hypertension, hyperlipidemia, ESRD, atrial fibrillation on Coumadin came to the ER with complaints of progressive shortness of breath after missing HD.  Assessment & Plan:   Active Problems:   Obesity (BMI 30-39.9)   Atrial fibrillation (HCC)   Hypertensive heart disease   Mitral valve disease   Long term current use of anticoagulant therapy   Hyperlipidemia   Shortness of breath   ESRD (end stage renal disease) (HCC)   Atrial fibrillation with rapid ventricular response (HCC)   ESRD on dialysis [facility])   Essential hypertension   Acute respiratory failure with hypoxia (HCC)   Generalized weakness  Acute respiratory distress with hypoxia likely secondary to volume overload; improved  -He missed his dialysis session likely that's why he became hyper bulimic -Nephrology is following, dialysis per them. Caution due to soft BP.  -Continue Renvela -Monitor ins and outs  Atrial fibrillation; persistent -Currently rate controlled.  -Continue amiodarone and metoprolol. Coumadin per pharmacy -Echocardiogram done- read pending.  -Cardiology following-recommends possible cardioversion. Possible tomorrow.   End-stage renal disease on dialysis -Nephrology following -If issues with blood pressure during dialysis, try Midodrine  Chronic diastolic CHF  -Unlikely this is from acute exacerbation of his cardiac condition. It's likely from missing his dialysis  Anemia of chronic disease -Hemoglobin at baseline without any active signs of bleeding  Hypothyroidism Continue Synthroid  MRSA positive per history therefore on contact precaution  DVT prophylaxis: Coumadin Code Status: Full Family Communication:  None at bedside Disposition Plan: Monitor in stepdown today.  Consultants:    Cardiology  Nephrology  Procedures:   None  Antimicrobials:   None   Subjective: Patient seen in dialysis today. He does not have any new complaints. Had a brief episode of hypotension this morning during his dialysis therefore his ultrafiltration was adjusted by nephrology.  Objective: Vitals:   12/10/16 1200 12/10/16 1230 12/10/16 1300 12/10/16 1330  BP: 103/62 110/64 (!) 94/50 110/60  Pulse: 98 97 (!) 107 97  Resp:      Temp:      TempSrc:      SpO2:      Weight:      Height:        Intake/Output Summary (Last 24 hours) at 12/10/16 1350 Last data filed at 12/10/16 0400  Gross per 24 hour  Intake              330 ml  Output             3000 ml  Net            -2670 ml   Filed Weights   12/09/16 1145 12/09/16 1600 12/10/16 1020  Weight: 99.6 kg (219 lb 9.3 oz) 96.7 kg (213 lb 3 oz) 96.5 kg (212 lb 11.9 oz)    Examination:  General exam: Appears calm and comfortable  Respiratory system: Mild by basilar crackles Cardiovascular system: Irregular rate and rhytmsS1 & S2 heard, RRR. No JVD, murmurs, rubs, gallops or clicks. 1-2+ b/l pittingedema. Gastrointestinal system: Abdomen is nondistended, soft and nontender. No organomegaly or masses felt. Normal bowel sounds heard. Central nervous system: Alert and oriented. No focal neurological deficits. Extremities: Symmetric 5 x 5 power. Skin: No rashes, lesions or ulcers Psychiatry: Judgement and insight appear normal. Mood & affect appropriate.     Data Reviewed:  CBC:  Recent Labs Lab 12/08/16 1153 12/08/16 1209 12/09/16 0150 12/09/16 1758 12/10/16 1158  WBC 6.0  --  5.8 6.5 5.9  HGB 11.3* 11.9* 10.9* 12.3* 11.1*  HCT 37.1* 35.0* 35.5* 39.1 35.9*  MCV 95.9  --  95.2 94.7 94.2  PLT 156  --  157 143* 297*   Basic Metabolic Panel:  Recent Labs Lab 12/08/16 1153 12/08/16 1209 12/08/16 1959 12/09/16 0150 12/09/16 1225 12/10/16 1158  NA 141 140 139 140 137 133*  K 4.4 4.4 4.1 4.4 4.8 4.3  CL  103 102 102 102 101 95*  CO2 26  --  25 26 25 27   GLUCOSE 106* 99 130* 97 95 87  BUN 26* 30* 29* 30* 32* 11  CREATININE 9.08* 9.30* 9.51* 9.90* 10.18* 5.18*  CALCIUM 8.2*  --  8.3* 8.3* 8.2* 7.9*  PHOS  --   --   --   --  3.3 2.3*   GFR: Estimated Creatinine Clearance: 11.7 mL/min (A) (by C-G formula based on SCr of 5.18 mg/dL (H)). Liver Function Tests:  Recent Labs Lab 12/08/16 1959 12/09/16 0150 12/09/16 1225 12/10/16 1158  AST 20 15  --   --   ALT 9* 6*  --   --   ALKPHOS 112 109  --   --   BILITOT 0.6 0.5  --   --   PROT 5.9* 5.5*  --   --   ALBUMIN 3.0* 2.8* 2.8* 2.7*   No results for input(s): LIPASE, AMYLASE in the last 168 hours. No results for input(s): AMMONIA in the last 168 hours. Coagulation Profile:  Recent Labs Lab 12/08/16 1153 12/09/16 0150 12/10/16 0926  INR 1.88 2.17 2.23   Cardiac Enzymes: No results for input(s): CKTOTAL, CKMB, CKMBINDEX, TROPONINI in the last 168 hours. BNP (last 3 results) No results for input(s): PROBNP in the last 8760 hours. HbA1C: No results for input(s): HGBA1C in the last 72 hours. CBG: No results for input(s): GLUCAP in the last 168 hours. Lipid Profile: No results for input(s): CHOL, HDL, LDLCALC, TRIG, CHOLHDL, LDLDIRECT in the last 72 hours. Thyroid Function Tests:  Recent Labs  12/08/16 1959  TSH 4.285   Anemia Panel: No results for input(s): VITAMINB12, FOLATE, FERRITIN, TIBC, IRON, RETICCTPCT in the last 72 hours. Sepsis Labs: No results for input(s): PROCALCITON, LATICACIDVEN in the last 168 hours.  Recent Results (from the past 240 hour(s))  MRSA PCR Screening     Status: Abnormal   Collection Time: 12/08/16  6:04 PM  Result Value Ref Range Status   MRSA by PCR POSITIVE (A) NEGATIVE Final    Comment:        The GeneXpert MRSA Assay (FDA approved for NASAL specimens only), is one component of a comprehensive MRSA colonization surveillance program. It is not intended to diagnose MRSA infection  nor to guide or monitor treatment for MRSA infections. RESULT CALLED TO, READ BACK BY AND VERIFIED WITHRaliegh Ip [facility] RN 2006 12/08/16 A BROWNING          Radiology Studies: X-ray Chest Pa And Lateral  Result Date: 12/10/2016 CLINICAL DATA:  Shortness of breath. EXAM: CHEST  2 VIEW COMPARISON:  12/08/2016. FINDINGS: Cardiomegaly. Mild increase in basilar interstitial prominence. Mild CHF cannot be excluded. Low lung volumes with mild basilar atelectasis. Mild right base infiltrate cannot be excluded. No pneumothorax. IMPRESSION: 1. Cardiomegaly with mild increase in basilar interstitial prominence. Mild CHF cannot be excluded. 2. Low lung volumes with basilar atelectasis. Mild infiltrate right  lung base cannot be excluded . Electronically Signed   By: Marcello Moores  Register   On: 12/10/2016 07:27        Scheduled Meds: . allopurinol  100 mg Oral Daily  . amiodarone  200 mg Oral Daily  . atorvastatin  10 mg Oral QHS  . Chlorhexidine Gluconate Cloth  6 each Topical Q0600  . furosemide  40 mg Oral Once per day on Mon Wed Fri  . levothyroxine  50 mcg Oral QAC breakfast  . metoprolol succinate  25 mg Oral QHS  . mirtazapine  30 mg Oral QHS  . multivitamin  1 tablet Oral QPM  . mupirocin ointment  1 application Nasal BID  . sevelamer carbonate  800 mg Oral Q supper  . vitamin B-12  1,000 mcg Oral QPM  . Warfarin - Pharmacist Dosing Inpatient   Does not apply q1800   Continuous Infusions: . sodium chloride 10 mL/hr (12/08/16 1152)  . sodium chloride    . sodium chloride    . sodium chloride    . sodium chloride    . sodium chloride    . sodium chloride    . diltiazem (CARDIZEM) infusion 10 mg/hr (12/10/16 0850)     LOS: 2 days    Time spent: 35 mins     Naithen Rivenburg Arsenio Loader, MD [facility] Pager [phone]   If 7PM-7AM, please contact night-coverage [email or web address] Password [email or web address] 12/10/2016, 1:50 PM

## 2016-12-10 NOTE — Progress Notes (Signed)
OT Cancellation Note  Patient Details Name: Steven [name] MRN: [id number] DOB: 1926/10/06   Cancelled Treatment:    Reason Eval/Treat Not Completed: Patient at procedure or test/ unavailable. Pt at HD at this time. Will check back as able for OT evaluation.  Norman Herrlich, MS OTR/L  Pager: [phone]   Norman Herrlich 12/10/2016, 12:25 PM

## 2016-12-10 NOTE — Progress Notes (Signed)
Informed Dr. Wynonia Lawman that patients BP is 83/48 so Cardizem drip was stopped. Informed MD HR is 90-100's. Per MD keep drip off and continue to monitor patient.

## 2016-12-10 NOTE — Progress Notes (Signed)
[facility] War[facility]in  Indication: atrial fibrillation  Allergies  Allergen Reactions  . Hydrocodone Other (See Comments)    Reaction:  Confusion   . Penicillins Hives and Other (See Comments)    Has patient had a PCN reaction causing immediate rash, facial/tongue/throat swelling, SOB or lightheadedness with hypotension: No Has patient had a PCN reaction causing severe rash involving mucus membranes or skin necrosis: No Has patient had a PCN reaction that required hospitalization No Has patient had a PCN reaction occurring within the last 10 years: No If all of the above answers are "NO", then may proceed with Cephalosporin use.    Patient Measurements: Height: 5\' 11"  (180.3 cm) Weight: 212 lb 11.9 oz (96.5 kg) IBW/kg (Calculated) : 75.3  Vital Signs: Temp: 97.7 F (36.5 C) (05/16 1020) Temp Source: Oral (05/16 1020) BP: 110/60 (05/16 1330) Pulse Rate: 97 (05/16 1330)  Labs:  Recent Labs  12/08/16 1153  12/09/16 0150 12/09/16 1225 12/09/16 1758 12/10/16 0926 12/10/16 1158  HGB 11.3*  < > 10.9*  --  12.3*  --  11.1*  HCT 37.1*  < > 35.5*  --  39.1  --  35.9*  PLT 156  --  157  --  143*  --  147*  LABPROT 21.9*  --  24.6*  --   --  25.0*  --   INR 1.88  --  2.17  --   --  2.23  --   CREATININE 9.08*  < > 9.90* 10.18*  --   --  5.18*  < > = values in this interval not displayed.  Estimated Creatinine Clearance: 11.7 mL/min (A) (by C-G formula based on SCr of 5.18 mg/dL (H)).   Assessment: 83 yo male admitted with acute respiratory failure, on warfarin PTA for afib. Per note, DVT/PE is a possibility, V/Q scan ordered- do not yet see results. Awaiting cardioversion.  INR now up to 2.23.   -Hgb low but stable, plts 147-no bleeding noted -No new DDIs noted since admission -Diet ordered  PTA warfarin dose 2 mg PO daily  Goal of Therapy:  INR 2-3 Monitor platelets by anticoagulation protocol: Yes   Plan:  Warfarin 3mg  PO  x1 again tonight Daily INR, CBC Monitor for s/sx bleeding, PO intake, new DDIs   Devani Odonnel D. Hayle Parisi, PharmD, BCPS Clinical Pharmacist Pager: [phone] Clinical Phone for 12/10/2016 until 3:30pm: [phone] If after 3:30pm, please call main pharmacy at [phone] 12/10/2016 2:10 PM

## 2016-12-10 NOTE — Progress Notes (Signed)
Dr. Wynonia Lawman at bedside; notified MD of soft BP's on diltiazem gtt at current rate of 71ml/hr (titrated down from 93ml/hr since start of shift).  MD verbalized OK with current gtt rate/SBP low-mid 90's (see flowsheet for details).  Will continue to monitor.

## 2016-12-10 NOTE — Progress Notes (Signed)
Arrived back to unit from dialysis.  Report received via telephone prior to arriving back to unit.  Family/friends present at bedside.  Denies any pain or discomfort.  VSS.  Report given to [place], Therapist, sports, & pt left in her care at this time.

## 2016-12-10 NOTE — Progress Notes (Signed)
Subjective:  No complaints of shortness of breath or chest pain. BP somewhat soft  Objective:  Vital Signs in the last 24 hours: BP 100/67 (BP Location: Right Arm)   Pulse 89   Temp 98.1 F (36.7 C) (Oral)   Resp 19   Ht 5\' 11"  (1.803 m)   Wt 96.7 kg (213 lb 3 oz)   SpO2 96%   BMI 29.73 kg/m   Physical Exam: Friendly white male in no acute distress Lungs:  Clear  Cardiac:  Irregular rhythm, normal S1 and S2, no S3, 1 to 2/6 systolic murmur Abdomen:  Soft, nontender, no masses Extremities: 2+ edema noted   Intake/Output from previous day: 05/15 0701 - 05/16 0700 In: 330 [I.V.:330] Out: 3000  Weight Filed Weights   12/09/16 0500 12/09/16 1145 12/09/16 1600  Weight: 98 kg (216 lb) 99.6 kg (219 lb 9.3 oz) 96.7 kg (213 lb 3 oz)    Lab Results: Basic Metabolic Panel:  Recent Labs  12/09/16 0150 12/09/16 1225  NA 140 137  K 4.4 4.8  CL 102 101  CO2 26 25  GLUCOSE 97 95  BUN 30* 32*  CREATININE 9.90* 10.18*    CBC:  Recent Labs  12/09/16 0150 12/09/16 1758  WBC 5.8 6.5  HGB 10.9* 12.3*  HCT 35.5* 39.1  MCV 95.2 94.7  PLT 157 143*    BNP    Component Value Date/Time   BNP 777.6 (H) 12/08/2016 1153    PROTIME: Lab Results  Component Value Date   INR 2.17 12/09/2016   INR 1.88 12/08/2016   INR 2.73 (H) 06/14/2013    Telemetry: Atrial fibrillation with mildly rapid response  Assessment/Plan:  1.  Atrial fibrillation persistent for at least a year 2.  Chronic diastolic heart failure with volume overload due to no dialysis in 3 days 3.  End-stage renal disease on dialysis 4.  Subtherapeutic anticoagulation  Recommendations:  Awaiting TTE today.  Tolerated dialysis well.  Have asked for TEE cardioversion tomorrow post dialysis today.  Spoke with patient and he is agreeable.      Kerry Hough  MD [facility]  12/10/2016, 9:17 AM

## 2016-12-10 NOTE — Progress Notes (Signed)
Nursing report given to Verdene Lennert, RN, via telephone.  RN notified of current Dilt gtt rate/soft BP's.  Also notified Verbal from Dr. Wynonia Lawman to hold scheduled PO Lasix prior to dialysis.  Pt off unit to dialysis in stable condition @ approx. 10:10am.

## 2016-12-10 NOTE — Progress Notes (Addendum)
PT Cancellation Note  Patient Details Name: Steven [name] MRN: [id number] DOB: 25-Apr-1927   Cancelled Treatment:    Reason Eval/Treat Not Completed: Patient at procedure or test/unavailable pt off floor at HD. Will follow up as time allows.  Attempted second time and pt with soft BP in 80s/40 below target of 90 systolic. RN to call Cardiology.   [place] 12/10/2016, 1:41 PM Wray Kearns, [place], DPT [phone]

## 2016-12-11 ENCOUNTER — Encounter (HOSPITAL_COMMUNITY)
Admission: EM | Disposition: A | Discharge status: Home Health | Payer: Self-pay | Source: Home / Self Care | Attending: Internal Medicine

## 2016-12-11 ENCOUNTER — Inpatient Hospital Stay (HOSPITAL_COMMUNITY): Payer: Medicare Other

## 2016-12-11 ENCOUNTER — Inpatient Hospital Stay (HOSPITAL_COMMUNITY): Payer: Medicare Other | Admitting: Anesthesiology

## 2016-12-11 ENCOUNTER — Encounter (HOSPITAL_COMMUNITY): Payer: Self-pay

## 2016-12-11 DIAGNOSIS — I351 Nonrheumatic aortic (valve) insufficiency: Secondary | ICD-10-CM

## 2016-12-11 DIAGNOSIS — I4891 Unspecified atrial fibrillation: Secondary | ICD-10-CM

## 2016-12-11 DIAGNOSIS — I10 Essential (primary) hypertension: Secondary | ICD-10-CM

## 2016-12-11 HISTORY — PX: CARDIOVERSION: SHX1299

## 2016-12-11 HISTORY — PX: TEE WITHOUT CARDIOVERSION: SHX5443

## 2016-12-11 LAB — ECHO TEE
AOVTI: 29.09 cm
AV Area mean vel: 1.93 cm2
AV Peak grad: 10 mmHg
AV area mean vel ind: 0.88 cm2/m2
AV peak Index: 0.8
AV vel: 1.76
AVAREAVTI: 1.76 cm2
AVAREAVTIIND: 0.8 cm2/m2
AVCELMEANRAT: 0.43
AVG: 5 mmHg
AVPKVEL: 160.46 cm/s
Ao pk vel: 0.39 m/s
DOP CAL AO MEAN VELOCITY: 102 cm/s
LVOT VTI: 11.2 cm
LVOT area: 4.52 cm2
LVOT diameter: 24 mm
LVOT peak grad rest: 2 mmHg
LVOTPV: 62.5 cm/s
LVOTSV: 51 mL
LVOTVTI: 0.39 cm
Valve area index: 0.8
Valve area: 1.76 cm2

## 2016-12-11 LAB — HEPATITIS B CORE ANTIBODY, TOTAL: Hep B Core Total Ab: NEGATIVE

## 2016-12-11 LAB — GLUCOSE, CAPILLARY: Glucose-Capillary: 109 mg/dL — ABNORMAL HIGH (ref 65–99)

## 2016-12-11 LAB — HEPATITIS B SURFACE ANTIBODY,QUALITATIVE: HEP B S AB: NONREACTIVE

## 2016-12-11 LAB — PROTIME-INR
INR: 2.35
Prothrombin Time: 26.1 seconds — ABNORMAL HIGH (ref 11.4–15.2)

## 2016-12-11 SURGERY — ECHOCARDIOGRAM, TRANSESOPHAGEAL
Anesthesia: General

## 2016-12-11 MED ORDER — PROPOFOL 500 MG/50ML IV EMUL
INTRAVENOUS | Status: DC | PRN
  Administered 2016-12-11: 125 ug/kg/min via INTRAVENOUS

## 2016-12-11 MED ORDER — BUTAMBEN-TETRACAINE-BENZOCAINE 2-2-14 % EX AERO
INHALATION_SPRAY | CUTANEOUS | Status: DC | PRN
  Administered 2016-12-11: 2 via TOPICAL

## 2016-12-11 MED ORDER — WARFARIN SODIUM 2 MG PO TABS
4.0000 mg | ORAL_TABLET | Freq: Once | ORAL | Status: AC
  Administered 2016-12-11: 4 mg via ORAL
  Filled 2016-12-11: qty 2

## 2016-12-11 MED ORDER — DILTIAZEM HCL-DEXTROSE 100-5 MG/100ML-% IV SOLN (PREMIX): 5.0000 mg/h | INTRAVENOUS | Status: DC

## 2016-12-11 MED ORDER — PHENYLEPHRINE HCL 10 MG/ML IJ SOLN
INTRAMUSCULAR | Status: DC | PRN
  Administered 2016-12-11: 120 ug via INTRAVENOUS
  Administered 2016-12-11: 80 ug via INTRAVENOUS
  Administered 2016-12-11: 120 ug via INTRAVENOUS

## 2016-12-11 NOTE — Interval H&P Note (Signed)
History and Physical Interval Note:  12/11/2016 10:03 AM  Steven [name]  has presented today for surgery, with the diagnosis of A-FIB  The various methods of treatment have been discussed with the patient and family. After consideration of risks, benefits and other options for treatment, the patient has consented to  Procedure(s): TRANSESOPHAGEAL ECHOCARDIOGRAM (TEE) (N/A) CARDIOVERSION (N/A) as a surgical intervention .  The patient's history has been reviewed, patient examined, no change in status, stable for surgery.  I have reviewed the patient's chart and labs.  Questions were answered to the patient's satisfaction.     Jenkins Rouge

## 2016-12-11 NOTE — H&P (View-Only) (Signed)
Subjective:  S75me hypotension yesterday on dialysis as well as after. Cardiazem d/c yest.   Objective:  Vital Signs in the last 24 hours: BP 98/65 (BP Location: Right Arm)   Pulse 98   Temp 98.6 F (37 C) (Oral)   Resp (!) 22   Ht 5\' 11"  (1.803 m)   Wt 94.3 kg (207 lb 14.3 oz)   SpO2 94%   BMI 29.00 kg/m   Physical Exam: Friendly white male in no acute distress Lungs:  Clear  Cardiac:  Irregular rhythm, normal S1 and S2, no S3, 1 to 2/6 systolic murmur Abdomen:  Soft, nontender, no masses Extremities: 1+ edema noted   Intake/Output from previous day: 05/16 0701 - 05/17 0700 In: 389 [P.O.:300; I.V.:89] Out: 1920  Weight Filed Weights   12/09/16 1600 12/10/16 1020 12/10/16 1412  Weight: 96.7 kg (213 lb 3 oz) 96.5 kg (212 lb 11.9 oz) 94.3 kg (207 lb 14.3 oz)    Lab Results: Basic Metabolic Panel:  Recent Labs  12/09/16 1225 12/10/16 1158  NA 137 133*  K 4.8 4.3  CL 101 95*  CO2 25 27  GLUCOSE 95 87  BUN 32* 11  CREATININE 10.18* 5.18*    CBC:  Recent Labs  12/09/16 1758 12/10/16 1158  WBC 6.5 5.9  HGB 12.3* 11.1*  HCT 39.1 35.9*  MCV 94.7 94.2  PLT 143* 147*    BNP    Component Value Date/Time   BNP 777.6 (H) 12/08/2016 1153    PROTIME: Lab Results  Component Value Date   INR 2.35 12/11/2016   INR 2.23 12/10/2016   INR 2.17 12/09/2016    Telemetry: Atrial fibrillation with mildly rapid response  Assessment/Plan:  1.  Atrial fibrillation persistent for at least a year 2.  Chronic diastolic heart failure with volume overload better 3.  End-stage renal disease on dialysis 4.  Subtherapeutic anticoagulation on admission, now therapeutic  Recommendations:  Awaiting TEE today.  Hopefully can restore sinus rhythm as may help BP and help control rate.  He has been on amiodarone for about a year now. Was palanning cardiovsersion last summer but patient did not come back to office.    Kerry Hough  MD  [facility]  12/11/2016, 9:01 AM

## 2016-12-11 NOTE — Progress Notes (Signed)
PROGRESS NOTE    Steven [name]  UJW:[id number] DOB: [birth year] DOA: 12/08/2016 PCP: Briscoe Deutscher, MD   Brief Narrative:   83 year old male with past medical history of hypertension, hyperlipidemia, ESRD, atrial fibrillation on Coumadin came to the ER with complaints of progressive shortness of breath after missing HD. Because of atrial fibrillation which has been persistently underwent TEE cardioversion today.  Assessment & Plan:   Active Problems:   Obesity (BMI 30-39.9)   Atrial fibrillation (HCC)   Hypertensive heart disease   Mitral valve disease   Long term current use of anticoagulant therapy   Hyperlipidemia   Shortness of breath   ESRD (end stage renal disease) (HCC)   Atrial fibrillation with rapid ventricular response (HCC)   ESRD on dialysis [facility])   Essential hypertension   Acute respiratory failure with hypoxia (HCC)   Generalized weakness  Acute respiratory distress with hypoxia likely secondary to volume overload; improved  -Nephrology is following, dialysis per them. Caution due to soft BP; Cardizem discontinued. Hold blood pressure medicine prior to dialysis. -Continue Renvela -Monitor ins and outs  Atrial fibrillation; persistent -Currently rate controlled.  -Continue amiodarone and metoprolol. Coumadin per pharmacy -Echocardiogram done- read pending.  -TEE cardioversion today. Performed but results are still pending. -Monitor electrolytes closely  End-stage renal disease on dialysis -Nephrology following -If issues with blood pressure during dialysis, try Midodrine  Chronic diastolic CHF  -Unlikely this is from acute exacerbation of his cardiac condition. It's likely from missing his dialysis  Anemia of chronic disease -Hemoglobin at baseline without any active signs of bleeding  Hypothyroidism Continue Synthroid  MRSA positive per history therefore on contact precaution  DVT prophylaxis: Coumadin Code Status: Full Family Communication:   None at bedside Disposition Plan: Likely discharge in next 48 hours. Transfer to tele if TEE cardioversion goes well.   Consultants:   Cardiology  Nephrology  Procedures:   None  Antimicrobials:   None   Subjective: No complaints this morning. Brief episode of hypotension during HD yesterday.   Objective: Vitals:   12/11/16 1010 12/11/16 1020 12/11/16 1030 12/11/16 1202  BP: (!) 105/39 (!) 112/43 (!) 116/43 (!) 141/58  Pulse: 69 62 63 78  Resp: 20 (!) 21 (!) 25 20  Temp: 97.8 F (36.6 C)   97.4 F (36.3 C)  TempSrc: Oral   Oral  SpO2: 100% 98% 96% 96%  Weight:      Height:        Intake/Output Summary (Last 24 hours) at 12/11/16 1310 Last data filed at 12/11/16 1205  Gross per 24 hour  Intake           918.96 ml  Output             1920 ml  Net         -1001.04 ml   Filed Weights   12/09/16 1600 12/10/16 1020 12/10/16 1412  Weight: 96.7 kg (213 lb 3 oz) 96.5 kg (212 lb 11.9 oz) 94.3 kg (207 lb 14.3 oz)    Examination:  General exam: Appears calm and comfortable  Respiratory system: Mild by basilar crackles Cardiovascular system: Irregular rate and rhytmsS1 & S2 heard, RRR. No JVD, murmurs, rubs, gallops or clicks. 1-2+ b/l pittingedema. Gastrointestinal system: Abdomen is nondistended, soft and nontender. No organomegaly or masses felt. Normal bowel sounds heard. Central nervous system: Alert and oriented. No focal neurological deficits. Extremities: Symmetric 5 x 5 power. Skin: No rashes, lesions or ulcers Psychiatry: Judgement and insight appear normal. Mood &  affect appropriate.     Data Reviewed:   CBC:  Recent Labs Lab 12/08/16 1153 12/08/16 1209 12/09/16 0150 12/09/16 1758 12/10/16 1158  WBC 6.0  --  5.8 6.5 5.9  HGB 11.3* 11.9* 10.9* 12.3* 11.1*  HCT 37.1* 35.0* 35.5* 39.1 35.9*  MCV 95.9  --  95.2 94.7 94.2  PLT 156  --  157 143* 509*   Basic Metabolic Panel:  Recent Labs Lab 12/08/16 1153 12/08/16 1209 12/08/16 1959  12/09/16 0150 12/09/16 1225 12/10/16 1158  NA 141 140 139 140 137 133*  K 4.4 4.4 4.1 4.4 4.8 4.3  CL 103 102 102 102 101 95*  CO2 26  --  25 26 25 27   GLUCOSE 106* 99 130* 97 95 87  BUN 26* 30* 29* 30* 32* 11  CREATININE 9.08* 9.30* 9.51* 9.90* 10.18* 5.18*  CALCIUM 8.2*  --  8.3* 8.3* 8.2* 7.9*  PHOS  --   --   --   --  3.3 2.3*   GFR: Estimated Creatinine Clearance: 11.6 mL/min (A) (by C-G formula based on SCr of 5.18 mg/dL (H)). Liver Function Tests:  Recent Labs Lab 12/08/16 1959 12/09/16 0150 12/09/16 1225 12/10/16 1158  AST 20 15  --   --   ALT 9* 6*  --   --   ALKPHOS 112 109  --   --   BILITOT 0.6 0.5  --   --   PROT 5.9* 5.5*  --   --   ALBUMIN 3.0* 2.8* 2.8* 2.7*   No results for input(s): LIPASE, AMYLASE in the last 168 hours. No results for input(s): AMMONIA in the last 168 hours. Coagulation Profile:  Recent Labs Lab 12/08/16 1153 12/09/16 0150 12/10/16 0926 12/11/16 0251  INR 1.88 2.17 2.23 2.35   Cardiac Enzymes: No results for input(s): CKTOTAL, CKMB, CKMBINDEX, TROPONINI in the last 168 hours. BNP (last 3 results) No results for input(s): PROBNP in the last 8760 hours. HbA1C: No results for input(s): HGBA1C in the last 72 hours. CBG: No results for input(s): GLUCAP in the last 168 hours. Lipid Profile: No results for input(s): CHOL, HDL, LDLCALC, TRIG, CHOLHDL, LDLDIRECT in the last 72 hours. Thyroid Function Tests:  Recent Labs  12/08/16 1959  TSH 4.285   Anemia Panel: No results for input(s): VITAMINB12, FOLATE, FERRITIN, TIBC, IRON, RETICCTPCT in the last 72 hours. Sepsis Labs: No results for input(s): PROCALCITON, LATICACIDVEN in the last 168 hours.  Recent Results (from the past 240 hour(s))  MRSA PCR Screening     Status: Abnormal   Collection Time: 12/08/16  6:04 PM  Result Value Ref Range Status   MRSA by PCR POSITIVE (A) NEGATIVE Final    Comment:        The GeneXpert MRSA Assay (FDA approved for NASAL  specimens only), is one component of a comprehensive MRSA colonization surveillance program. It is not intended to diagnose MRSA infection nor to guide or monitor treatment for MRSA infections. RESULT CALLED TO, READ BACK BY AND VERIFIED WITHRaliegh Ip [facility] RN 2006 12/08/16 A BROWNING          Radiology Studies: X-ray Chest Pa And Lateral  Result Date: 12/10/2016 CLINICAL DATA:  Shortness of breath. EXAM: CHEST  2 VIEW COMPARISON:  12/08/2016. FINDINGS: Cardiomegaly. Mild increase in basilar interstitial prominence. Mild CHF cannot be excluded. Low lung volumes with mild basilar atelectasis. Mild right base infiltrate cannot be excluded. No pneumothorax. IMPRESSION: 1. Cardiomegaly with mild increase in basilar interstitial prominence. Mild CHF  cannot be excluded. 2. Low lung volumes with basilar atelectasis. Mild infiltrate right lung base cannot be excluded . Electronically Signed   By: Marcello Moores  Register   On: 12/10/2016 07:27        Scheduled Meds: . allopurinol  100 mg Oral Daily  . amiodarone  200 mg Oral Daily  . atorvastatin  10 mg Oral QHS  . Chlorhexidine Gluconate Cloth  6 each Topical Q0600  . furosemide  40 mg Oral Once per day on Mon Wed Fri  . levothyroxine  50 mcg Oral QAC breakfast  . metoprolol succinate  25 mg Oral QHS  . mirtazapine  30 mg Oral QHS  . multivitamin  1 tablet Oral QPM  . mupirocin ointment  1 application Nasal BID  . sevelamer carbonate  800 mg Oral Q supper  . vitamin B-12  1,000 mcg Oral QPM  . warfarin  4 mg Oral ONCE-1800  . Warfarin - Pharmacist Dosing Inpatient   Does not apply q1800   Continuous Infusions: . sodium chloride 10 mL/hr (12/08/16 1152)  . diltiazem (CARDIZEM) infusion Stopped (12/10/16 1628)     LOS: 3 days    Time spent: 35 mins     Annis Lagoy Arsenio Loader, MD [facility] Pager [phone]   If 7PM-7AM, please contact night-coverage [email or web address] Password [email or web address] 12/11/2016, 1:10 PM

## 2016-12-11 NOTE — Progress Notes (Signed)
PT Cancellation Note  Patient Details Name: Steven [name] MRN: [id number] DOB: 1926/12/09   Cancelled Treatment:    Reason Eval/Treat Not Completed: Patient at procedure or test/unavailable Pt off floor at [facility]. Will follow up as time allows.   Marguarite Arbour A Cataleah Stites 12/11/2016, 9:25 AM Wray Kearns, [place], DPT [phone]

## 2016-12-11 NOTE — Progress Notes (Signed)
OT Cancellation Note  Patient Details Name: Steven [name] MRN: [id number] DOB: [birth year]   Cancelled Treatment:    Reason Eval/Treat Not Completed: Patient at procedure or test/ unavailable.  Will reattempt.  [place], OTR/L [phone]   Lucille Passy M 12/11/2016, 10:16 AM

## 2016-12-11 NOTE — Transfer of Care (Addendum)
Immediate Anesthesia Transfer of Care Note  Patient: Steven [name]  Procedure(s) Performed: Procedure(s): TRANSESOPHAGEAL ECHOCARDIOGRAM (TEE) (N/A) CARDIOVERSION (N/A)  Patient Location: PACU  Anesthesia Type:Gen  Level of Consciousness: patient cooperative and responds to stimulation  Airway & Oxygen Therapy: Patient Spontanous Breathing and Patient connected to nasal cannula oxygen  Post-op Assessment: Report given to RN and Post -op Vital signs reviewed and stable  Post vital signs: Reviewed and stable  Last Vitals:  Vitals:   12/11/16 0913 12/11/16 1010  BP: 101/64 (!) 105/39  Pulse: (!) 105 69  Resp: (!) 24 20  Temp: 36.6 C     Last Pain:  Vitals:   12/11/16 1010  TempSrc: Oral  PainSc:          Complications: No apparent anesthesia complications

## 2016-12-11 NOTE — Anesthesia Preprocedure Evaluation (Addendum)
Anesthesia Evaluation  Patient identified by MRN, date of birth, ID band Patient awake    Reviewed: Allergy & Precautions, NPO status , Patient's Chart, lab work & pertinent test results  History of Anesthesia Complications Negative for: history of anesthetic complications  Airway Mallampati: II   Neck ROM: Limited    Dental no notable dental hx. (+) Dental Advisory Given   Pulmonary neg pulmonary ROS,    Pulmonary exam normal        Cardiovascular hypertension, Normal cardiovascular exam  Study Conclusions  - Left ventricle: The cavity size was normal. Wall thickness was   increased in a pattern of moderate LVH. Systolic function was   normal. The estimated ejection fraction was in the range of 50%   to 55%. Wall motion was normal; there were no regional wall   motion abnormalities.   Neuro/Psych negative neurological ROS  negative psych ROS   GI/Hepatic negative GI ROS, Neg liver ROS,   Endo/Other  negative endocrine ROS  Renal/GU Renal Insufficiency, ESRF and DialysisRenal disease     Musculoskeletal   Abdominal   Peds  Hematology  (+) anemia ,   Anesthesia Other Findings   Reproductive/Obstetrics                            Anesthesia Physical Anesthesia Plan  ASA: III  Anesthesia Plan: General   Post-op Pain Management:    Induction:   Airway Management Planned: Natural Airway and Mask  Additional Equipment:   Intra-op Plan:   Post-operative Plan:   Informed Consent: I have reviewed the patients History and Physical, chart, labs and discussed the procedure including the risks, benefits and alternatives for the proposed anesthesia with the patient or authorized representative who has indicated his/her understanding and acceptance.   Dental advisory given  Plan Discussed with: CRNA and Anesthesiologist  Anesthesia Plan Comments:        Anesthesia Quick  Evaluation

## 2016-12-11 NOTE — Progress Notes (Signed)
Report called to RN on 6E. Pt transferred to [place] via wheelchair on tele monitoring in the company of NT.

## 2016-12-11 NOTE — Progress Notes (Addendum)
[place] for Warfarin  Indication: atrial fibrillation  Allergies  Allergen Reactions  . Hydrocodone Other (See Comments)    Reaction:  Confusion   . Penicillins Hives and Other (See Comments)    Has patient had a PCN reaction causing immediate rash, facial/tongue/throat swelling, SOB or lightheadedness with hypotension: No Has patient had a PCN reaction causing severe rash involving mucus membranes or skin necrosis: No Has patient had a PCN reaction that required hospitalization No Has patient had a PCN reaction occurring within the last 10 years: No If all of the above answers are "NO", then may proceed with Cephalosporin use.    Patient Measurements: Height: 5\' 11"  (180.3 cm) Weight: 207 lb 14.3 oz (94.3 kg) IBW/kg (Calculated) : 75.3  Vital Signs: Temp: 97.8 F (36.6 C) (05/17 1010) Temp Source: Oral (05/17 1010) BP: 116/43 (05/17 1030) Pulse Rate: 63 (05/17 1030)  Labs:  Recent Labs  12/09/16 0150 12/09/16 1225 12/09/16 1758 12/10/16 0926 12/10/16 1158 12/11/16 0251  HGB 10.9*  --  12.3*  --  11.1*  --   HCT 35.5*  --  39.1  --  35.9*  --   PLT 157  --  143*  --  147*  --   LABPROT 24.6*  --   --  25.0*  --  26.1*  INR 2.17  --   --  2.23  --  2.35  CREATININE 9.90* 10.18*  --   --  5.18*  --     Estimated Creatinine Clearance: 11.6 mL/min (A) (by C-G formula based on SCr of 5.18 mg/dL (H)).  Assessment: 83 yo male admitted with acute respiratory failure, on warfarin PTA for afib. Per note, DVT/PE is a possibility, V/Q scan ordered. Successful cardioversion this morning.  INR now up to 2.3.   -Hgb low but stable, plts 147-no bleeding noted -No new DDIs noted since admission  PTA warfarin dose 2 mg PO daily  Goal of Therapy:  INR 2-3 Monitor platelets by anticoagulation protocol: Yes   Plan:  Warfarin 4mg  PO x1 again tonight Daily INR, CBC Monitor for s/sx bleeding, PO intake, new DDIs  Erin Hearing PharmD.,  BCPS Clinical Pharmacist Pager [phone] 12/11/2016 10:46 AM

## 2016-12-11 NOTE — Progress Notes (Signed)
[facility] Progress Note   Subjective: no c/o's  Vitals:   12/11/16 1010 12/11/16 1020 12/11/16 1030 12/11/16 1202  BP: (!) 105/39 (!) 112/43 (!) 116/43 (!) 141/58  Pulse: 69 62 63 78  Resp: 20 (!) 21 (!) 25 20  Temp: 97.8 F (36.6 C)   97.4 F (36.3 C)  TempSrc: Oral   Oral  SpO2: 100% 98% 96% 96%  Weight:      Height:        Inpatient medications: . allopurinol  100 mg Oral Daily  . amiodarone  200 mg Oral Daily  . atorvastatin  10 mg Oral QHS  . Chlorhexidine Gluconate Cloth  6 each Topical Q0600  . furosemide  40 mg Oral Once per day on Mon Wed Fri  . levothyroxine  50 mcg Oral QAC breakfast  . metoprolol succinate  25 mg Oral QHS  . mirtazapine  30 mg Oral QHS  . multivitamin  1 tablet Oral QPM  . mupirocin ointment  1 application Nasal BID  . sevelamer carbonate  800 mg Oral Q supper  . vitamin B-12  1,000 mcg Oral QPM  . warfarin  4 mg Oral ONCE-1800  . Warfarin - Pharmacist Dosing Inpatient   Does not apply q1800   . sodium chloride 10 mL/hr (12/08/16 1152)  . diltiazem (CARDIZEM) infusion     acetaminophen, ondansetron **OR** ondansetron (ZOFRAN) IV, senna-docusate, technetium TC 54M diethylenetriame-pentaacetic acid  Exam: Alert, joking around, hard to understand due to thick accent No jvd Chest clear bilat Irreg irreg no gallop Abd obese soft ntnd GU indwelling foley with murky small amts of urine in bag Ext trace LE edema NF, ox 3   CXR bibasilar atx, o/w no sig changes  Dialysis: [facility] MWF  EDW 216lb  98kg Hep none   3.5 hours      Assessment: 1. Afib with RVR: had TEE cardioversion to NSR this am.  Stable now.  2. ESRD: HD MWF 3. Volume: 3 kg under dry wt, mild vol excess improving 4. HTN: cont MTP qhs, no chg, BP's good 5. Anemia: Hgb 11.9. No ESA needed yet. 6. Metabolic bone disease: Ca ok. Phos pending. Continue Renvela as binder. Will review records and add VDRA if needed. 7. Urinary retention: sp course of abx ,  foley cath put in within last few weeks, has f/u "bladder test" on May 24th and f/u urol appt with Dr Jeffie Pollock on May 31st. UA here dirty however no fevers here , urine cx not repeated.     Plan - HD Friday, 1st shift in case of dc tomorrow.    [name] MD [facility] pager [phone]   12/11/2016, 2:11 PM    Recent Labs Lab 12/09/16 0150 12/09/16 1225 12/10/16 1158  NA 140 137 133*  K 4.4 4.8 4.3  CL 102 101 95*  CO2 26 25 27   GLUCOSE 97 95 87  BUN 30* 32* 11  CREATININE 9.90* 10.18* 5.18*  CALCIUM 8.3* 8.2* 7.9*  PHOS  --  3.3 2.3*    Recent Labs Lab 12/08/16 1959 12/09/16 0150 12/09/16 1225 12/10/16 1158  AST 20 15  --   --   ALT 9* 6*  --   --   ALKPHOS 112 109  --   --   BILITOT 0.6 0.5  --   --   PROT 5.9* 5.5*  --   --   ALBUMIN 3.0* 2.8* 2.8* 2.7*    Recent Labs Lab 12/09/16  0150 12/09/16 1758 12/10/16 1158  WBC 5.8 6.5 5.9  HGB 10.9* 12.3* 11.1*  HCT 35.5* 39.1 35.9*  MCV 95.2 94.7 94.2  PLT 157 143* 147*   Iron/TIBC/Ferritin/ %Sat    Component Value Date/Time   IRON 49 03/31/2011 2255   TIBC 276 03/31/2011 2255   FERRITIN 320 03/31/2011 2255   IRONPCTSAT 18 (L) 03/31/2011 2255

## 2016-12-11 NOTE — Evaluation (Signed)
Physical Therapy Evaluation Patient Details Name: Steven [name] MRN: [id number] DOB: [birth year] Today's Date: 12/11/2016   History of Present Illness  Patient is a 83 y/o male who presents with complaints of progressive shortness of breath after missing HD. s/p cardioversion today. CXR-Low lung volumes with basilar atelectasis. Cardiomegaly with mild increase in basilar interstitial prominence. PMH includes HTN, HLD, ESRD, atrial fibrillation on Coumadin   Clinical Impression  Patient presents with generalized weakness, deconditioning, impaired balance and impaired mobility s/p above. Tolerated standing x2 and taking a few steps to get to chair with Min A for balance/safety. Pt Mod I PTA using SPC bs RW and has caregiver/friend assist with all driving and IADLs. Caregiver reports she can provide 24/7 supervision at d/c. BP stable throughout session. Will follow acutely to maximize independence and mobility prior to return home.    Follow Up Recommendations Home health PT;Supervision for mobility/OOB;Supervision/Assistance - 24 hour    Equipment Recommendations  None recommended by PT    Recommendations for Other Services OT consult     Precautions / Restrictions Precautions Precautions: Fall Restrictions Weight Bearing Restrictions: No      Mobility  Bed Mobility Overal bed mobility: Needs Assistance Bed Mobility: Rolling;Sidelying to Sit Rolling: Min guard Sidelying to sit: Min guard;HOB elevated       General bed mobility comments: Use of rail but no assist needed. + dizziness. BP stable.  Transfers Overall transfer level: Needs assistance Equipment used: Rolling walker (2 wheeled) Transfers: Sit to/from [facility] Sit to Stand: Mod assist;From elevated surface;Min assist Stand pivot transfers: Min assist       General transfer comment: Assist to power to standing on first attempt with cues for hand placement progressing to Min A on second attempt.,  Able to take a few steps to get to chair. FLexed posture.  Ambulation/Gait                Stairs            Wheelchair Mobility    Modified Rankin (Stroke Patients Only)       Balance Overall balance assessment: Needs assistance Sitting-balance support: Feet supported;Single extremity supported Sitting balance-Leahy Scale: Fair     Standing balance support: During functional activity;Bilateral upper extremity supported Standing balance-Leahy Scale: Poor Standing balance comment: Reliant on BUEs for support in standing.                             Pertinent Vitals/Pain Pain Assessment: No/denies pain    Home Living Family/patient expects to be discharged to:: Private residence Living Arrangements: Alone Available Help at Discharge: Friend(s);Personal care attendant;Available 24 hours/day Type of Home: House Home Access: Ramped entrance     Home Layout: Two level Home Equipment: Walker - 2 wheels;Bedside commode;Wheelchair - manual;Shower seat Additional Comments: Pt has two walkers. one upstairs and 1 down.    Prior Function Level of Independence: Independent with assistive device(s)         Comments: Uses SPC for household ambulation and RW for community. Friend/caregiver takes him to dialysis, helps with meals etc.      Hand Dominance   Dominant Hand: Right    Extremity/Trunk Assessment   Upper Extremity Assessment Upper Extremity Assessment: Defer to OT evaluation    Lower Extremity Assessment Lower Extremity Assessment: Generalized weakness    Cervical / Trunk Assessment Cervical / Trunk Assessment: Kyphotic  Communication   Communication: No difficulties  Cognition Arousal/Alertness:  Awake/alert Behavior During Therapy: WFL for tasks assessed/performed Overall Cognitive Status: Within Functional Limits for tasks assessed                                        General Comments General comments (skin  integrity, edema, etc.): BP stable throughout.    Exercises     Assessment/Plan    PT Assessment Patient needs continued PT services  PT Problem List Decreased strength;Decreased mobility;Decreased activity tolerance;Decreased balance;Cardiopulmonary status limiting activity       PT Treatment Interventions Therapeutic activities;Gait training;Therapeutic exercise;Patient/family education;Balance training;Functional mobility training;Stair training    PT Goals (Current goals can be found in the Care Plan section)  Acute Rehab PT Goals Patient Stated Goal: to get up and move PT Goal Formulation: With patient Time For Goal Achievement: 12/25/16 Potential to Achieve Goals: Good    Frequency Min 3X/week   Barriers to discharge   caregiver says she can provide 24/7    Co-evaluation               AM-PAC PT "6 Clicks" Daily Activity  Outcome Measure Difficulty turning over in bed (including adjusting bedclothes, sheets and blankets)?: None Difficulty moving from lying on back to sitting on the side of the bed? : None Difficulty sitting down on and standing up from a chair with arms (e.g., wheelchair, bedside commode, etc,.)?: Total Help needed moving to and from a bed to chair (including a wheelchair)?: A Little Help needed walking in hospital room?: A Little Help needed climbing 3-5 steps with a railing? : A Lot 6 Click Score: 17    End of Session Equipment Utilized During Treatment: Gait belt Activity Tolerance: Patient tolerated treatment well Patient left: in chair;with call bell/phone within reach;with chair alarm set;with nursing/sitter in room Nurse Communication: Mobility status PT Visit Diagnosis: Unsteadiness on feet (R26.81);Muscle weakness (generalized) (M62.81)    Time: 1410-1433 PT Time Calculation (min) (ACUTE ONLY): 23 min   Charges:   PT Evaluation $PT Eval Low Complexity: 1 Procedure PT Treatments $Therapeutic Activity: 8-22 mins   PT G Codes:         Wray Kearns, PT, DPT [phone]    Marguarite Arbour A Daquarius Dubeau 12/11/2016, 2:42 PM

## 2016-12-11 NOTE — Anesthesia Postprocedure Evaluation (Addendum)
Anesthesia Post Note  Patient: Steven [name]  Procedure(s) Performed: Procedure(s) (LRB): TRANSESOPHAGEAL ECHOCARDIOGRAM (TEE) (N/A) CARDIOVERSION (N/A)  Patient location during evaluation: PACU Anesthesia Type: General Level of consciousness: sedated Pain management: pain level controlled Vital Signs Assessment: post-procedure vital signs reviewed and stable Respiratory status: spontaneous breathing and respiratory function stable Cardiovascular status: stable Anesthetic complications: no       Last Vitals:  Vitals:   12/11/16 1020 12/11/16 1030  BP: (!) 112/43 (!) 116/43  Pulse: 62 63  Resp: (!) 21 (!) 25  Temp:      Last Pain:  Vitals:   12/11/16 1010  TempSrc: Oral  PainSc:                  Netanya Yazdani DANIEL

## 2016-12-11 NOTE — Progress Notes (Signed)
Subjective:  S26me hypotension yesterday on dialysis as well as after. Cardiazem d/c yest.   Objective:  Vital Signs in the last 24 hours: BP 98/65 (BP Location: Right Arm)   Pulse 98   Temp 98.6 F (37 C) (Oral)   Resp (!) 22   Ht 5\' 11"  (1.803 m)   Wt 94.3 kg (207 lb 14.3 oz)   SpO2 94%   BMI 29.00 kg/m   Physical Exam: Friendly white male in no acute distress Lungs:  Clear  Cardiac:  Irregular rhythm, normal S1 and S2, no S3, 1 to 2/6 systolic murmur Abdomen:  Soft, nontender, no masses Extremities: 1+ edema noted   Intake/Output from previous day: 05/16 0701 - 05/17 0700 In: 389 [P.O.:300; I.V.:89] Out: 1920  Weight Filed Weights   12/09/16 1600 12/10/16 1020 12/10/16 1412  Weight: 96.7 kg (213 lb 3 oz) 96.5 kg (212 lb 11.9 oz) 94.3 kg (207 lb 14.3 oz)    Lab Results: Basic Metabolic Panel:  Recent Labs  12/09/16 1225 12/10/16 1158  NA 137 133*  K 4.8 4.3  CL 101 95*  CO2 25 27  GLUCOSE 95 87  BUN 32* 11  CREATININE 10.18* 5.18*    CBC:  Recent Labs  12/09/16 1758 12/10/16 1158  WBC 6.5 5.9  HGB 12.3* 11.1*  HCT 39.1 35.9*  MCV 94.7 94.2  PLT 143* 147*    BNP    Component Value Date/Time   BNP 777.6 (H) 12/08/2016 1153    PROTIME: Lab Results  Component Value Date   INR 2.35 12/11/2016   INR 2.23 12/10/2016   INR 2.17 12/09/2016    Telemetry: Atrial fibrillation with mildly rapid response  Assessment/Plan:  1.  Atrial fibrillation persistent for at least a year 2.  Chronic diastolic heart failure with volume overload better 3.  End-stage renal disease on dialysis 4.  Subtherapeutic anticoagulation on admission, now therapeutic  Recommendations:  Awaiting TEE today.  Hopefully can restore sinus rhythm as may help BP and help control rate.  He has been on amiodarone for about a year now. Was palanning cardiovsersion last summer but patient did not come back to office.    Kerry Hough  MD  [facility]  12/11/2016, 9:01 AM

## 2016-12-11 NOTE — CV Procedure (Signed)
TEE/DCC: Anesthesia : Propofol  EF 55% MIld MR MIld AS No LAA thrombus Normal RA/RV No ASD/PFO redundant septum Mild aortic debris Mild TR  DCC x 1 150J converted from afib/flutter rate 110 to NSR rate 70 INR Rx 2.38 No immediate neurologic sequelae  Jenkins Rouge, MD

## 2016-12-12 ENCOUNTER — Inpatient Hospital Stay (HOSPITAL_COMMUNITY): Payer: Medicare Other

## 2016-12-12 ENCOUNTER — Inpatient Hospital Stay (HOSPITAL_COMMUNITY)
Admission: RE | Admit: 2016-12-12 | Discharge: 2016-12-12 | Disposition: A | Discharge status: Home / Self Care | Payer: Medicare Other | Source: Ambulatory Visit

## 2016-12-12 DIAGNOSIS — I4891 Unspecified atrial fibrillation: Secondary | ICD-10-CM

## 2016-12-12 DIAGNOSIS — J9601 Acute respiratory failure with hypoxia: Secondary | ICD-10-CM

## 2016-12-12 LAB — RENAL FUNCTION PANEL
Albumin: 2.8 g/dL — ABNORMAL LOW (ref 3.5–5.0)
Anion gap: 11 (ref 5–15)
BUN: 22 mg/dL — AB (ref 6–20)
CO2: 26 mmol/L (ref 22–32)
CREATININE: 7.5 mg/dL — AB (ref 0.61–1.24)
Calcium: 8 mg/dL — ABNORMAL LOW (ref 8.9–10.3)
Chloride: 96 mmol/L — ABNORMAL LOW (ref 101–111)
GFR calc Af Amer: 7 mL/min — ABNORMAL LOW (ref >60)
GFR calc non Af Amer: 6 mL/min — ABNORMAL LOW (ref >60)
GLUCOSE: 88 mg/dL (ref 65–99)
Phosphorus: 4.1 mg/dL (ref 2.5–4.6)
Potassium: 3.9 mmol/L (ref 3.5–5.1)
Sodium: 133 mmol/L — ABNORMAL LOW (ref 135–145)

## 2016-12-12 LAB — CBC
HCT: 35.9 % — ABNORMAL LOW (ref 39.0–52.0)
Hemoglobin: 11.1 g/dL — ABNORMAL LOW (ref 13.0–17.0)
MCH: 28.8 pg (ref 26.0–34.0)
MCHC: 30.9 g/dL (ref 30.0–36.0)
MCV: 93.2 fL (ref 78.0–100.0)
PLATELETS: 131 10*3/uL — AB (ref 150–400)
RBC: 3.85 MIL/uL — AB (ref 4.22–5.81)
RDW: 16.9 % — ABNORMAL HIGH (ref 11.5–15.5)
WBC: 6.2 10*3/uL (ref 4.0–10.5)

## 2016-12-12 LAB — PROTIME-INR
INR: 2.84
PROTHROMBIN TIME: 30.4 s — AB (ref 11.4–15.2)

## 2016-12-12 MED ORDER — LIDOCAINE-PRILOCAINE 2.5-2.5 % EX CREA: 1.0000 "application " | TOPICAL_CREAM | CUTANEOUS | Status: DC | PRN

## 2016-12-12 MED ORDER — SODIUM CHLORIDE 0.9 % IV SOLN: 100.0000 mL | INTRAVENOUS | Status: DC | PRN

## 2016-12-12 MED ORDER — ALTEPLASE 2 MG IJ SOLR: 2.0000 mg | Freq: Once | INTRAMUSCULAR | Status: DC | PRN

## 2016-12-12 MED ORDER — HEPARIN SODIUM (PORCINE) 1000 UNIT/ML DIALYSIS: 1000.0000 [IU] | INTRAMUSCULAR | Status: DC | PRN

## 2016-12-12 MED ORDER — DILTIAZEM HCL 60 MG PO TABS
60.0000 mg | ORAL_TABLET | Freq: Three times a day (TID) | ORAL | Status: DC
  Administered 2016-12-12 – 2016-12-13 (×3): 60 mg via ORAL
  Filled 2016-12-12 (×3): qty 1

## 2016-12-12 MED ORDER — LIDOCAINE HCL (PF) 1 % IJ SOLN
5.0000 mL | INTRAMUSCULAR | Status: DC | PRN
  Filled 2016-12-12: qty 5

## 2016-12-12 MED ORDER — PENTAFLUOROPROP-TETRAFLUOROETH EX AERO: 1.0000 "application " | INHALATION_SPRAY | CUTANEOUS | Status: DC | PRN

## 2016-12-12 MED ORDER — WARFARIN SODIUM 2 MG PO TABS
2.0000 mg | ORAL_TABLET | Freq: Every evening | ORAL | Status: DC
  Administered 2016-12-12: 2 mg via ORAL
  Filled 2016-12-12: qty 1

## 2016-12-12 NOTE — Progress Notes (Signed)
  Echocardiogram 2D Echocardiogram has been performed.  Johny Chess 12/12/2016, 5:36 PM

## 2016-12-12 NOTE — Progress Notes (Signed)
[place] for Warfarin  Indication: atrial fibrillation  Allergies  Allergen Reactions  . Hydrocodone Other (See Comments)    Reaction:  Confusion   . Penicillins Hives and Other (See Comments)    Has patient had a PCN reaction causing immediate rash, facial/tongue/throat swelling, SOB or lightheadedness with hypotension: No Has patient had a PCN reaction causing severe rash involving mucus membranes or skin necrosis: No Has patient had a PCN reaction that required hospitalization No Has patient had a PCN reaction occurring within the last 10 years: No If all of the above answers are "NO", then may proceed with Cephalosporin use.    Patient Measurements: Height: 5\' 11"  (180.3 cm) Weight: 251 lb 15.8 oz (114.3 kg) IBW/kg (Calculated) : 75.3  Vital Signs: Temp: 98.1 F (36.7 C) (05/18 1136) Temp Source: Oral (05/18 1136) BP: 120/56 (05/18 1136) Pulse Rate: 102 (05/18 1136)  Labs:  Recent Labs  12/09/16 1225  12/09/16 1758 12/10/16 0926 12/10/16 1158 12/11/16 0251 12/12/16 0607 12/12/16 0731 12/12/16 0732  HGB  --   < > 12.3*  --  11.1*  --   --   --  11.1*  HCT  --   --  39.1  --  35.9*  --   --   --  35.9*  PLT  --   --  143*  --  147*  --   --   --  131*  LABPROT  --   --   --  25.0*  --  26.1* 30.4*  --   --   INR  --   --   --  2.23  --  2.35 2.84  --   --   CREATININE 10.18*  --   --   --  5.18*  --   --  7.50*  --   < > = values in this interval not displayed.  Estimated Creatinine Clearance: 8.8 mL/min (A) (by C-G formula based on SCr of 7.5 mg/dL (H)).  Assessment: 83 yo male admitted with acute respiratory failure, on warfarin PTA for afib. Per note, DVT/PE is a possibility, V/Q scan ordered. Successful cardioversion this morning.  INR today remains therapeutic (INR 2.84 << 2.35, goal of 2-3). Hgb low but stable - no bleeding noted at this time.   PTA warfarin dose 2 mg PO daily  Goal of Therapy:  INR 2-3 Monitor  platelets by anticoagulation protocol: Yes   Plan:  1. Warfarin 2 mg today as already entered by MD 2. Will continue to monitor for any signs/symptoms of bleeding and will follow up with PT/INR in the a.m.   Thank you for allowing pharmacy to be a part of this patient's care.  Alycia Rossetti, PharmD, BCPS Clinical Pharmacist Pager: [phone] Clinical phone for 12/12/2016 from 7a-3:30p: [phone] If after 3:30p, please call main pharmacy at: [phone] 12/12/2016 11:55 AM

## 2016-12-12 NOTE — Procedures (Signed)
  I was present at this dialysis session, have reviewed the session itself and made  appropriate changes [name] MD [place] pager [phone]   12/12/2016, 5:14 PM

## 2016-12-12 NOTE — Progress Notes (Signed)
PT Cancellation Note  Patient Details Name: DOLPH TAVANO MRN: [id number] DOB: [birth year]   Cancelled Treatment:    Reason Eval/Treat Not Completed: Patient at procedure or test/unavailable. Pt in dialysis. Will check back later as time allows.    Scheryl Marten PT, DPT  [phone]  12/12/2016, 7:47 AM

## 2016-12-12 NOTE — Progress Notes (Signed)
@IPLOG         PROGRESS NOTE                                                                                                                                                                                                             Patient Demographics:    Steven [name], is a 83 y.o. male, DOB - 08-Feb-1927, ION:[id number]  Admit date - 12/08/2016   Admitting Physician Waldemar Dickens, MD  Outpatient Primary MD for the patient is Briscoe Deutscher, MD  LOS - 4  Chief Complaint  Patient presents with  . Weakness  . Tachycardia       Brief Narrative  83 year old male with past medical history of hypertension, hyperlipidemia, ESRD, atrial fibrillation on Coumadin came to the ER with complaints of progressive shortness of breath after missing HD. Because of atrial fibrillation which has been persistently underwent TEE cardioversion.   Subjective:    [name] today has, No headache, No chest pain, No abdominal pain - No Nausea, No new weakness tingling or numbness, No Cough - SOB.     Assessment  & Plan :     1.Acute hypoxic respiratory failure due to missed dialysis in a patient with ESRD causing acute on chronic diastolic CHF EF preserved at around 60%. Patient underwent dialysis with excessive fluid removal, resolution of acute on chronic diastolic heart failure and acute hypoxic respiratory failure. Back to baseline.  2. ESRD. Nephrology on board being dialyzed per routine he is on M, W, F schedule. He still takes oral Lasix which will be continued.  3. Chronic atrial fibrillation. [place] vasc 2 score of at least 4. Had issues with rate control requiring TEE cardioversion, cardiology on board, he is currently on combination of amiodarone, beta blocker or rate control, also on Coumadin pharmacy monitoring INR.  4. Anemia of chronic disease. Due to ESRD. Stable.  5. Hypothyroidism. Check TSH. continue Synthroid.  6. Depression. On Remeron continue.  7. Dyslipidemia. On  statin.  8. History of vitamin B-12 deficiency. Continue oral supplementation.  Diet : Diet renal/carb modified with fluid restriction Diet-HS Snack? Nothing; Room service appropriate? Yes; Fluid consistency: Thin    Family Communication  :  None  Code Status :  Full  Disposition Plan  :  TBD  Consults  :  Cards  Procedures  :    DVT Prophylaxis  :  Coumadin  Lab Results  Component Value Date   INR 2.84 12/12/2016   INR 2.35 12/11/2016   INR 2.23 12/10/2016  Lab Results  Component Value Date   PLT 131 (L) 12/12/2016    Inpatient Medications  Scheduled Meds: . allopurinol  100 mg Oral Daily  . amiodarone  200 mg Oral Daily  . atorvastatin  10 mg Oral QHS  . Chlorhexidine Gluconate Cloth  6 each Topical Q0600  . furosemide  40 mg Oral Once per day on Mon Wed Fri  . levothyroxine  50 mcg Oral QAC breakfast  . metoprolol succinate  25 mg Oral QHS  . mirtazapine  30 mg Oral QHS  . multivitamin  1 tablet Oral QPM  . mupirocin ointment  1 application Nasal BID  . sevelamer carbonate  800 mg Oral Q supper  . vitamin B-12  1,000 mcg Oral QPM  . warfarin  2 mg Oral QPM  . Warfarin - Pharmacist Dosing Inpatient   Does not apply q1800   Continuous Infusions: PRN Meds:.acetaminophen, [DISCONTINUED] ondansetron **OR** ondansetron (ZOFRAN) IV, senna-docusate, technetium TC 31M diethylenetriame-pentaacetic acid  Antibiotics  :    Anti-infectives    None         Objective:   Vitals:   12/12/16 1000 12/12/16 1030 12/12/16 1040 12/12/16 1136  BP: 112/70 117/72 124/77 (!) 120/56  Pulse: (!) 109 (!) 108 (!) 106 (!) 102  Resp: 16 17 18 18   Temp:   98.2 F (36.8 C) 98.1 F (36.7 C)  TempSrc:   Oral Oral  SpO2:   95% 98%  Weight:   114.3 kg (251 lb 15.8 oz)   Height:        Wt Readings from Last 3 Encounters:  12/12/16 114.3 kg (251 lb 15.8 oz)  11/17/16 97.5 kg (215 lb)  11/16/16 97.5 kg (215 lb)     Intake/Output Summary (Last 24 hours) at 12/12/16  1345 Last data filed at 12/12/16 1316  Gross per 24 hour  Intake              360 ml  Output             2450 ml  Net            -2090 ml     Physical Exam  Awake Alert,   No new F.N deficits, Normal affect [place].AT,PERRAL Supple Neck,No JVD, No cervical lymphadenopathy appriciated.  Symmetrical Chest wall movement, Good air movement bilaterally, CTAB RRR,No Gallops,Rubs or new Murmurs, No Parasternal Heave +ve B.Sounds, Abd Soft, No tenderness, No organomegaly appriciated, No rebound - guarding or rigidity. No Cyanosis, Clubbing or edema, No new Rash or bruise       Data Review:    CBC  Recent Labs Lab 12/08/16 1153 12/08/16 1209 12/09/16 0150 12/09/16 1758 12/10/16 1158 12/12/16 0732  WBC 6.0  --  5.8 6.5 5.9 6.2  HGB 11.3* 11.9* 10.9* 12.3* 11.1* 11.1*  HCT 37.1* 35.0* 35.5* 39.1 35.9* 35.9*  PLT 156  --  157 143* 147* 131*  MCV 95.9  --  95.2 94.7 94.2 93.2  MCH 29.2  --  29.2 29.8 29.1 28.8  MCHC 30.5  --  30.7 31.5 30.9 30.9  RDW 17.5*  --  17.2* 17.3* 17.2* 16.9*    Chemistries   Recent Labs Lab 12/08/16 1959 12/09/16 0150 12/09/16 1225 12/10/16 1158 12/12/16 0731  NA 139 140 137 133* 133*  K 4.1 4.4 4.8 4.3 3.9  CL 102 102 101 95* 96*  CO2 25 26 25 27 26   GLUCOSE 130* 97 95 87 88  BUN 29* 30* 32* 11 22*  CREATININE 9.51* 9.90* 10.18* 5.18* 7.50*  CALCIUM 8.3* 8.3* 8.2* 7.9* 8.0*  AST 20 15  --   --   --   ALT 9* 6*  --   --   --   ALKPHOS 112 109  --   --   --   BILITOT 0.6 0.5  --   --   --    ------------------------------------------------------------------------------------------------------------------ No results for input(s): CHOL, HDL, LDLCALC, TRIG, CHOLHDL, LDLDIRECT in the last 72 hours.  Lab Results  Component Value Date   HGBA1C 5.9 (H) 03/31/2011   ------------------------------------------------------------------------------------------------------------------ No results for input(s): TSH, T4TOTAL, T3FREE, THYROIDAB in the  last 72 hours.  Invalid input(s): FREET3 ------------------------------------------------------------------------------------------------------------------ No results for input(s): VITAMINB12, FOLATE, FERRITIN, TIBC, IRON, RETICCTPCT in the last 72 hours.  Coagulation profile  Recent Labs Lab 12/08/16 1153 12/09/16 0150 12/10/16 0926 12/11/16 0251 12/12/16 0607  INR 1.88 2.17 2.23 2.35 2.84    No results for input(s): DDIMER in the last 72 hours.  Cardiac Enzymes No results for input(s): CKMB, TROPONINI, MYOGLOBIN in the last 168 hours.  Invalid input(s): CK ------------------------------------------------------------------------------------------------------------------    Component Value Date/Time   BNP 777.6 (H) 12/08/2016 1153    Micro Results Recent Results (from the past 240 hour(s))  MRSA PCR Screening     Status: Abnormal   Collection Time: 12/08/16  6:04 PM  Result Value Ref Range Status   MRSA by PCR POSITIVE (A) NEGATIVE Final    Comment:        The GeneXpert MRSA Assay (FDA approved for NASAL specimens only), is one component of a comprehensive MRSA colonization surveillance program. It is not intended to diagnose MRSA infection nor to guide or monitor treatment for MRSA infections. RESULT CALLED TO, READ BACK BY AND VERIFIED WITHRaliegh Ip [facility] RN 2006 12/08/16 A BROWNING     Radiology Reports X-ray Chest Pa And Lateral  Result Date: 12/10/2016 CLINICAL DATA:  Shortness of breath. EXAM: CHEST  2 VIEW COMPARISON:  12/08/2016. FINDINGS: Cardiomegaly. Mild increase in basilar interstitial prominence. Mild CHF cannot be excluded. Low lung volumes with mild basilar atelectasis. Mild right base infiltrate cannot be excluded. No pneumothorax. IMPRESSION: 1. Cardiomegaly with mild increase in basilar interstitial prominence. Mild CHF cannot be excluded. 2. Low lung volumes with basilar atelectasis. Mild infiltrate right lung base cannot be excluded .  Electronically Signed   By: Marcello Moores  Register   On: 12/10/2016 07:27   Dg Chest Port 1 View  Result Date: 12/08/2016 CLINICAL DATA:  Weakness, tachycardia, atrial fibrillation. EXAM: PORTABLE CHEST 1 VIEW COMPARISON:  09/27/2015. FINDINGS: Trachea is midline. Heart size is accentuated by AP technique and low lung volumes. Thoracic aorta is calcified. Bibasilar volume loss. Tiny left pleural effusion. No airspace consolidation. IMPRESSION: Bibasilar atelectasis with a tiny left pleural effusion. Electronically Signed   By: Lorin Picket M.D.   On: 12/08/2016 12:47    Time Spent in minutes  30   Lala Lund M.D on 12/12/2016 at 1:45 PM  Between 7am to 7pm - Pager - [phone] ( page via [email or web address], text pages only, please mention full 10 digit call back number). After 7pm go to [email or web address] - password [facility]

## 2016-12-12 NOTE — Progress Notes (Signed)
Subjective:  Seen in dialysis today.  Back in atrial fibrillation today following successful TEE yesterday.  Objective:  Vital Signs in the last 24 hours: BP (!) 116/59   Pulse (!) 112   Temp 98 F (36.7 C) (Oral)   Resp 18   Ht 5\' 11"  (1.803 m)   Wt 116.3 kg (256 lb 6.3 oz)   SpO2 97%   BMI 35.76 kg/m   Physical Exam: Friendly white male in no acute distress Lungs:  Clear  Cardiac:  Irregular rhythm, normal S1 and S2, no S3, 1 to 2/6 systolic murmur Abdomen:  Soft, nontender, no masses Extremities: 1+ edema noted   Intake/Output from previous day: 05/17 0701 - 05/18 0700 In: 1070 [P.O.:720; I.V.:350] Out: 50 [Urine:50] Weight Filed Weights   12/10/16 1412 12/11/16 2135 12/12/16 0650  Weight: 94.3 kg (207 lb 14.3 oz) 117.5 kg (259 lb 1.6 oz) 116.3 kg (256 lb 6.3 oz)    Lab Results: Basic Metabolic Panel:  Recent Labs  12/10/16 1158 12/12/16 0731  NA 133* 133*  K 4.3 3.9  CL 95* 96*  CO2 27 26  GLUCOSE 87 88  BUN 11 22*  CREATININE 5.18* 7.50*    CBC:  Recent Labs  12/10/16 1158 12/12/16 0732  WBC 5.9 6.2  HGB 11.1* 11.1*  HCT 35.9* 35.9*  MCV 94.2 93.2  PLT 147* 131*    BNP    Component Value Date/Time   BNP 777.6 (H) 12/08/2016 1153    PROTIME: Lab Results  Component Value Date   INR 2.84 12/12/2016   INR 2.35 12/11/2016   INR 2.23 12/10/2016    Telemetry: Atrial fibrillation with mildly rapid response  Assessment/Plan:  1.  Atrial fibrillation persistent for at least a year with failure to maintain sinus after successful TEE yesterday 2.  Chronic diastolic heart failure with volume overload better 3.  End-stage renal disease on dialysis 4.  Subtherapeutic anticoagulation on admission, now therapeutic  Recommendations:  I don't think any further efforts to maintain sinus will be successful  Bp is a little challenging on dialysis. I would continue amiodarone for rate control as well as diltiazem.  We can try some low dose beta  blocker if rates remain up. I would need to see in followup in 2 weeks.   Kerry Hough  MD [facility]  12/12/2016, 9:51 AM

## 2016-12-12 NOTE — Progress Notes (Addendum)
Pt noted to have maroon colored shearing wound to L buttocks. 1.7 x 8 cm. Pt reports discomfort to touch. Question deep tissue injury. Barrier cream applied and pillow placed for pressure reduction. Dorthey Sawyer, RN

## 2016-12-12 NOTE — Progress Notes (Signed)
Physical Therapy Treatment Patient Details Name: Steven [name] MRN: [id number] DOB: 08/27/1926 Today's Date: 12/12/2016    History of Present Illness Patient is a 83 y/o male who presents with complaints of progressive shortness of breath after missing HD. s/p cardioversion today. CXR-Low lung volumes with basilar atelectasis. Cardiomegaly with mild increase in basilar interstitial prominence. PMH includes HTN, HLD, ESRD, atrial fibrillation on Coumadin     PT Comments    Pt is able to perform bed mobs and gait with decreased assistance required this session. Pt is able to perform short distance gait but does report increased fatigue following dialysis. Pt has cg's who are available at daily and can have 24hr care if needed when he returns home. Pt is making improvements with mobility this session, but will benefit from HHPT at discharge.     Follow Up Recommendations  Home health PT;Supervision for mobility/OOB;Supervision/Assistance - 24 hour     Equipment Recommendations  None recommended by PT    Recommendations for Other Services OT consult     Precautions / Restrictions Precautions Precautions: Fall Restrictions Weight Bearing Restrictions: No    Mobility  Bed Mobility Overal bed mobility: Needs Assistance Bed Mobility: Supine to Sit;Sit to Supine     Supine to sit: Min guard Sit to supine: Min guard   General bed mobility comments: Min guard with HOB elevated. Uses railing to assist,  no c/o dizziness  Transfers Overall transfer level: Needs assistance Equipment used: Rolling walker (2 wheeled) Transfers: Sit to/from Stand Sit to Stand: Min guard         General transfer comment: Min guard from EOB, cues for pushing up from bed to stand. Flexed posture in standing  Ambulation/Gait Ambulation/Gait assistance: Min guard Ambulation Distance (Feet): 20 Feet Assistive device: Rolling walker (2 wheeled) Gait Pattern/deviations: Step-through pattern;Decreased  step length - right;Decreased step length - left Gait velocity: decreased Gait velocity interpretation: Below normal speed for age/gender General Gait Details: forward trunk lean with gait, step through sequencing   Stairs            Wheelchair Mobility    Modified Rankin (Stroke Patients Only)       Balance Overall balance assessment: Needs assistance Sitting-balance support: No upper extremity supported;Feet supported Sitting balance-Leahy Scale: Good     Standing balance support: During functional activity;Bilateral upper extremity supported Standing balance-Leahy Scale: Poor Standing balance comment: relies on RW for stability in standing                            Cognition Arousal/Alertness: Awake/alert Behavior During Therapy: WFL for tasks assessed/performed Overall Cognitive Status: Within Functional Limits for tasks assessed                                        Exercises      General Comments        Pertinent Vitals/Pain Pain Assessment: No/denies pain    Home Living                      Prior Function            PT Goals (current goals can now be found in the care plan section) Acute Rehab PT Goals Patient Stated Goal: to get up and move Progress towards PT goals: Progressing toward goals    Frequency  Min 3X/week      PT Plan Current plan remains appropriate    Co-evaluation              AM-PAC PT "6 Clicks" Daily Activity  Outcome Measure  Difficulty turning over in bed (including adjusting bedclothes, sheets and blankets)?: None Difficulty moving from lying on back to sitting on the side of the bed? : None Difficulty sitting down on and standing up from a chair with arms (e.g., wheelchair, bedside commode, etc,.)?: A Little Help needed moving to and from a bed to chair (including a wheelchair)?: A Little Help needed walking in hospital room?: A Little Help needed climbing 3-5  steps with a railing? : A Little 6 Click Score: 20    End of Session Equipment Utilized During Treatment: Gait belt Activity Tolerance: Patient tolerated treatment well Patient left: in bed;with call bell/phone within reach;with family/visitor present Nurse Communication: Mobility status PT Visit Diagnosis: Unsteadiness on feet (R26.81);Muscle weakness (generalized) (M62.81)     Time: 8832-5498 PT Time Calculation (min) (ACUTE ONLY): 19 min  Charges:  $Therapeutic Activity: 8-22 mins                    G Codes:       Scheryl Marten PT, DPT  [phone]    Jacqulyn Liner Sloan Leiter 12/12/2016, 1:56 PM

## 2016-12-12 NOTE — Evaluation (Signed)
Occupational Therapy Evaluation Patient Details Name: Steven [name] MRN: [id number] DOB: [birth year] Today's Date: 12/12/2016    History of Present Illness Patient is a 83 y/o male who presents with complaints of progressive shortness of breath after missing HD. s/p cardioversion today. CXR-Low lung volumes with basilar atelectasis. Cardiomegaly with mild increase in basilar interstitial prominence. PMH includes HTN, HLD, ESRD, atrial fibrillation on Coumadin    Clinical Impression   Pt reports he was managing BADL at mod I level PTA; caregivers assisted with driving to HD and grocery shopping. Currently pt overall min guard for ADL and functional mobility; requires cues to slow down during mobility and walk inside RW instead of pushing it out in front of him. Pt planning to d/c home alone with daytime supervision from friends/caregivers. Recommending HHOT for follow up to maximize independence and safety with ADL and functional mobility upon return home. Pt would benefit from continued skilled OT to address established goals.    Follow Up Recommendations  Home health OT;Supervision/Assistance - 24 hour    Equipment Recommendations  None recommended by OT    Recommendations for Other Services       Precautions / Restrictions Precautions Precautions: Fall Restrictions Weight Bearing Restrictions: No      Mobility Bed Mobility Overal bed mobility: Needs Assistance Bed Mobility: Supine to Sit;Sit to Supine     Supine to sit: Supervision Sit to supine: Supervision   General bed mobility comments: Supervision for safety. HOB slightly elevated with use of bed rail. Pt able to scoot up in bed with use of bed rails and HOB flat.  Transfers Overall transfer level: Needs assistance Equipment used: Rolling walker (2 wheeled) Transfers: Sit to/from Stand Sit to Stand: Min guard         General transfer comment: for safety, no physical assist. Cues for hand placement and upright  posture once in standing    Balance Overall balance assessment: Needs assistance Sitting-balance support: Feet supported;No upper extremity supported Sitting balance-Leahy Scale: Good     Standing balance support: During functional activity;Bilateral upper extremity supported Standing balance-Leahy Scale: Poor Standing balance comment: RW for support                           ADL either performed or assessed with clinical judgement   ADL Overall ADL's : Needs assistance/impaired Eating/Feeding: Set up;Sitting   Grooming: Min guard;Standing   Upper Body Bathing: Set up;Supervision/ safety;Sitting   Lower Body Bathing: Min guard;Sit to/from stand   Upper Body Dressing : Set up;Supervision/safety;Sitting   Lower Body Dressing: Min guard;Sit to/from stand   Toilet Transfer: Min guard;Ambulation;RW       Tub/ Shower Transfer: Walk-in shower;Min guard;Ambulation;Shower seat;3 in 1   Functional mobility during ADLs: Min guard;Rolling walker General ADL Comments: Educated pt on home safety and fall prevention; he verbalized understanding.     Vision         Perception     Praxis      Pertinent Vitals/Pain Pain Assessment: No/denies pain     Hand Dominance Right   Extremity/Trunk Assessment Upper Extremity Assessment Upper Extremity Assessment: Generalized weakness   Lower Extremity Assessment Lower Extremity Assessment: Defer to PT evaluation   Cervical / Trunk Assessment Cervical / Trunk Assessment: Kyphotic   Communication Communication Communication: No difficulties   Cognition Arousal/Alertness: Awake/alert Behavior During Therapy: WFL for tasks assessed/performed Overall Cognitive Status: Within Functional Limits for tasks assessed  General Comments       Exercises     Shoulder Instructions      Home Living Family/patient expects to be discharged to:: Private residence Living  Arrangements: Alone Available Help at Discharge: Friend(s);Personal care attendant;Available PRN/intermittently Type of Home: House Home Access: Ramped entrance     Home Layout: Two level Alternate Level Stairs-Number of Steps: chair lift   Bathroom Shower/Tub: Walk-in shower;Door   [employer] Toilet: Standard     Home Equipment: Environmental consultant - 2 wheels;Bedside commode;Wheelchair - manual;Shower seat          Prior Functioning/Environment Level of Independence: Independent with assistive device(s)        Comments: Uses SPC for household ambulation and RW for community. Friend/caregiver takes him to dialysis, helps with meals etc. Pt mod I for BADL.        OT Problem List: Decreased strength;Decreased activity tolerance;Impaired balance (sitting and/or standing);Obesity      OT Treatment/Interventions: Self-care/ADL training;Energy conservation;DME and/or AE instruction;Therapeutic activities;Patient/family education;Balance training    OT Goals(Current goals can be found in the care plan section) Acute Rehab OT Goals Patient Stated Goal: go home OT Goal Formulation: With patient Time For Goal Achievement: 12/26/16 Potential to Achieve Goals: Good ADL Goals Pt Will Transfer to Toilet: with modified independence;ambulating;regular height toilet Pt Will Perform Toileting - Clothing Manipulation and hygiene: with modified independence;sit to/from stand Pt Will Perform Tub/Shower Transfer: Shower transfer;with modified independence;ambulating;shower seat;rolling walker Additional ADL Goal #1: Pt will gather ADL items and perform UB/LB bathing/dressing at mod I level.  OT Frequency: Min 2X/week   Barriers to D/C:            Co-evaluation              AM-PAC PT "6 Clicks" Daily Activity     Outcome Measure Help from another person eating meals?: None Help from another person taking care of personal grooming?: A Little Help from another person toileting, which includes  using toliet, bedpan, or urinal?: A Little Help from another person bathing (including washing, rinsing, drying)?: A Little Help from another person to put on and taking off regular upper body clothing?: A Little Help from another person to put on and taking off regular lower body clothing?: A Little 6 Click Score: 19   End of Session Equipment Utilized During Treatment: Rolling walker  Activity Tolerance: Patient tolerated treatment well Patient left: in bed;with call bell/phone within reach;with family/visitor present  OT Visit Diagnosis: Other abnormalities of gait and mobility (R26.89);Muscle weakness (generalized) (M62.81)                Time: 1683-7290 OT Time Calculation (min): 16 min Charges:  OT General Charges $OT Visit: 1 Procedure OT Evaluation $OT Eval Moderate Complexity: 1 Procedure G-Codes:     Maleta Pacha A. Ulice Brilliant, M.S., OTR/L Pager: [place] 12/12/2016, 2:44 PM

## 2016-12-12 NOTE — Progress Notes (Signed)
OT Cancellation Note  Patient Details Name: Steven [name] MRN: [id number] DOB: 24-Apr-1927   Cancelled Treatment:    Reason Eval/Treat Not Completed: Patient at procedure or test/ unavailable (currently in HD). Will follow up for OT eval as time allows.  Binnie Kand M.S., OTR/L Pager: [phone]  12/12/2016, 7:41 AM

## 2016-12-13 DIAGNOSIS — Z992 Dependence on renal dialysis: Secondary | ICD-10-CM

## 2016-12-13 DIAGNOSIS — N186 End stage renal disease: Secondary | ICD-10-CM

## 2016-12-13 LAB — TSH: TSH: 4.483 u[IU]/mL (ref 0.350–4.500)

## 2016-12-13 LAB — PROTIME-INR
INR: 3.26
Prothrombin Time: 33.9 seconds — ABNORMAL HIGH (ref 11.4–15.2)

## 2016-12-13 MED ORDER — WARFARIN SODIUM 1 MG PO TABS
1.0000 mg | ORAL_TABLET | Freq: Once | ORAL | Status: DC
  Filled 2016-12-13: qty 1

## 2016-12-13 MED ORDER — DOXERCALCIFEROL 4 MCG/2ML IV SOLN: 2.0000 ug | INTRAVENOUS | Status: DC

## 2016-12-13 NOTE — Care Management Note (Signed)
Case Management Note  Patient Details  Name: NAMARI BRETON MRN: [id number] Date of Birth: 1927-06-22  Subjective/Objective:                 Spoke with patient at the bedside. He states he would like to use [facility]. Referral placed to [facility]. Patient states he does not have any other HH needs. Order to DC to home today    Action/Plan:  DC to home w [place].  Expected Discharge Date:  12/13/16               Expected Discharge Plan:  [facility]  In-House Referral:     Discharge planning Services  CM Consult  Post Acute Care Choice:  Home Health Choice offered to:  Patient  DME Arranged:    DME Agency:     HH Arranged:  PT, OT, RN, Nurse's Aide HH Agency:  (S)  ([facility])  Status of Service:  Completed, signed off  If discussed at [place] of Stay Meetings, dates discussed:    Additional Comments:  Carles Collet, RN 12/13/2016, 11:42 AM

## 2016-12-13 NOTE — Discharge Summary (Signed)
Steven [name] FXJ:[id number] DOB: 1927-02-28 DOA: 12/08/2016  PCP: Briscoe Deutscher, MD  Admit date: 12/08/2016  Discharge date: 12/13/2016  Admitted From:  Home  Disposition:  Home   Recommendations for Outpatient Follow-up:   Follow up with PCP in 1-2 weeks  PCP Please obtain BMP/CBC, 2 view CXR in 1week,  (see Discharge instructions)   PCP Please follow up on the following pending results: Echo   Home Health:PT, RN   Equipment/Devices: None  Consultations: Cards, renal Discharge Condition: Fair   CODE STATUS: Full   Diet Recommendation: Diet renal/carb modified with fluid restriction 1.5lit/ day  Chief Complaint  Patient presents with  . Weakness  . Tachycardia     Brief history of present illness from the day of admission and additional interim summary    83 year old male with past medical history of hypertension, hyperlipidemia, ESRD, atrial fibrillation on Coumadin came to the ER with complaints of progressive shortness of breath after missing HD.Because of atrial fibrillation which has been persistently underwent TEE cardioversion.                                                                 Hospital Course    1. Acute hypoxic respiratory failure due to missed dialysis in a patient with ESRD causing acute on chronic diastolic CHF EF preserved at around 60%. Patient underwent dialysis with excessive fluid removal, resolution of acute on chronic diastolic heart failure and acute hypoxic respiratory failure. Back to baseline and eager to go home will be discharged home with outpatient PCP and cardiology follow-up post discharge. Seen and cleared by Cards for DC.  2. ESRD. Nephrology on board being dialyzed per routine he is on M, W, F schedule. He still takes oral Lasix which will be continued. Does  have indwelling Foley catheter which will be continued upon discharge, follows with nephrology and urology for the same.  3. Chronic atrial fibrillation. [place] vasc 2 score of at least 4. Had issues with rate control requiring TEE cardioversion, cardiology on board, he is currently on combination of amiodarone, beta blocker or rate control, also on Coumadin Coumadin.  Lab Results  Component Value Date   INR 3.26 12/13/2016   INR 2.84 12/12/2016   INR 2.35 12/11/2016     4. Anemia of chronic disease. Due to ESRD. Stable.  5. Hypothyroidism. Continue Synthroid.  Lab Results  Component Value Date   TSH 4.483 12/13/2016    6. Depression. On Remeron continue.  7. Dyslipidemia. On statin.  8. History of vitamin B-12 deficiency. Continue oral supplementation.   Discharge diagnosis     Active Problems:   Obesity (BMI 30-39.9)   Atrial fibrillation (HCC)   Hypertensive heart disease   Mitral valve disease   Long term current use of anticoagulant therapy  Hyperlipidemia   Shortness of breath   ESRD (end stage renal disease) (HCC)   Atrial fibrillation with rapid ventricular response (HCC)   ESRD on dialysis [facility])   Essential hypertension   Acute respiratory failure with hypoxia [facility])   Generalized weakness    Discharge instructions    Discharge Instructions    Discharge instructions    Complete by:  As directed    Follow with Primary MD Briscoe Deutscher, MD in 3 days   Get CBC, CMP, INR checked  by Primary MD in 3 days ( we routinely change or add medications that can affect your baseline labs and fluid status, therefore we recommend that you get the mentioned basic workup next visit with your PCP, your PCP may decide not to get them or add new tests based on their clinical decision)  Activity: As tolerated with Full fall precautions use walker/cane & assistance as needed  Disposition Home    Diet:   Diet renal/carb modified with fluid restriction  1.5lit/day.  For Heart failure patients - Check your Weight same time everyday, if you gain over 2 pounds, or you develop in leg swelling, experience more shortness of breath or chest pain, call your Primary MD immediately. Follow Cardiac Low Salt Diet and 1.5 lit/day fluid restriction.  On your next visit with your primary care physician please Get Medicines reviewed and adjusted.  Please request your Prim.MD to go over all Hospital Tests and Procedure/Radiological results at the follow up, please get all Hospital records sent to your Prim MD by signing hospital release before you go home.  If you experience worsening of your admission symptoms, develop shortness of breath, life threatening emergency, suicidal or homicidal thoughts you must seek medical attention immediately by calling 911 or calling your MD immediately  if symptoms less severe.  You Must read complete instructions/literature along with all the possible adverse reactions/side effects for all the Medicines you take and that have been prescribed to you. Take any new Medicines after you have completely understood and accpet all the possible adverse reactions/side effects.   Do not drive, operate heavy machinery, perform activities at heights, swimming or participation in water activities or provide baby sitting services if your were admitted for syncope or siezures until you have seen by Primary MD or a Neurologist and advised to do so again.  Do not drive when taking Pain medications.    Do not take more than prescribed Pain, Sleep and Anxiety Medications  Special Instructions: If you have smoked or chewed Tobacco  in the last 2 yrs please stop smoking, stop any regular Alcohol  and or any Recreational drug use.  Wear Seat belts while driving.   Please note  You were cared for by a hospitalist during your hospital stay. If you have any questions about your discharge medications or the care you received while you were in the  hospital after you are discharged, you can call the unit and asked to speak with the hospitalist on call if the hospitalist that took care of you is not available. Once you are discharged, your primary care physician will handle any further medical issues. Please note that NO REFILLS for any discharge medications will be authorized once you are discharged, as it is imperative that you return to your primary care physician (or establish a relationship with a primary care physician if you do not have one) for your aftercare needs so that they can reassess your need for medications and monitor your  lab values.   Increase activity slowly    Complete by:  As directed       Discharge Medications   Allergies as of 12/13/2016      Reactions   Hydrocodone Other (See Comments)   Reaction:  Confusion    Penicillins Hives, Other (See Comments)   Has patient had a PCN reaction causing immediate rash, facial/tongue/throat swelling, SOB or lightheadedness with hypotension: No Has patient had a PCN reaction causing severe rash involving mucus membranes or skin necrosis: No Has patient had a PCN reaction that required hospitalization No Has patient had a PCN reaction occurring within the last 10 years: No If all of the above answers are "NO", then may proceed with Cephalosporin use.      Medication List    TAKE these medications   acetaminophen 500 MG tablet Commonly known as:  TYLENOL Take 1,000 mg by mouth every 6 (six) hours as needed for mild pain, moderate pain, fever or headache.   allopurinol 100 MG tablet Commonly known as:  ZYLOPRIM Take 100 mg by mouth daily.   amiodarone 200 MG tablet Commonly known as:  PACERONE Take 200 mg by mouth daily.   atorvastatin 10 MG tablet Commonly known as:  LIPITOR Take 10 mg by mouth daily after supper.   diltiazem 240 MG 24 hr capsule Commonly known as:  CARDIZEM CD Take 1 capsule (240 mg total) by mouth daily.   furosemide 40 MG tablet Commonly  known as:  LASIX Take 40 mg by mouth See admin instructions. Take 1 tablet (40 mg) by mouth on Tuesday, Thursday, Saturday mornings   levothyroxine 50 MCG tablet Commonly known as:  SYNTHROID, LEVOTHROID Take 50 mcg by mouth daily.   medroxyPROGESTERone 5 MG tablet Commonly known as:  PROVERA Take 5 mg by mouth 2 (two) times daily.   metoprolol succinate 25 MG 24 hr tablet Commonly known as:  TOPROL-XL Take 25 mg by mouth daily after supper.   midodrine 10 MG tablet Commonly known as:  PROAMATINE Take 10 mg by mouth See admin instructions. Take 1 tablet (10 mg) by mouth on Monday, Wednesday, Friday mornings - before dialysis   mirtazapine 30 MG tablet Commonly known as:  REMERON Take 30 mg by mouth daily after supper.   multivitamin Tabs tablet Take 1 tablet by mouth daily.   sevelamer carbonate 800 MG tablet Commonly known as:  RENVELA Take 800 mg by mouth daily after supper.   tamsulosin 0.4 MG Caps capsule Commonly known as:  FLOMAX Take 0.4 mg by mouth daily.   vitamin B-12 1000 MCG tablet Commonly known as:  CYANOCOBALAMIN Take 1,000 mcg by mouth daily after supper.   warfarin 4 MG tablet Commonly known as:  COUMADIN Take 2 mg by mouth every evening.       Follow-up Information    Briscoe Deutscher, MD. Schedule an appointment as soon as possible for a visit in 3 day(s).   Specialty:  Family Medicine Contact information: [place] [address] [phone]        Jacolyn Reedy, MD. Schedule an appointment as soon as possible for a visit in 1 week(s).   Specialty:  Cardiology Contact information: [address] [place] [address] [phone]           Major procedures and Radiology Reports - PLEASE review detailed and final reports thoroughly  -         X-ray Chest Pa And  Lateral  Result Date: 12/10/2016 CLINICAL DATA:  Shortness of breath. EXAM: CHEST  2 VIEW COMPARISON:  12/08/2016. FINDINGS:  Cardiomegaly. Mild increase in basilar interstitial prominence. Mild CHF cannot be excluded. Low lung volumes with mild basilar atelectasis. Mild right base infiltrate cannot be excluded. No pneumothorax. IMPRESSION: 1. Cardiomegaly with mild increase in basilar interstitial prominence. Mild CHF cannot be excluded. 2. Low lung volumes with basilar atelectasis. Mild infiltrate right lung base cannot be excluded . Electronically Signed   By: Marcello Moores  Register   On: 12/10/2016 07:27   Dg Chest Port 1 View  Result Date: 12/08/2016 CLINICAL DATA:  Weakness, tachycardia, atrial fibrillation. EXAM: PORTABLE CHEST 1 VIEW COMPARISON:  09/27/2015. FINDINGS: Trachea is midline. Heart size is accentuated by AP technique and low lung volumes. Thoracic aorta is calcified. Bibasilar volume loss. Tiny left pleural effusion. No airspace consolidation. IMPRESSION: Bibasilar atelectasis with a tiny left pleural effusion. Electronically Signed   By: Lorin Picket M.D.   On: 12/08/2016 12:47    Micro Results     Recent Results (from the past 240 hour(s))  MRSA PCR Screening     Status: Abnormal   Collection Time: 12/08/16  6:04 PM  Result Value Ref Range Status   MRSA by PCR POSITIVE (A) NEGATIVE Final    Comment:        The GeneXpert MRSA Assay (FDA approved for NASAL specimens only), is one component of a comprehensive MRSA colonization surveillance program. It is not intended to diagnose MRSA infection nor to guide or monitor treatment for MRSA infections. RESULT CALLED TO, READ BACK BY AND VERIFIED WITHRaliegh Ip [facility] RN 2006 12/08/16 A BROWNING     Today   Subjective    Steven [name] today has no headache,no chest abdominal pain,no new weakness tingling or numbness, feels much better wants to go home today.     Objective   Blood pressure (!) 115/44, pulse 66, temperature 98.7 F (37.1 C), temperature source Oral, resp. rate 16, height 5\' 11"  (1.803 m), weight 114.5 kg (252 lb 6.8 oz), SpO2 95  %.   Intake/Output Summary (Last 24 hours) at 12/13/16 1111 Last data filed at 12/13/16 0601  Gross per 24 hour  Intake              360 ml  Output              500 ml  Net             -140 ml    Exam Awake Alert, Oriented x 3, No new F.N deficits, Normal affect [place].AT,PERRAL Supple Neck,No JVD, No cervical lymphadenopathy appriciated.  Symmetrical Chest wall movement, Good air movement bilaterally, CTAB iRRR,No Gallops,Rubs or new Murmurs, No Parasternal Heave +ve B.Sounds, Abd Soft, Non tender, No organomegaly appriciated, No rebound -guarding or rigidity. No Cyanosis, Clubbing or edema, No new Rash or bruise   Data Review   CBC w Diff: Lab Results  Component Value Date   WBC 6.2 12/12/2016   HGB 11.1 (L) 12/12/2016   HCT 35.9 (L) 12/12/2016   PLT 131 (L) 12/12/2016   LYMPHOPCT 28 10/29/2016   MONOPCT 9 10/29/2016   EOSPCT 7 10/29/2016   BASOPCT 0 10/29/2016    CMP: Lab Results  Component Value Date   NA 133 (L) 12/12/2016   K 3.9 12/12/2016   CL 96 (L) 12/12/2016   CO2 26 12/12/2016   BUN 22 (H) 12/12/2016   CREATININE 7.50 (H) 12/12/2016   PROT 5.5 (L)  12/09/2016   ALBUMIN 2.8 (L) 12/12/2016   BILITOT 0.5 12/09/2016   ALKPHOS 109 12/09/2016   AST 15 12/09/2016   ALT 6 (L) 12/09/2016  .   Total Time in preparing paper work, data evaluation and todays exam - 44 minutes  Lala Lund M.D on 12/13/2016 at 11:11 AM  [facility]   Office  [phone]

## 2016-12-13 NOTE — Discharge Instructions (Signed)
Follow with Primary MD Briscoe Deutscher, MD in 3 days   Get CBC, CMP, INR checked  by Primary MD in 3 days ( we routinely change or add medications that can affect your baseline labs and fluid status, therefore we recommend that you get the mentioned basic workup next visit with your PCP, your PCP may decide not to get them or add new tests based on their clinical decision)  Activity: As tolerated with Full fall precautions use walker/cane & assistance as needed  Disposition Home    Diet:   Diet renal/carb modified with fluid restriction 1.5lit/day.  For Heart failure patients - Check your Weight same time everyday, if you gain over 2 pounds, or you develop in leg swelling, experience more shortness of breath or chest pain, call your Primary MD immediately. Follow Cardiac Low Salt Diet and 1.5 lit/day fluid restriction.  On your next visit with your primary care physician please Get Medicines reviewed and adjusted.  Please request your Prim.MD to go over all Hospital Tests and Procedure/Radiological results at the follow up, please get all Hospital records sent to your Prim MD by signing hospital release before you go home.  If you experience worsening of your admission symptoms, develop shortness of breath, life threatening emergency, suicidal or homicidal thoughts you must seek medical attention immediately by calling 911 or calling your MD immediately  if symptoms less severe.  You Must read complete instructions/literature along with all the possible adverse reactions/side effects for all the Medicines you take and that have been prescribed to you. Take any new Medicines after you have completely understood and accpet all the possible adverse reactions/side effects.   Do not drive, operate heavy machinery, perform activities at heights, swimming or participation in water activities or provide baby sitting services if your were admitted for syncope or siezures until you have seen by Primary MD or  a Neurologist and advised to do so again.  Do not drive when taking Pain medications.    Do not take more than prescribed Pain, Sleep and Anxiety Medications  Special Instructions: If you have smoked or chewed Tobacco  in the last 2 yrs please stop smoking, stop any regular Alcohol  and or any Recreational drug use.  Wear Seat belts while driving.   Please note  You were cared for by a hospitalist during your hospital stay. If you have any questions about your discharge medications or the care you received while you were in the hospital after you are discharged, you can call the unit and asked to speak with the hospitalist on call if the hospitalist that took care of you is not available. Once you are discharged, your primary care physician will handle any further medical issues. Please note that NO REFILLS for any discharge medications will be authorized once you are discharged, as it is imperative that you return to your primary care physician (or establish a relationship with a primary care physician if you do not have one) for your aftercare needs so that they can reassess your need for medications and monitor your lab values.

## 2016-12-13 NOTE — Progress Notes (Signed)
Physical Therapy Treatment Patient Details Name: Steven [name] MRN: [id number] DOB: 1927/04/03 Today's Date: 12/13/2016    History of Present Illness Patient is a 83 y/o male who presents with complaints of progressive shortness of breath after missing HD. s/p cardioversion today. CXR-Low lung volumes with basilar atelectasis. Cardiomegaly with mild increase in basilar interstitial prominence. PMH includes HTN, HLD, ESRD, atrial fibrillation on Coumadin     PT Comments    Pt is able to perform bed mobs and transfers this session with decreased assistance. Pt does, however, continue to require supervision to min guard assist for safety with gait due to being impulsive. Pt continues to benefit from HHPT at discharge in order to maximize his functional outcomes.     Follow Up Recommendations  Home health PT;Supervision for mobility/OOB;Supervision/Assistance - 24 hour     Equipment Recommendations  None recommended by PT    Recommendations for Other Services       Precautions / Restrictions Precautions Precautions: Fall Restrictions Weight Bearing Restrictions: No    Mobility  Bed Mobility Overal bed mobility: Needs Assistance Bed Mobility: Supine to Sit     Supine to sit: Supervision     General bed mobility comments: Supervision for safety. HOB slightly elevated with use of bed rail. Pt able to scoot up in bed with use of bed rails and HOB flat.  Transfers Overall transfer level: Needs assistance Equipment used: Rolling walker (2 wheeled) Transfers: Sit to/from Stand Sit to Stand: Supervision         General transfer comment: for safety, no physical assist. Cues for hand placement and upright posture once in standing  Ambulation/Gait Ambulation/Gait assistance: Min guard Ambulation Distance (Feet): 10 Feet Assistive device: None Gait Pattern/deviations: Step-through pattern;Decreased step length - right;Decreased step length - left Gait velocity:  decreased Gait velocity interpretation: Below normal speed for age/gender General Gait Details: forward trunk lean with gait, step through sequencing   Stairs            Wheelchair Mobility    Modified Rankin (Stroke Patients Only)       Balance                                            Cognition Arousal/Alertness: Awake/alert Behavior During Therapy: WFL for tasks assessed/performed Overall Cognitive Status: Within Functional Limits for tasks assessed                                        Exercises      General Comments        Pertinent Vitals/Pain Pain Assessment: No/denies pain    Home Living                      Prior Function            PT Goals (current goals can now be found in the care plan section) Acute Rehab PT Goals Patient Stated Goal: go home Progress towards PT goals: Progressing toward goals    Frequency    Min 3X/week      PT Plan Current plan remains appropriate    Co-evaluation              AM-PAC PT "6 Clicks" Daily Activity  Outcome Measure  Difficulty turning  over in bed (including adjusting bedclothes, sheets and blankets)?: None Difficulty moving from lying on back to sitting on the side of the bed? : None Difficulty sitting down on and standing up from a chair with arms (e.g., wheelchair, bedside commode, etc,.)?: A Little Help needed moving to and from a bed to chair (including a wheelchair)?: A Little Help needed walking in hospital room?: A Little Help needed climbing 3-5 steps with a railing? : A Little 6 Click Score: 20    End of Session Equipment Utilized During Treatment: Gait belt Activity Tolerance: Patient tolerated treatment well Patient left: in chair;with call bell/phone within reach;Other (comment) (pharmacist in room) Nurse Communication: Mobility status PT Visit Diagnosis: Unsteadiness on feet (R26.81);Muscle weakness (generalized) (M62.81)      Time: 8270-7867 PT Time Calculation (min) (ACUTE ONLY): 10 min  Charges:  $Therapeutic Activity: 8-22 mins                    G Codes:       Scheryl Marten PT, DPT  [phone]    Jacqulyn Liner Sloan Leiter 12/13/2016, 12:35 PM

## 2016-12-13 NOTE — Progress Notes (Signed)
[place] for Warfarin  Indication: atrial fibrillation  Allergies  Allergen Reactions  . Hydrocodone Other (See Comments)    Reaction:  Confusion   . Penicillins Hives and Other (See Comments)    Has patient had a PCN reaction causing immediate rash, facial/tongue/throat swelling, SOB or lightheadedness with hypotension: No Has patient had a PCN reaction causing severe rash involving mucus membranes or skin necrosis: No Has patient had a PCN reaction that required hospitalization No Has patient had a PCN reaction occurring within the last 10 years: No If all of the above answers are "NO", then may proceed with Cephalosporin use.    Patient Measurements: Height: 5\' 11"  (180.3 cm) Weight: 252 lb 6.8 oz (114.5 kg) IBW/kg (Calculated) : 75.3  Vital Signs: Temp: 98.7 F (37.1 C) (05/19 0540) Temp Source: Oral (05/19 0540) BP: 115/44 (05/19 0540) Pulse Rate: 66 (05/19 0540)  Labs:  Recent Labs  12/10/16 1158 12/11/16 0251 12/12/16 0607 12/12/16 0731 12/12/16 0732 12/13/16 0458  HGB 11.1*  --   --   --  11.1*  --   HCT 35.9*  --   --   --  35.9*  --   PLT 147*  --   --   --  131*  --   LABPROT  --  26.1* 30.4*  --   --  33.9*  INR  --  2.35 2.84  --   --  3.26  CREATININE 5.18*  --   --  7.50*  --   --     Estimated Creatinine Clearance: 8.8 mL/min (A) (by C-G formula based on SCr of 7.5 mg/dL (H)).  Assessment: 83 yo male admitted with acute respiratory failure, on warfarin PTA for afib. Per note, DVT/PE is a possibility, V/Q scan ordered. Cardioversion on 5/17, but back in afib on 5/18 per notes.   INR today is slightly supratherapeutic (INR 2.84>3.26, goal of 2-3). Hgb low but stable - no bleeding noted at this time.   PTA warfarin dose 2 mg PO daily  Goal of Therapy:  INR 2-3 Monitor platelets by anticoagulation protocol: Yes   Plan:  1. Warfarin 1 mg PO x1 today  2. INR daily, CBC as needed 3. Monitor for s/s bleeding    Argie Ramming, PharmD Pharmacy Resident  Pager [phone] 12/13/16 8:21 AM

## 2016-12-13 NOTE — Progress Notes (Signed)
Subjective:  Eating brk  ,no cos, Tolerated HD yest on schedule,  Objective Vital signs in last 24 hours: Vitals:   12/12/16 1706 12/12/16 2213 12/13/16 0431 12/13/16 0540  BP: (!) 104/42 (!) 92/46  (!) 115/44  Pulse: 69 64  66  Resp: 18 16  16   Temp: 99.3 F (37.4 C) 97.8 F (36.6 C)  98.7 F (37.1 C)  TempSrc: Oral Oral  Oral  SpO2: 95% 100%  95%  Weight:  114.5 kg (252 lb 6.8 oz) 114.5 kg (252 lb 6.8 oz)   Height:       Weight change: -3.227 kg (-7 lb 1.8 oz)  Physical Exam: General: Elderly WM , NAd , Talkative, OX3 Heart: RRR (SR on Tele) No rub or mur Lungs: CTA Abdomen: Obese Soft BS +, NT, ND Extremities:Trace bipedal edema  Dialysis Access: Pos bruit AVF     OP Dialysis: [facility] MWF  2k, 2.5 ca Bath  EDW 216lb  =98kg ,Hep none . Hectorol  4mcg / L BA AVF /  3.5 hours  Problem/Plan: 1. Afib with RVR: had TEE cardioversion / in  NSR this am.  / noted per Dr. Wynonia Lawman  Followed by Cards with "Was planning cardiovsersion last summer but patient did not come back to office."" Coumadin as op  And in  [place]. 2. Volume - wts are inaccurate, got down 98 > 94 kg approx here, can lower dry wt to 95kg at dc 3. ESRD: HD MWF ( OP At [facility] ) on schedule now  4. HTN: cont MTP qhs, no chg, BP's good 115/44 this am  5. Anemia: Hgb 11.9.>11.1  No ESA needed yet. 6. Metabolic bone disease: Ca ok. Phos 4.1 with Renvela as binder./ Hectorol 2 mcg on HD  7. Urinary retention/Hx Prostate Ca: sp course of abx , foley cath put in within last few weeks, has f/u "bladder test" on May 24th and f/u urol appt with Dr Jeffie Pollock on May 31st. UA here dirty however no fevers here , urine cx not repeated 8. Dispo - per primary; ready from discharge from renal standpoint   Ernest Haber, PA-C [place] [phone] 12/13/2016,8:47 AM  LOS: 5 days   Pt seen, examined, agree w assess/plan as above with additions as indicated.  [name] MD [facility] pager  [phone]    cell [phone] 12/13/2016, 11:56 AM     Labs: Basic Metabolic Panel:  Recent Labs Lab 12/09/16 1225 12/10/16 1158 12/12/16 0731  NA 137 133* 133*  K 4.8 4.3 3.9  CL 101 95* 96*  CO2 25 27 26   GLUCOSE 95 87 88  BUN 32* 11 22*  CREATININE 10.18* 5.18* 7.50*  CALCIUM 8.2* 7.9* 8.0*  PHOS 3.3 2.3* 4.1   Liver Function Tests:  Recent Labs Lab 12/08/16 1959 12/09/16 0150 12/09/16 1225 12/10/16 1158 12/12/16 0731  AST 20 15  --   --   --   ALT 9* 6*  --   --   --   ALKPHOS 112 109  --   --   --   BILITOT 0.6 0.5  --   --   --   PROT 5.9* 5.5*  --   --   --   ALBUMIN 3.0* 2.8* 2.8* 2.7* 2.8*     Recent Labs Lab 12/08/16 1153  12/09/16 0150 12/09/16 1758 12/10/16 1158 12/12/16 0732  WBC 6.0  --  5.8 6.5 5.9 6.2  HGB 11.3*  < > 10.9* 12.3*  11.1* 11.1*  HCT 37.1*  < > 35.5* 39.1 35.9* 35.9*  MCV 95.9  --  95.2 94.7 94.2 93.2  PLT 156  --  157 143* 147* 131*  < > = values in this interval not displayed. CBG:  Recent Labs Lab 12/11/16 2137  GLUCAP 109*     Medications:  . allopurinol  100 mg Oral Daily  . amiodarone  200 mg Oral Daily  . atorvastatin  10 mg Oral QHS  . Chlorhexidine Gluconate Cloth  6 each Topical Q0600  . diltiazem  60 mg Oral Q8H  . furosemide  40 mg Oral Once per day on Mon Wed Fri  . levothyroxine  50 mcg Oral QAC breakfast  . metoprolol succinate  25 mg Oral QHS  . mirtazapine  30 mg Oral QHS  . multivitamin  1 tablet Oral QPM  . mupirocin ointment  1 application Nasal BID  . sevelamer carbonate  800 mg Oral Q supper  . vitamin B-12  1,000 mcg Oral QPM  . warfarin  1 mg Oral ONCE-1800  . Warfarin - Pharmacist Dosing Inpatient   Does not apply [phone]

## 2016-12-13 NOTE — Progress Notes (Signed)
Patient Discharge: Disposition: Patient discharged to home. Education: Reviewed all his medications, follow-up appointments, and discharge instructions, understood and acknowledged. IV: Discontinued IV before discharge. Telemetry: Discontinued Tele before discharge, CCMD notified. Transportation: Patient escorted out of the unit in w/c. Belongings: Patient took all his belongings with him.

## 2016-12-15 ENCOUNTER — Other Ambulatory Visit: Payer: Self-pay | Admitting: Cardiology

## 2016-12-15 DIAGNOSIS — I4891 Unspecified atrial fibrillation: Secondary | ICD-10-CM

## 2016-12-15 LAB — ECHOCARDIOGRAM COMPLETE
HEIGHTINCHES: 71 in
Weight: 4038.83 oz

## 2016-12-19 ENCOUNTER — Encounter (HOSPITAL_COMMUNITY): Payer: Self-pay | Admitting: Cardiovascular Disease

## 2016-12-19 NOTE — Addendum Note (Signed)
Addendum  created 12/19/16 0800 by Duane Boston, MD   Sign clinical note, SmartForm saved

## 2016-12-27 NOTE — Addendum Note (Signed)
Addendum  created 12/27/16 1019 by Duane Boston, MD   Sign clinical note

## 2016-12-27 NOTE — Addendum Note (Signed)
Addendum  created 12/27/16 1018 by Jarrette Dehner, MD   Sign clinical note    

## 2016-12-30 ENCOUNTER — Telehealth: Payer: Self-pay | Admitting: Oncology

## 2016-12-30 NOTE — Telephone Encounter (Signed)
Message from switchboard re patient calling for appointment. Returned call and spoke with Inez Catalina re f/u 6/7 @ 8:30 am.

## 2017-01-01 ENCOUNTER — Ambulatory Visit (HOSPITAL_BASED_OUTPATIENT_CLINIC_OR_DEPARTMENT_OTHER): Payer: Medicare Other | Admitting: Oncology

## 2017-01-01 ENCOUNTER — Telehealth: Payer: Self-pay | Admitting: Oncology

## 2017-01-01 VITALS — BP 93/58 | HR 97 | Temp 98.3°F | Resp 20 | Ht 71.0 in | Wt 215.0 lb

## 2017-01-01 DIAGNOSIS — M25569 Pain in unspecified knee: Secondary | ICD-10-CM | POA: Diagnosis not present

## 2017-01-01 DIAGNOSIS — E291 Testicular hypofunction: Secondary | ICD-10-CM | POA: Diagnosis not present

## 2017-01-01 DIAGNOSIS — K5900 Constipation, unspecified: Secondary | ICD-10-CM | POA: Diagnosis not present

## 2017-01-01 DIAGNOSIS — N186 End stage renal disease: Secondary | ICD-10-CM

## 2017-01-01 DIAGNOSIS — C61 Malignant neoplasm of prostate: Secondary | ICD-10-CM | POA: Diagnosis not present

## 2017-01-01 DIAGNOSIS — G8929 Other chronic pain: Secondary | ICD-10-CM

## 2017-01-01 DIAGNOSIS — Z992 Dependence on renal dialysis: Secondary | ICD-10-CM | POA: Diagnosis not present

## 2017-01-01 NOTE — Telephone Encounter (Signed)
Gave patient AVS and calender per 6/7 los - lab and f/u in 6 months.

## 2017-01-01 NOTE — Progress Notes (Signed)
Hematology and Oncology Follow Up Visit  Steven [name] [id number] 22-Oct-1926 83 y.o. 01/01/2017 8:34 AM Briscoe Deutscher, MDFried, Herbie [place], MD   Principle Diagnosis: 83 year old gentleman with prostate cancer diagnosed in July 2011. A Gleason score 4+4 = 8 and a PSA 15.3. He has castration resistant disease with biochemical relapse. He has very little measurable disease on imaging studies.   Prior Therapy: He was treated with androgen deprivation only with an excellent response initially. Most recently his PSA started to rise and in April 2016 his PSA was 11.83 with a testosterone of 12. His PSA in September 2015 was around 5.5. Imaging studies including a bone scan and CT scan did not show any evidence of measurable disease. PSA in March 2018 was 34 with testosterone of 17.2.   Current therapy: Androgen deprivation therapy and under consideration to start second line hormonal therapy.  Interim History: Steven [name] presents today for a follow-up visit. This is a pleasant gentleman I saw in consultation in May 2016. Since her last visit, he developed a rise in his PSA despite adequate castration therapy. His PSA in March 2018 was 34 and a testosterone of 17. Previous PSA in October 2017 was 20 and in September 2017 was 14. He remains hemodialysis-dependent and most recently hospitalized for weakness and tachycardia and respiratory failure. Since his discharge, he still rather weak and excessively fatigued from dialysis. He denied any back pain or pathological fractures.  He does not report any headaches, blurry vision, syncope or seizures. He does not report any fevers, chills, sweats or weight loss. He does not report any chest pain, palpitation. He does not report any orthopnea or increased leg edema. He does not report any cough or hemoptysis or wheezing. He does not report any nausea, vomiting, abdominal pain but does report occasional constipation. He does not report any hematochezia or melena. He  does not report any frequency, urgency or hematuria. He does report knee pain has been chronic and arthritic in nature. Remaining review of system is unremarkable.    Medications: I have reviewed the patient's current medications.  Current Outpatient Prescriptions  Medication Sig Dispense Refill  . acetaminophen (TYLENOL) 500 MG tablet Take 1,000 mg by mouth every 6 (six) hours as needed for mild pain, moderate pain, fever or headache.     . allopurinol (ZYLOPRIM) 100 MG tablet Take 100 mg by mouth daily.     Marland Kitchen amiodarone (PACERONE) 200 MG tablet Take 200 mg by mouth daily.    Marland Kitchen atorvastatin (LIPITOR) 10 MG tablet Take 10 mg by mouth daily after supper.     . diltiazem (CARDIZEM CD) 240 MG 24 hr capsule Take 1 capsule (240 mg total) by mouth daily. 30 capsule 0  . furosemide (LASIX) 40 MG tablet Take 40 mg by mouth See admin instructions. Take 1 tablet (40 mg) by mouth on Tuesday, Thursday, Saturday mornings    . levothyroxine (SYNTHROID, LEVOTHROID) 50 MCG tablet Take 50 mcg by mouth daily.     . medroxyPROGESTERone (PROVERA) 5 MG tablet Take 5 mg by mouth 2 (two) times daily.     . metoprolol succinate (TOPROL-XL) 25 MG 24 hr tablet Take 25 mg by mouth daily after supper.     . midodrine (PROAMATINE) 10 MG tablet Take 10 mg by mouth See admin instructions. Take 1 tablet (10 mg) by mouth on Monday, Wednesday, Friday mornings - before dialysis    . mirtazapine (REMERON) 30 MG tablet Take 30 mg by mouth daily after supper.     Marland Kitchen  multivitamin (RENA-VIT) TABS tablet Take 1 tablet by mouth daily.     . sevelamer carbonate (RENVELA) 800 MG tablet Take 800 mg by mouth daily after supper.     . tamsulosin (FLOMAX) 0.4 MG CAPS capsule Take 0.4 mg by mouth daily.    . vitamin B-12 (CYANOCOBALAMIN) 1000 MCG tablet Take 1,000 mcg by mouth daily after supper.     . warfarin (COUMADIN) 4 MG tablet Take 2 mg by mouth every evening.     No current facility-administered medications for this visit.       Allergies:  Allergies  Allergen Reactions  . Hydrocodone Other (See Comments)    Reaction:  Confusion   . Penicillins Hives and Other (See Comments)    Has patient had a PCN reaction causing immediate rash, facial/tongue/throat swelling, SOB or lightheadedness with hypotension: No Has patient had a PCN reaction causing severe rash involving mucus membranes or skin necrosis: No Has patient had a PCN reaction that required hospitalization No Has patient had a PCN reaction occurring within the last 10 years: No If all of the above answers are "NO", then may proceed with Cephalosporin use.    Past Medical History, Surgical history, Social history, and Family History were reviewed and updated.   Physical Exam: Blood pressure (!) 93/58, pulse 97, temperature 98.3 F (36.8 C), temperature source Oral, resp. rate 20, height 5\' 11"  (1.803 m), weight 215 lb (97.5 kg), SpO2 100 %.   ECOG: 2 General appearance: alert and cooperative appeared without distress. Head: Normocephalic, without obvious abnormality no oral thrush or ulcers. Neck: no adenopathy Lymph nodes: Cervical, supraclavicular, and axillary nodes normal. Heart:regular rate and rhythm, S1, S2 normal, no murmur, click, rub or gallop Lung:chest clear, no wheezing, rales, normal symmetric air entry Abdomin: soft, non-tender, without masses or organomegaly EXT:no erythema, induration, or nodules   Lab Results: Lab Results  Component Value Date   WBC 6.2 12/12/2016   HGB 11.1 (L) 12/12/2016   HCT 35.9 (L) 12/12/2016   MCV 93.2 12/12/2016   PLT 131 (L) 12/12/2016     Chemistry      Component Value Date/Time   NA 133 (L) 12/12/2016 0731   K 3.9 12/12/2016 0731   CL 96 (L) 12/12/2016 0731   CO2 26 12/12/2016 0731   BUN 22 (H) 12/12/2016 0731   CREATININE 7.50 (H) 12/12/2016 0731      Component Value Date/Time   CALCIUM 8.0 (L) 12/12/2016 0731   ALKPHOS 109 12/09/2016 0150   AST 15 12/09/2016 0150   ALT 6 (L)  12/09/2016 0150   BILITOT 0.5 12/09/2016 0150     EXAM: NUCLEAR MEDICINE WHOLE BODY BONE SCAN  TECHNIQUE: Whole body anterior and posterior images were obtained approximately 3 hours after intravenous injection of radiopharmaceutical.  RADIOPHARMACEUTICALS:  20.2 mCi Technetium-71m MDP IV  COMPARISON:  11/02/2014 nuclear medicine bone scan. 07/15/2016 CT pelvis.  FINDINGS: Diminished uptake of radiotracer by the kidneys is consistent with history of stage 4 chronic kidney disease. Photopenic areas are again seen at the knees related to prior arthroplasties. Uptake about the shoulders, hips, right greater than left wrists, and ankles is likely degenerative. Uptake throughout the remainder of the skeleton is symmetric and unchanged from the prior study, without focal abnormality to suggest osseous metastatic disease. Somewhat coarse appearance of tracer uptake throughout the ribs is similar to multiple prior studies. Mild lumbar levoscoliosis is noted.  IMPRESSION: Unchanged examination without evidence of osseous metastatic disease.   Impression and Plan:  83 year old gentleman with the following issues:  1. Prostate cancer diagnosed in July 2011 with a Gleason score 4+4 = 8 and a PSA of 15.3. He was treated with androgen deprivation only with an excellent response initially. Most recently his PSA started to rise and in April 2016 his PSA was 11.83 with a testosterone of 12. His PSA in September 2015 was around 5.5.   PSA has been rising and the last 2 years with PSA in March 2018 was 34 with a doubling time of close to 6 months.  Risks and benefits of starting second line hormonal therapy to include Zytiga or Xtandi were discussed today. Complications from these medications that include fatigue, nausea, edema, electrolyte imbalance, cardiac toxicity among others. Given his low volume disease and other comorbidities and limited life expectancy because of these  comorbidities I have recommended observation and surveillance for the time being. He understands she develops rapidly progressing prostate cancer that is symptomatic, we can consider salvage therapy at this time.  I feel the benefit associated with these treatment do not justify the complications associated with it.   2. End-stage renal disease: He is currently on hemodialysis seems to be well tolerated.  3. Follow-up: In 6 months sooner if needed to.  [facility], MD 6/7/20188:34 AM

## 2017-05-08 ENCOUNTER — Other Ambulatory Visit: Payer: Self-pay | Admitting: Urology

## 2017-05-08 DIAGNOSIS — Z192 Hormone resistant malignancy status: Secondary | ICD-10-CM

## 2017-05-08 DIAGNOSIS — C775 Secondary and unspecified malignant neoplasm of intrapelvic lymph nodes: Secondary | ICD-10-CM

## 2017-05-19 ENCOUNTER — Encounter (HOSPITAL_COMMUNITY)
Admission: RE | Admit: 2017-05-19 | Discharge: 2017-05-19 | Disposition: A | Discharge status: Home / Self Care | Payer: Medicare Other | Source: Ambulatory Visit | Attending: Urology | Admitting: Urology

## 2017-05-19 DIAGNOSIS — C775 Secondary and unspecified malignant neoplasm of intrapelvic lymph nodes: Secondary | ICD-10-CM

## 2017-05-19 DIAGNOSIS — Z192 Hormone resistant malignancy status: Secondary | ICD-10-CM | POA: Diagnosis not present

## 2017-05-19 MED ORDER — TECHNETIUM TC 99M MEDRONATE IV KIT
25.0000 | PACK | Freq: Once | INTRAVENOUS | Status: AC | PRN
  Administered 2017-05-19: 21.1 via INTRAVENOUS

## 2017-06-26 ENCOUNTER — Emergency Department (HOSPITAL_COMMUNITY): Payer: Medicare Other

## 2017-06-26 ENCOUNTER — Inpatient Hospital Stay (HOSPITAL_COMMUNITY)
Admission: EM | Admit: 2017-06-26 | Discharge: 2017-07-04 | DRG: 308 | Disposition: A | Discharge status: Skilled Nursing Facility | Payer: Medicare Other | Attending: Internal Medicine | Admitting: Internal Medicine

## 2017-06-26 ENCOUNTER — Other Ambulatory Visit: Payer: Self-pay

## 2017-06-26 ENCOUNTER — Encounter (HOSPITAL_COMMUNITY): Payer: Self-pay | Admitting: *Deleted

## 2017-06-26 DIAGNOSIS — M1990 Unspecified osteoarthritis, unspecified site: Secondary | ICD-10-CM | POA: Diagnosis present

## 2017-06-26 DIAGNOSIS — E871 Hypo-osmolality and hyponatremia: Secondary | ICD-10-CM | POA: Diagnosis present

## 2017-06-26 DIAGNOSIS — I119 Hypertensive heart disease without heart failure: Secondary | ICD-10-CM | POA: Diagnosis present

## 2017-06-26 DIAGNOSIS — N400 Enlarged prostate without lower urinary tract symptoms: Secondary | ICD-10-CM | POA: Diagnosis present

## 2017-06-26 DIAGNOSIS — Z992 Dependence on renal dialysis: Secondary | ICD-10-CM

## 2017-06-26 DIAGNOSIS — R402362 Coma scale, best motor response, obeys commands, at arrival to emergency department: Secondary | ICD-10-CM | POA: Diagnosis present

## 2017-06-26 DIAGNOSIS — Z66 Do not resuscitate: Secondary | ICD-10-CM | POA: Diagnosis present

## 2017-06-26 DIAGNOSIS — R402252 Coma scale, best verbal response, oriented, at arrival to emergency department: Secondary | ICD-10-CM | POA: Diagnosis present

## 2017-06-26 DIAGNOSIS — E785 Hyperlipidemia, unspecified: Secondary | ICD-10-CM | POA: Diagnosis present

## 2017-06-26 DIAGNOSIS — I9589 Other hypotension: Secondary | ICD-10-CM | POA: Diagnosis present

## 2017-06-26 DIAGNOSIS — I132 Hypertensive heart and chronic kidney disease with heart failure and with stage 5 chronic kidney disease, or end stage renal disease: Secondary | ICD-10-CM | POA: Diagnosis present

## 2017-06-26 DIAGNOSIS — R531 Weakness: Secondary | ICD-10-CM | POA: Diagnosis not present

## 2017-06-26 DIAGNOSIS — N2581 Secondary hyperparathyroidism of renal origin: Secondary | ICD-10-CM | POA: Diagnosis present

## 2017-06-26 DIAGNOSIS — E039 Hypothyroidism, unspecified: Secondary | ICD-10-CM | POA: Diagnosis present

## 2017-06-26 DIAGNOSIS — C61 Malignant neoplasm of prostate: Secondary | ICD-10-CM | POA: Diagnosis present

## 2017-06-26 DIAGNOSIS — R339 Retention of urine, unspecified: Secondary | ICD-10-CM | POA: Diagnosis present

## 2017-06-26 DIAGNOSIS — Z885 Allergy status to narcotic agent status: Secondary | ICD-10-CM

## 2017-06-26 DIAGNOSIS — F329 Major depressive disorder, single episode, unspecified: Secondary | ICD-10-CM | POA: Diagnosis present

## 2017-06-26 DIAGNOSIS — E669 Obesity, unspecified: Secondary | ICD-10-CM | POA: Diagnosis present

## 2017-06-26 DIAGNOSIS — M898X9 Other specified disorders of bone, unspecified site: Secondary | ICD-10-CM | POA: Diagnosis present

## 2017-06-26 DIAGNOSIS — N186 End stage renal disease: Secondary | ICD-10-CM | POA: Diagnosis present

## 2017-06-26 DIAGNOSIS — D649 Anemia, unspecified: Secondary | ICD-10-CM | POA: Diagnosis present

## 2017-06-26 DIAGNOSIS — Z96653 Presence of artificial knee joint, bilateral: Secondary | ICD-10-CM | POA: Diagnosis present

## 2017-06-26 DIAGNOSIS — E559 Vitamin D deficiency, unspecified: Secondary | ICD-10-CM | POA: Diagnosis present

## 2017-06-26 DIAGNOSIS — I509 Heart failure, unspecified: Secondary | ICD-10-CM | POA: Diagnosis present

## 2017-06-26 DIAGNOSIS — M109 Gout, unspecified: Secondary | ICD-10-CM | POA: Diagnosis present

## 2017-06-26 DIAGNOSIS — I4891 Unspecified atrial fibrillation: Principal | ICD-10-CM | POA: Diagnosis present

## 2017-06-26 DIAGNOSIS — Z7189 Other specified counseling: Secondary | ICD-10-CM

## 2017-06-26 DIAGNOSIS — Z6831 Body mass index (BMI) 31.0-31.9, adult: Secondary | ICD-10-CM

## 2017-06-26 DIAGNOSIS — Z7901 Long term (current) use of anticoagulants: Secondary | ICD-10-CM

## 2017-06-26 DIAGNOSIS — E860 Dehydration: Secondary | ICD-10-CM | POA: Diagnosis present

## 2017-06-26 DIAGNOSIS — R402142 Coma scale, eyes open, spontaneous, at arrival to emergency department: Secondary | ICD-10-CM | POA: Diagnosis present

## 2017-06-26 DIAGNOSIS — Z880 Allergy status to penicillin: Secondary | ICD-10-CM

## 2017-06-26 DIAGNOSIS — Z79890 Hormone replacement therapy (postmenopausal): Secondary | ICD-10-CM

## 2017-06-26 DIAGNOSIS — Z515 Encounter for palliative care: Secondary | ICD-10-CM

## 2017-06-26 LAB — COMPREHENSIVE METABOLIC PANEL
ALBUMIN: 3.1 g/dL — AB (ref 3.5–5.0)
ALK PHOS: 498 U/L — AB (ref 38–126)
ALT: 9 U/L — AB (ref 17–63)
AST: 25 U/L (ref 15–41)
Anion gap: 17 — ABNORMAL HIGH (ref 5–15)
BUN: 26 mg/dL — ABNORMAL HIGH (ref 6–20)
CALCIUM: 8.5 mg/dL — AB (ref 8.9–10.3)
CHLORIDE: 92 mmol/L — AB (ref 101–111)
CO2: 24 mmol/L (ref 22–32)
CREATININE: 7.46 mg/dL — AB (ref 0.61–1.24)
GFR calc Af Amer: 7 mL/min — ABNORMAL LOW (ref >60)
GFR calc non Af Amer: 6 mL/min — ABNORMAL LOW (ref >60)
GLUCOSE: 99 mg/dL (ref 65–99)
Potassium: 4.7 mmol/L (ref 3.5–5.1)
SODIUM: 133 mmol/L — AB (ref 135–145)
Total Bilirubin: 0.7 mg/dL (ref 0.3–1.2)
Total Protein: 6.9 g/dL (ref 6.5–8.1)

## 2017-06-26 LAB — CBG MONITORING, ED: GLUCOSE-CAPILLARY: 100 mg/dL — AB (ref 65–99)

## 2017-06-26 LAB — CBC
HCT: 40.2 % (ref 39.0–52.0)
Hemoglobin: 12.7 g/dL — ABNORMAL LOW (ref 13.0–17.0)
MCH: 30.5 pg (ref 26.0–34.0)
MCHC: 31.6 g/dL (ref 30.0–36.0)
MCV: 96.4 fL (ref 78.0–100.0)
Platelets: 161 10*3/uL (ref 150–400)
RBC: 4.17 MIL/uL — ABNORMAL LOW (ref 4.22–5.81)
RDW: 17.1 % — AB (ref 11.5–15.5)
WBC: 8.3 10*3/uL (ref 4.0–10.5)

## 2017-06-26 LAB — URINALYSIS, ROUTINE W REFLEX MICROSCOPIC
BILIRUBIN URINE: NEGATIVE
Glucose, UA: NEGATIVE mg/dL
HGB URINE DIPSTICK: NEGATIVE
KETONES UR: NEGATIVE mg/dL
Nitrite: NEGATIVE
PH: 7 (ref 5.0–8.0)
Protein, ur: >=300 mg/dL — AB
RBC / HPF: NONE SEEN RBC/hpf (ref 0–5)
SQUAMOUS EPITHELIAL / LPF: NONE SEEN
Specific Gravity, Urine: 1.019 (ref 1.005–1.030)

## 2017-06-26 LAB — I-STAT TROPONIN, ED: Troponin i, poc: 0.06 ng/mL (ref 0.00–0.08)

## 2017-06-26 LAB — PROTIME-INR
INR: 2.22
Prothrombin Time: 24.4 seconds — ABNORMAL HIGH (ref 11.4–15.2)

## 2017-06-26 LAB — TSH: TSH: 4.496 u[IU]/mL (ref 0.350–4.500)

## 2017-06-26 LAB — HEMOGLOBIN A1C
Hgb A1c MFr Bld: 5.2 % (ref 4.8–5.6)
Mean Plasma Glucose: 102.54 mg/dL

## 2017-06-26 MED ORDER — AMIODARONE HCL 200 MG PO TABS
200.0000 mg | ORAL_TABLET | Freq: Every day | ORAL | Status: DC
  Administered 2017-06-26 – 2017-07-04 (×9): 200 mg via ORAL
  Filled 2017-06-26 (×9): qty 1

## 2017-06-26 MED ORDER — RENA-VITE PO TABS
1.0000 | ORAL_TABLET | Freq: Every day | ORAL | Status: DC
  Administered 2017-06-27 – 2017-07-04 (×8): 1 via ORAL
  Filled 2017-06-26 (×10): qty 1

## 2017-06-26 MED ORDER — MIRTAZAPINE 30 MG PO TABS
30.0000 mg | ORAL_TABLET | Freq: Every day | ORAL | Status: DC
  Administered 2017-06-26 – 2017-07-03 (×8): 30 mg via ORAL
  Filled 2017-06-26 (×8): qty 1

## 2017-06-26 MED ORDER — ONDANSETRON HCL 4 MG/2ML IJ SOLN: 4.0000 mg | Freq: Four times a day (QID) | INTRAMUSCULAR | Status: DC | PRN

## 2017-06-26 MED ORDER — MEDROXYPROGESTERONE ACETATE 2.5 MG PO TABS
5.0000 mg | ORAL_TABLET | Freq: Two times a day (BID) | ORAL | Status: DC
  Administered 2017-06-26 – 2017-07-04 (×16): 5 mg via ORAL
  Filled 2017-06-26 (×17): qty 2

## 2017-06-26 MED ORDER — LEVOTHYROXINE SODIUM 50 MCG PO TABS
50.0000 ug | ORAL_TABLET | Freq: Every day | ORAL | Status: DC
  Administered 2017-06-27 – 2017-07-04 (×8): 50 ug via ORAL
  Filled 2017-06-26 (×8): qty 1

## 2017-06-26 MED ORDER — POLYETHYLENE GLYCOL 3350 17 G PO PACK: 17.0000 g | PACK | Freq: Every day | ORAL | Status: DC | PRN

## 2017-06-26 MED ORDER — SEVELAMER CARBONATE 800 MG PO TABS
800.0000 mg | ORAL_TABLET | Freq: Every day | ORAL | Status: DC
  Administered 2017-06-26 – 2017-07-03 (×7): 800 mg via ORAL
  Filled 2017-06-26 (×8): qty 1

## 2017-06-26 MED ORDER — MIDODRINE HCL 5 MG PO TABS
10.0000 mg | ORAL_TABLET | Freq: Two times a day (BID) | ORAL | Status: DC
  Administered 2017-06-27: 10 mg via ORAL
  Filled 2017-06-26: qty 2

## 2017-06-26 MED ORDER — WARFARIN SODIUM 2 MG PO TABS
2.0000 mg | ORAL_TABLET | Freq: Every evening | ORAL | Status: DC
  Administered 2017-06-26 – 2017-07-03 (×8): 2 mg via ORAL
  Filled 2017-06-26 (×10): qty 1

## 2017-06-26 MED ORDER — ATORVASTATIN CALCIUM 10 MG PO TABS
10.0000 mg | ORAL_TABLET | Freq: Every day | ORAL | Status: DC
  Administered 2017-06-26 – 2017-07-03 (×8): 10 mg via ORAL
  Filled 2017-06-26 (×8): qty 1

## 2017-06-26 MED ORDER — ORAL CARE MOUTH RINSE
15.0000 mL | Freq: Two times a day (BID) | OROMUCOSAL | Status: DC
  Administered 2017-06-27 – 2017-07-03 (×12): 15 mL via OROMUCOSAL

## 2017-06-26 MED ORDER — WARFARIN - PHYSICIAN DOSING INPATIENT
Freq: Every day | Status: DC
  Administered 2017-06-29 – 2017-07-01 (×3)

## 2017-06-26 MED ORDER — DILTIAZEM HCL ER COATED BEADS 240 MG PO CP24
240.0000 mg | ORAL_CAPSULE | Freq: Every day | ORAL | Status: DC
  Administered 2017-06-26 – 2017-07-04 (×7): 240 mg via ORAL
  Filled 2017-06-26 (×9): qty 1

## 2017-06-26 MED ORDER — SODIUM CHLORIDE 0.9 % IV SOLN
INTRAVENOUS | Status: DC
  Administered 2017-06-26: 20:00:00 via INTRAVENOUS

## 2017-06-26 MED ORDER — DILTIAZEM HCL 25 MG/5ML IV SOLN
5.0000 mg | Freq: Once | INTRAVENOUS | Status: AC
  Administered 2017-06-26: 5 mg via INTRAVENOUS

## 2017-06-26 MED ORDER — ALLOPURINOL 100 MG PO TABS
100.0000 mg | ORAL_TABLET | Freq: Every day | ORAL | Status: DC
  Administered 2017-06-27 – 2017-07-04 (×8): 100 mg via ORAL
  Filled 2017-06-26 (×9): qty 1

## 2017-06-26 MED ORDER — SODIUM CHLORIDE 0.9% FLUSH
3.0000 mL | Freq: Two times a day (BID) | INTRAVENOUS | Status: DC
  Administered 2017-06-27: 3 mL via INTRAVENOUS
  Administered 2017-06-27: 2 mL via INTRAVENOUS
  Administered 2017-06-28 – 2017-07-04 (×13): 3 mL via INTRAVENOUS

## 2017-06-26 MED ORDER — ONDANSETRON HCL 4 MG PO TABS: 4.0000 mg | ORAL_TABLET | Freq: Four times a day (QID) | ORAL | Status: DC | PRN

## 2017-06-26 MED ORDER — ACETAMINOPHEN 500 MG PO TABS
1000.0000 mg | ORAL_TABLET | Freq: Four times a day (QID) | ORAL | Status: DC | PRN
  Administered 2017-06-30 – 2017-07-04 (×6): 1000 mg via ORAL
  Filled 2017-06-26 (×6): qty 2

## 2017-06-26 MED ORDER — DILTIAZEM HCL 25 MG/5ML IV SOLN
5.0000 mg | Freq: Once | INTRAVENOUS | Status: AC
  Administered 2017-06-26: 5 mg via INTRAVENOUS
  Filled 2017-06-26: qty 5

## 2017-06-26 MED ORDER — OXYCODONE HCL 5 MG PO TABS: 5.0000 mg | ORAL_TABLET | ORAL | Status: DC | PRN

## 2017-06-26 MED ORDER — TAMSULOSIN HCL 0.4 MG PO CAPS
0.4000 mg | ORAL_CAPSULE | Freq: Every day | ORAL | Status: DC
  Administered 2017-06-26 – 2017-06-27 (×2): 0.4 mg via ORAL
  Filled 2017-06-26 (×2): qty 1

## 2017-06-26 MED ORDER — VITAMIN B-12 1000 MCG PO TABS
1000.0000 ug | ORAL_TABLET | Freq: Every day | ORAL | Status: DC
  Administered 2017-06-26 – 2017-07-03 (×8): 1000 ug via ORAL
  Filled 2017-06-26 (×8): qty 1

## 2017-06-26 NOTE — ED Notes (Signed)
Tried standing pt on side of bed, pt began to complain about how he couldn't stand. This NT asked pt if he could stand up just for a minute, pt attempted to stand up and sat back down. Pt tried standing x2, pt complained about being dizzy once and the second attempt pt complained about leg. Notified Josh(PA)

## 2017-06-26 NOTE — Care Management Obs Status (Signed)
MEDICARE OBSERVATION STATUS NOTIFICATION   Patient Details  Name: Steven [name] MRN: [id number] Date of Birth: 01-18-1927   Medicare Observation Status Notification Given:       Laurena Slimmer, RN 06/26/2017, 6:08 PM

## 2017-06-26 NOTE — H&P (Signed)
History and Physical    Steven [name] XNA:[id number] DOB: [birth year] DOA: 06/26/2017  PCP: Orpah Melter, MD  Patient coming from: Home via EMS  I have personally briefly reviewed patient's old medical records in [place]  Chief Complaint: Weakness and fall today.  Weakness has been present for a month.  HPI: Steven [name] is a 83 y.o. male with medical history significant hypertension, hyperlipidemia, end-stage renal disease on hemodialysis Monday Wednesdays and Fridays, atrial fibrillation on Coumadin, who presents to the ED with a complaint of weakness for about a month and falling this morning.  Patient has a Foley in place because he continues to make some urine.  He also has a history of hypertensive heart disease and takes Lasix.  Today the patient got up from his couch to go to the bathroom and he fell on the floor.  An ambulance was called.  He denies any focal weakness but does complain of generalized weakness.  He has had some headaches which have been unusual for him he usually does not have these.  He complains of a baseline cough but denies any fevers vomiting or diarrhea.  He has no skin rashes.  He has chronic lower extremity edema and states that his previous dialysis session was normal.  Symptoms came on acutely this.  But the course has been insidious with the weakness.  ED Course: Patient was found to be unable to stand or ambulate.  It was noted that he has a nurse who comes to his house once a month change of catheter and he has 2 family friends who live nearby and are elderly who assist him with some tasks but are unable to care for him long-term.  He was found to be in atrial fibrillation with rapid response and IV Cardizem was given without much response.  His EKG remained with an atrial fibrillation at a heart rate of 125.  Given his recent profound weakness inability to care for himself and uncontrolled atrial fibrillation the patient will be admitted into the  hospital for further evaluation and management.  Review of Systems: As per HPI otherwise 10 point review of systems negative.   ROS Past Medical History:  Diagnosis Date  . Anemia   . Atrial fibrillation ([place])   . BPH (benign prostatic hyperplasia)   . CKD (chronic kidney disease), stage IV ([place])   . Gout   . HX: long term anticoagulant use   . Hypercalcemia   . Hypertension   . Kidney stone   . Mitral valve disorders(424.0)   . Osteoarthritis   . Prostate CA ([place])   . PVC (premature ventricular contraction)   . Vitamin D deficiency     Past Surgical History:  Procedure Laterality Date  . CARDIOVERSION N/A 12/11/2016   Procedure: CARDIOVERSION;  Surgeon: Josue Hector, MD;  Location: [facility] ENDOSCOPY;  Service: Cardiovascular;  Laterality: N/A;  . EYE SURGERY Bilateral    cataract extraction with IOL  . JOINT REPLACEMENT Left 2012   knee  . KIDNEY STONE SURGERY    . SP AV DIALYSIS SHUNT ACCESS EXISTING *L* Left    no dialysis  . TEE WITHOUT CARDIOVERSION N/A 12/11/2016   Procedure: TRANSESOPHAGEAL ECHOCARDIOGRAM (TEE);  Surgeon: Josue Hector, MD;  Location: [facility] END[facility]COPY;  Service: Cardiovascular;  Laterality: N/A;  . TOTAL KNEE ARTHROPLASTY Right 06/06/2013   Procedure: RIGHT TOTAL KNEE ARTHROPLASTY;  Surgeon: Mauri Pole, MD;  Location: [facility] ORS;  Service: Orthopedics;  Laterality: Right;  reports that he is a non-smoker but has been exposed to tobacco smoke. He has quit using smokeless tobacco. He reports that he drinks alcohol. He reports that he does not use drugs.  Allergies  Allergen Reactions  . Hydrocodone Other (See Comments)    Reaction:  Confusion   . Methocarbamol Other (See Comments)    Reported by [facility] 03/06/17 - unknown reaction  . Penicillins Hives and Other (See Comments)    Has patient had a PCN reaction causing immediate rash, facial/tongue/throat swelling, SOB or lightheadedness with hypotension: No Has patient had a PCN reaction  causing severe rash involving mucus membranes or skin necrosis: No Has patient had a PCN reaction that required hospitalization No Has patient had a PCN reaction occurring within the last 10 years: No If all of the above answers are "NO", then may proceed with Cephalosporin use.    History reviewed. No pertinent family history.   Prior to Admission medications   Medication Sig Start Date End Date Taking? Authorizing Provider  acetaminophen (TYLENOL) 500 MG tablet Take 1,000 mg by mouth every 6 (six) hours as needed for mild pain, moderate pain, fever or headache.    Yes [provider]  allopurinol (ZYLOPRIM) 100 MG tablet Take 100 mg by mouth daily.    Yes [provider]  amiodarone (PACERONE) 200 MG tablet Take 200 mg by mouth daily.   Yes [provider]  atorvastatin (LIPITOR) 10 MG tablet Take 10 mg by mouth daily after supper.    Yes [provider]  diltiazem (CARDIZEM CD) 240 MG 24 hr capsule Take 1 capsule (240 mg total) by mouth daily. Patient taking differently: Take 240 mg by mouth See admin instructions. Take 1 capsule (240 mg) by mouth on Sunday, Tuesday, Thursday, Saturday morning (non-dialysis days) 06/14/13  Yes Babish, Rodman Key, PA-C  furosemide (LASIX) 40 MG tablet Take 40 mg by mouth See admin instructions. Take 1 tablet (40 mg) by mouth on Tuesday, Thursday, Saturday mornings   Yes [provider]  levothyroxine (SYNTHROID, LEVOTHROID) 50 MCG tablet Take 50 mcg by mouth daily.    Yes [provider]  medroxyPROGESTERone (PROVERA) 5 MG tablet Take 5 mg by mouth 2 (two) times daily.    Yes [provider]  Melatonin 3 MG TABS Take 3 mg by mouth at bedtime.   Yes [provider]  metoprolol succinate (TOPROL-XL) 25 MG 24 hr tablet Take 25 mg by mouth See admin instructions. Take 1 tablet (25 mg) by mouth on Sunday, Tuesday, Thursday, Saturday mornings (non-dialysis days)   Yes [provider]    midodrine (PROAMATINE) 10 MG tablet Take 10 mg by mouth See admin instructions. Take 1 tablet (10 mg) by mouth twice daily - morning and lunch   Yes [provider]  mirtazapine (REMERON) 30 MG tablet Take 30 mg by mouth daily after supper.    Yes [provider]  multivitamin (RENA-VIT) TABS tablet Take 1 tablet by mouth daily.    Yes [provider]  sevelamer carbonate (RENVELA) 800 MG tablet Take 800 mg by mouth daily with breakfast.    Yes [provider]  tamsulosin (FLOMAX) 0.4 MG CAPS capsule Take 0.4 mg by mouth daily.   Yes [provider]  vitamin B-12 (CYANOCOBALAMIN) 500 MCG tablet Take 500 mcg by mouth daily after supper.   Yes [provider]  warfarin (COUMADIN) 4 MG tablet Take 2 mg by mouth daily after supper.  Yes [provider]    Physical Exam: Vitals:   06/26/17 1645 06/26/17 1700 06/26/17 1730 06/26/17 1800  BP: 109/70 100/60 (!) 94/57 97/62  Pulse: (!) 102 (!) 118 (!) 116 (!) 125  Resp:      Temp:      TempSrc:      SpO2: (!) 88% 97% 97% 98%    Constitutional: NAD, calm, comfortable Vitals:   06/26/17 1645 06/26/17 1700 06/26/17 1730 06/26/17 1800  BP: 109/70 100/60 (!) 94/57 97/62  Pulse: (!) 102 (!) 118 (!) 116 (!) 125  Resp:      Temp:      TempSrc:      SpO2: (!) 88% 97% 97% 98%   Eyes: PERRL, lids and conjunctivae normal ENMT: Mucous membranes are moist. Posterior pharynx clear of any exudate or lesions.Normal dentition.  Neck: normal, supple, no masses, no thyromegaly Respiratory: clear to auscultation bilaterally, no wheezing, mild crackles. Normal respiratory effort. No accessory muscle use.  Cardiovascular: Regular rate and rhythm, no murmurs / rubs / gallops.  4+ edema of the bilateral lower extremities. 2+ pedal pulses. No carotid bruits.  Abdomen: no tenderness, no masses palpated. No hepatosplenomegaly. Bowel sounds positive.  Foley catheter in place  chronically. Musculoskeletal: no clubbing / cyanosis. No joint deformity upper and lower extremities. Good ROM, no contractures. Normal muscle tone.  Unable to stand at the edge of the bed Skin: no rashes, lesions, ulcers. No induration Neurologic: CN 2-12 grossly intact. Sensation intact, DTR normal. Strength 5/5 in all 4.  Psychiatric: Normal judgment and insight. Alert and oriented x 3. Normal mood.     Labs on Admission: I have personally reviewed following labs and imaging studies  CBC: Recent Labs  Lab 06/26/17 1057  WBC 8.3  HGB 12.7*  HCT 40.2  MCV 96.4  PLT 539   Basic Metabolic Panel: Recent Labs  Lab 06/26/17 1057  NA 133*  K 4.7  CL 92*  CO2 24  GLUCOSE 99  BUN 26*  CREATININE 7.46*  CALCIUM 8.5*   GFR: CrCl cannot be calculated (Unknown ideal weight.). Liver Function Tests: Recent Labs  Lab 06/26/17 1057  AST 25  ALT 9*  ALKPHOS 498*  BILITOT 0.7  PROT 6.9  ALBUMIN 3.1*   No results for input(s): LIPASE, AMYLASE in the last 168 hours. No results for input(s): AMMONIA in the last 168 hours. Coagulation Profile: Recent Labs  Lab 06/26/17 1057  INR 2.22   Cardiac Enzymes: No results for input(s): CKTOTAL, CKMB, CKMBINDEX, TROPONINI in the last 168 hours. BNP (last 3 results) No results for input(s): PROBNP in the last 8760 hours. HbA1C: No results for input(s): HGBA1C in the last 72 hours. CBG: Recent Labs  Lab 06/26/17 1102  GLUCAP 100*   Lipid Profile: No results for input(s): CHOL, HDL, LDLCALC, TRIG, CHOLHDL, LDLDIRECT in the last 72 hours. Thyroid Function Tests: No results for input(s): TSH, T4TOTAL, FREET4, T3FREE, THYROIDAB in the last 72 hours. Anemia Panel: No results for input(s): VITAMINB12, FOLATE, FERRITIN, TIBC, IRON, RETICCTPCT in the last 72 hours. Urine analysis:    Component Value Date/Time   COLORURINE AMBER (A) 06/26/2017 1623   APPEARANCEUR TURBID (A) 06/26/2017 1623   LABSPEC 1.019 06/26/2017 1623   PHURINE  7.0 06/26/2017 1623   GLUCOSEU NEGATIVE 06/26/2017 1623   HGBUR NEGATIVE 06/26/2017 1623   BILIRUBINUR NEGATIVE 06/26/2017 1623   KETONESUR NEGATIVE 06/26/2017 1623   PROTEINUR >=300 (A) 06/26/2017 1623   UROBILINOGEN 0.2 06/01/2013 1316  NITRITE NEGATIVE 06/26/2017 1623   LEUKOCYTESUR MODERATE (A) 06/26/2017 1623    Radiological Exams on Admission: Dg Chest 2 View  Result Date: 06/26/2017 CLINICAL DATA:  Generalized weakness EXAM: CHEST  2 VIEW COMPARISON:  03/06/2017 FINDINGS: Cardiomegaly. Bibasilar atelectasis or scarring with blunting of left costophrenic angle, likely scarring. No definite visible effusion on the lateral view. No change since prior study. IMPRESSION: Cardiomegaly.  Chronic bibasilar scarring or atelectasis. Electronically Signed   By: Rolm Baptise M.D.   On: 06/26/2017 11:23   Ct Head Wo Contrast  Result Date: 06/26/2017 CLINICAL DATA:  Generalize weakness for 1 month.  Status post fall. EXAM: CT HEAD WITHOUT CONTRAST TECHNIQUE: Contiguous axial images were obtained from the base of the skull through the vertex without intravenous contrast. COMPARISON:  03/31/2011 FINDINGS: Brain: No evidence of acute infarction, hemorrhage, extra-axial collection, ventriculomegaly, or mass effect. Old left basal ganglia lacunar infarct. Generalized cerebral atrophy. Periventricular white matter low attenuation likely secondary to microangiopathy. Vascular: Cerebrovascular atherosclerotic calcifications are noted. Skull: Negative for fracture or focal lesion. Sinuses/Orbits: Visualized portions of the orbits are unremarkable. Visualized portions of the paranasal sinuses and mastoid air cells are unremarkable. Other: None. IMPRESSION: 1. No acute intracranial pathology. 2. Chronic microvascular disease and cerebral atrophy. Electronically Signed   By: Kathreen Devoid   On: 06/26/2017 11:46    EKG: Independently reviewed.  She will fibrillation at 125 bpm with rapid ventricular  response  Assessment/Plan Principal Problem:   Atrial fibrillation with rapid ventricular response (HCC) Active Problems:   Hypertensive heart disease   End-stage renal disease on hemodialysis (HCC)   Generalized weakness   Dehydration   Obesity (BMI 30-39.9)   Hyperlipidemia 1.  Atrial fibrillation with rapid ventricular response: Clinically patient does appear to have some dehydration.  He has new weakness even though he seems to be extra vascularly edematous I believe that intravascularly he has dehydration.  I am going to give him 50 mL an hour over the next 20 hours for a total of 1 L of normal saline.  Hopefully this will help his heart rate.  Heart rate was not very responsive to IV diltiazem in the emergency department.  Will try IV fluids and other measures should fluids not be helpful.  He might require diltiazem drip.  2.  Hypertensive heart disease we will continue management as noted with home medications.  3.  End-stage renal disease on hemodialysis: Patient dialyzes on Monday Wednesdays and Fridays.  Will consult neurology for further evaluation and management.  4.  Generalized weakness: Patient will need a PT and OT evaluation will ask speech therapy to see him as well.  5.  Obesity: Weight loss is advised.  6.  Hyperlipidemia: Continue home medications.   DVT prophylaxis: On warfarin Code Status: Full code Family Communication: Spoke with Steven [name] his caretaker, she was present in the room.  He denies having any family who are involved. Disposition Plan: Will likely need skilled nursing facility Consults called: Dr. Jonnie Finner from nephrology Admission status: Observation   Lady Deutscher MD [facility] Pager [phone]  If 7PM-7AM, please contact night-coverage [email or web address] Password [email or web address]  06/26/2017, 6:15 PM

## 2017-06-26 NOTE — Progress Notes (Addendum)
Consult request has been received. CSW attempting to follow up at present time.  4:13 PM Per pt the pt's caretaker is Inez Catalina Lipford who is bedside with her husband Ed Lipford. Per caretaker Inez Catalina she is the caretaker and her and one other gentleman by the name of Delfino Lovett act as the pt's caretaker 12 hours a day 7 days a week, from approx 6am-6pm. Pt's caretaker stated pt needs SNF placement.    CSW counseled pt'scaretaker, is it not likely pt will qualify as rehab-able, nor is it likely insurance will authorize SNF stay or rehab since pt is experiencing generalized weakness and demonstrating an inability to ambulate, or stand and thus, is not likely to be able to participate in rehab.   Per Caretaker [facility] is pt's [facility] service and has been since pt's D/C from [facility] in May.  Per caretaker, [facility] comes out and changes the pt's catheter once a month and that [facility] used to provide PT, but that has since stopped.  Pt's caretaker states PT was stopped because the pt "is stubborn".   Caretaker states they are the caretaker because they are friends of the patient and do not accept payment.  Per caretaker, pt has only begun to decompensate this week, especially since yesterday when caretaker transported pt to the pt's PCP (Thursday 11/29) and when the pt returned home and today the pt slipped, per the caretaker.  Caretaker transported the pt to the pt's PCP on 11/29, but slipped/fell today.    Per caretaker pt is supposed to go to dialysis three times a week (M,W and Friday) at [facility] on [address] and has gone for three years.  Per caretaker pt needs to go to dialysis tomorrow (12/1 because the pt missed dialysis today.  Per the pt his Dialysis Center gave the pt meds (on Wednesday?) to make his BP "go up" and the pt didn't "do well".  Pt has a brother in [place] who is 24 but pt states he does not have the number.  CSW asked if he had pt's permission to find out information  about SNF's.  Pt asked the CSW to please place him into a SNF "rehab" and tell them I need help and rehab until I get better".    5:12 PM CSW asked if CSW DEPT can create an FL-2 ans send referral on his behalf and pt stated that's fine, I'll need one, I need a rehab".  CSW met with pt and confirmed pt's request to be discharged to SNF or rehab. CSW counseled pt is it not likely pt will qualify as rehab-able, nor is it likely insurance will authorize SNF stay or rehab since pt is experiencing generalized weakness and demonstrating an inability to ambulate, or stand and thus, is not likely to be able to participate in rehab.  CSW provided active listening and validated pt's concerns.   CSW will complete FL-2 and send referrals out to SNF facilities via the hub per pt's request.  Pt has been living independently prior to being admitted to [facility].  5:17 PM CSW called and spoke with Wells Guiles at [place] pt DOES NOT NEED PT CONSULT/RECOMMENDATION, PER [place].  CSW will continue to follow for D/C needs.  Alphonse Guild. Elysse Polidore, LCSW, LCAS, CSI Clinical Social Worker Ph: [phone]

## 2017-06-26 NOTE — Progress Notes (Signed)
Consult received for dialysis management  He dialyzes at [facility]. We saw him during his admission in May 2018.  We will contact his HD unit in the am for specific dialysis orders. Full consult in the am.  Currently in OBS status.  K 4.7 VSS stable. Safe to defer HD until Saturday.  Amalia Hailey, PA-C [facility] [phone]

## 2017-06-26 NOTE — ED Notes (Signed)
Pt CBG was 100, notified Wendy(RN)

## 2017-06-26 NOTE — ED Notes (Signed)
Patient transported to X-ray 

## 2017-06-26 NOTE — ED Notes (Signed)
Pt given water per Josh(PA)

## 2017-06-26 NOTE — ED Provider Notes (Signed)
[facility] EMERGENCY DEPARTMENT Provider Note   CSN: [id number] Arrival date & time: 06/26/17  1040     History   Chief Complaint Chief Complaint  Patient presents with  . Weakness    HPI Steven [name] is a 83 y.o. male.  Patient with history of atrial fibrillation on Coumadin, dialysis on M/W/F but continues to make some urine and has indwelling Foley, congestive heart failure on Lasix,  --presents with decreased energy and generalized weakness for the past 1 month.  Today the patient tried to get up from his couch to go to the bathroom and fell on the floor.  An ambulance was called.  Patient denies any focal weakness.  He has had some recent headaches which is unusual.  No fevers.  Baseline cough.  No vomiting or diarrhea.  No skin rashes.  Patient has chronic lower extremity edema.  States that previous dialysis session was normal. The onset of this condition was acute. The course is incidious. Aggravating factors: none. Alleviating factors: none.        Past Medical History:  Diagnosis Date  . Anemia   . Atrial fibrillation ([place])   . BPH (benign prostatic hyperplasia)   . CKD (chronic kidney disease), stage IV ([place])   . Gout   . HX: long term anticoagulant use   . Hypercalcemia   . Hypertension   . Kidney stone   . Mitral valve disorders(424.0)   . Osteoarthritis   . Prostate CA ([place])   . PVC (premature ventricular contraction)   . Vitamin D deficiency     Patient Active Problem List   Diagnosis Date Noted  . Atrial fibrillation with rapid ventricular response ([place])   . ESRD on dialysis ([place])   . Essential hypertension   . Acute respiratory failure with hypoxia ([place])   . Generalized weakness   . Shortness of breath 12/08/2016  . ESRD (end stage renal disease) ([place]) 12/08/2016  . Chronic kidney disease, stage IV (severe) ([place])   . Long term current use of anticoagulant therapy   . Hyperlipidemia   . Prostate cancer ([place])   . Acute  blood loss anemia 06/08/2013  . Obesity (BMI 30-39.9)   . Atrial fibrillation ([place])   . Hypertensive heart disease   . Mitral valve disease   . S/P right TKA 06/06/2013    Past Surgical History:  Procedure Laterality Date  . CARDIOVERSION N/A 12/11/2016   Procedure: CARDIOVERSION;  Surgeon: Josue Hector, MD;  Location: [facility] ENDOSCOPY;  Service: Cardiovascular;  Laterality: N/A;  . EYE SURGERY Bilateral    cataract extraction with IOL  . JOINT REPLACEMENT Left 2012   knee  . KIDNEY STONE SURGERY    . SP AV DIALYSIS SHUNT ACCESS EXISTING *L* Left    no dialysis  . TEE WITHOUT CARDIOVERSION N/A 12/11/2016   Procedure: TRANSESOPHAGEAL ECHOCARDIOGRAM (TEE);  Surgeon: Josue Hector, MD;  Location: [facility] ENDOSCOPY;  Service: Cardiovascular;  Laterality: N/A;  . TOTAL KNEE ARTHROPLASTY Right 06/06/2013   Procedure: RIGHT TOTAL KNEE ARTHROPLASTY;  Surgeon: Mauri Pole, MD;  Location: [facility] ORS;  Service: Orthopedics;  Laterality: Right;       Home Medications    Prior to Admission medications   Medication Sig Start Date End Date Taking? Authorizing Provider  acetaminophen (TYLENOL) 500 MG tablet Take 1,000 mg by mouth every 6 (six) hours as needed for mild pain, moderate pain, fever or headache.     [provider]  allopurinol (  ZYLOPRIM) 100 MG tablet Take 100 mg by mouth daily.     [provider]  amiodarone (PACERONE) 200 MG tablet Take 200 mg by mouth daily.    [provider]  atorvastatin (LIPITOR) 10 MG tablet Take 10 mg by mouth daily after supper.     [provider]  diltiazem (CARDIZEM CD) 240 MG 24 hr capsule Take 1 capsule (240 mg total) by mouth daily. 06/14/13   [name], PA-C  furosemide (LASIX) 40 MG tablet Take 40 mg by mouth See admin instructions. Take 1 tablet (40 mg) by mouth on Tuesday, Thursday, Saturday mornings    [provider]  levothyroxine (SYNTHROID, LEVOTHROID) 50 MCG tablet Take 50 mcg by mouth daily.      [provider]  medroxyPROGESTERone (PROVERA) 5 MG tablet Take 5 mg by mouth 2 (two) times daily.     [provider]  metoprolol succinate (TOPROL-XL) 25 MG 24 hr tablet Take 25 mg by mouth daily after supper.     [provider]  midodrine (PROAMATINE) 10 MG tablet Take 10 mg by mouth See admin instructions. Take 1 tablet (10 mg) by mouth on Monday, Wednesday, Friday mornings - before dialysis    [provider]  mirtazapine (REMERON) 30 MG tablet Take 30 mg by mouth daily after supper.     [provider]  multivitamin (RENA-VIT) TABS tablet Take 1 tablet by mouth daily.     [provider]  sevelamer carbonate (RENVELA) 800 MG tablet Take 800 mg by mouth daily after supper.     [provider]  tamsulosin (FLOMAX) 0.4 MG CAPS capsule Take 0.4 mg by mouth daily.    [provider]  vitamin B-12 (CYANOCOBALAMIN) 1000 MCG tablet Take 1,000 mcg by mouth daily after supper.     [provider]  warfarin (COUMADIN) 4 MG tablet Take 2 mg by mouth every evening.    [provider]    Family History No family history on file.  Social History Social History   Tobacco Use  . Smoking status: Passive Smoke Exposure - Never Smoker  . Smokeless tobacco: Former Network engineer Use Topics  . Alcohol use: Yes    Comment: attended [employer]  . Drug use: No     Allergies   Hydrocodone and Penicillins   Review of Systems Review of Systems  Constitutional: Negative for fever.  HENT: Negative for rhinorrhea and sore throat.   Eyes: Negative for redness.  Respiratory: Positive for cough. Negative for shortness of breath.   Cardiovascular: Negative for chest pain.  Gastrointestinal: Negative for abdominal pain, diarrhea, nausea and vomiting.  Genitourinary: Positive for hematuria. Negative for dysuria.  Musculoskeletal: Negative for myalgias.  Skin: Negative for rash.  Neurological: Positive for weakness  and headaches.     Physical Exam Updated Vital Signs BP (!) 111/56 (BP Location: Right Arm)   Pulse (!) 123   Temp 98.7 F (37.1 C) (Oral)   Resp (!) 28   SpO2 96%   Physical Exam  Constitutional: He is oriented to person, place, and time. He appears well-developed and well-nourished.  HENT:  Head: Normocephalic and atraumatic.  Mouth/Throat: Oropharynx is clear and moist.  Eyes: Conjunctivae are normal. Right eye exhibits no discharge. Left eye exhibits no discharge.  Neck: Normal range of motion. Neck supple.  Cardiovascular: Normal heart sounds. An irregular rhythm present. Tachycardia present.  No murmur heard. Pulmonary/Chest: Effort normal and breath sounds normal. No stridor. No  respiratory distress. He has no wheezes.  Abdominal: Soft. There is no tenderness.  Neurological: He is alert and oriented to person, place, and time. He has normal strength. No cranial nerve deficit or sensory deficit. GCS eye subscore is 4. GCS verbal subscore is 5. GCS motor subscore is 6.  Skin: Skin is warm and dry.  Psychiatric: He has a normal mood and affect.  Nursing note and vitals reviewed.    ED Treatments / Results  Labs (all labs ordered are listed, but only abnormal results are displayed) Labs Reviewed  CBC - Abnormal; Notable for the following components:      Result Value   RBC 4.17 (*)    Hemoglobin 12.7 (*)    RDW 17.1 (*)    All other components within normal limits  PROTIME-INR - Abnormal; Notable for the following components:   Prothrombin Time 24.4 (*)    All other components within normal limits  COMPREHENSIVE METABOLIC PANEL - Abnormal; Notable for the following components:   Sodium 133 (*)    Chloride 92 (*)    BUN 26 (*)    Creatinine, Ser 7.46 (*)    Calcium 8.5 (*)    Albumin 3.1 (*)    ALT 9 (*)    Alkaline Phosphatase 498 (*)    GFR calc non Af Amer 6 (*)    GFR calc Af Amer 7 (*)    Anion gap 17 (*)    All other components within normal limits    CBG MONITORING, ED - Abnormal; Notable for the following components:   Glucose-Capillary 100 (*)    All other components within normal limits  I-STAT TROPONIN, ED    EKG  EKG Interpretation  Date/Time:  Friday June 26 2017 10:51:59 EST Ventricular Rate:  124 PR Interval:    QRS Duration: 180 QT Interval:  413 QTC Calculation: 579 R Axis:   -154 Text Interpretation:  Atrial fibrillation Non-specific intra-ventricular conduction delay ST-t wave abnormality Abnormal ekg Confirmed by Carmin Muskrat [phone]) on 06/26/2017 11:18:54 AM       Radiology Dg Chest 2 View  Result Date: 06/26/2017 CLINICAL DATA:  Generalized weakness EXAM: CHEST  2 VIEW COMPARISON:  03/06/2017 FINDINGS: Cardiomegaly. Bibasilar atelectasis or scarring with blunting of left costophrenic angle, likely scarring. No definite visible effusion on the lateral view. No change since prior study. IMPRESSION: Cardiomegaly.  Chronic bibasilar scarring or atelectasis. Electronically Signed   By: Rolm Baptise M.D.   On: 06/26/2017 11:23   Ct Head Wo Contrast  Result Date: 06/26/2017 CLINICAL DATA:  Generalize weakness for 1 month.  Status post fall. EXAM: CT HEAD WITHOUT CONTRAST TECHNIQUE: Contiguous axial images were obtained from the base of the skull through the vertex without intravenous contrast. COMPARISON:  03/31/2011 FINDINGS: Brain: No evidence of acute infarction, hemorrhage, extra-axial collection, ventriculomegaly, or mass effect. Old left basal ganglia lacunar infarct. Generalized cerebral atrophy. Periventricular white matter low attenuation likely secondary to microangiopathy. Vascular: Cerebrovascular atherosclerotic calcifications are noted. Skull: Negative for fracture or focal lesion. Sinuses/Orbits: Visualized portions of the orbits are unremarkable. Visualized portions of the paranasal sinuses and mastoid air cells are unremarkable. Other: None. IMPRESSION: 1. No acute intracranial pathology. 2. Chronic  microvascular disease and cerebral atrophy. Electronically Signed   By: Kathreen Devoid   On: 06/26/2017 11:46    Procedures Procedures (including critical care time)  Medications Ordered in ED Medications  diltiazem (CARDIZEM) injection 5 mg (not administered)  diltiazem (CARDIZEM) injection 5 mg (5 mg  Intravenous Given 06/26/17 1533)     Initial Impression / Assessment and Plan / ED Course  I have reviewed the triage vital signs and the nursing notes.  Pertinent labs & imaging results that were available during my care of the patient were reviewed by me and considered in my medical decision making (see chart for details).     Patient seen and examined. Progressive generalized weakness. Tachycardic on arrival. Work-up initiated.   Vital signs reviewed and are as follows: BP (!) 111/56 (BP Location: Right Arm)   Pulse (!) 123   Temp 98.7 F (37.1 C) (Oral)   Resp (!) 28   SpO2 96%   Work-up reviewed with Dr. Vanita Panda. Will attempt to ambulate patient and obtain orthostatics.   Patient can sit on side of bed with assistance --he is unable to stand or ambulate.  Spoke further with patient regarding his home situation.  Patient has a nurse who comes out to his house once a month to change his catheter.  Other than that, he has 2 family friends nearby who are elderly with system with some tasks but are unable to care for him long-term.  IV cardizem ordered without much response. Additional dose ordered. Repeat EKG shows atrial fibrillation HR 125.   Will consult hospitalist for admission. Patient will likely require SW/CM consult with possible placement.   4:28 PM I paged hospitalist -- Dr. Vanita Panda has spoken with Dr. Evangeline Gula who will see patient to evaluate for admit.   Final Clinical Impressions(s) / ED Diagnoses   Final diagnoses:  Atrial fibrillation with RVR (HCC)  Generalized weakness  ESRD on dialysis [facility])   Generalized weakness -- pending completion of eval.    ED  Discharge Orders    None       Carlisle Cater, PA-C 06/26/17 1632    Carmin Muskrat, MD 06/27/17 [phone]

## 2017-06-26 NOTE — Progress Notes (Signed)
Notified [place], NP that pt is a dialysis pt and has chronic foley from home, pt unsure exactly why he has foley and progress note unclear. Pt had leg bag attached to foley and had 50cc of dark, brown cloudy urine that is strong smelling when arrived to unit at change of shift tonight. Pt states he hasn't emptied the urine in the bag in about 2-3 days. Pt states home health nurse changes foley every 2 months. Told NP that attached foley to standard drainage bag. Unable to get UA and urine culture due to pt producing no urine yet. Told NP pt had temp of 99.7 when arrived to unit tonight. NP stated to keep current foley in place and morning MD will address tomorrow morning. Will continue to monitor pt. Ranelle Oyster, RN

## 2017-06-26 NOTE — ED Triage Notes (Signed)
Pt in from home via [facility], per report pt c/o generalized weakness onset x 1 mth, pt fell today while ambulating with cane, pt takes Warfarin, pt has foley cath in place with hematuria, pt reports this is baseline, pt in A fib hx of the same, pt HD pt, pt reports full tx Weds, pt A&O x4, pt hypotensive in route, pt MAE

## 2017-06-26 NOTE — Progress Notes (Signed)
Pt arrived to 5w15 from ED at 1833 on dayshift. Pt is A&OX4. Pt lives at home alone. Pt's skin is dry. Pt has dark scabbed area to rt 5th toe, and abrasions to left arm. Pt has chronic foley that is attached to leg bed, that has around 50cc of dark brown, cloudy urine that is strong smelling. Pt states he hasn't emptied leg bag in about 2-3 days. Changed leg bag to standard foley drainage bag. Unable to get UA and urine culture due to no urine when pulling back with syringe. Pt is on low bed and telemetry. Told pt to call for assistance before getting out of bed, pt stated understanding. Will continue to monitor pt. Ranelle Oyster, RN

## 2017-06-26 NOTE — Progress Notes (Addendum)
Per [place] [facility] pt's PASSR is [id number]  Per Shaquina [phone] at [facility], pt has a bed offer at [facility] and [facility] CAN transport the pt to dialysis when needed as long as pt is agreeable to receiving dialysis at a [facility] instead of the House at:  Address: [address] [place], [place] [address] Phone: [phone]  [facility] states they may even be able to facilitate a permanent transfer from the [facility] facility to the [facility] facility if pt is agreeable.  Per pt's caretaker pt lives in [place].  CSW updated pt's caretaker Inez Catalina at ph: [phone] and Inez Catalina asks that the ED CSW call on 12/1 or when pt D/C's to please call her and update her.  Per pt's caretaker pt prefers the Dialysis Center on [facility] is their preference for [facility] but will go where necessary during their SNF stay but otherwise would prefer to stay in [facility], per caretaker.  CSW provided caretaker with Shaquina's number at [facility] [phone].  CSW did not create FL-2 due to pt's medications and/or condition change while pt is inpatient.    Pt accepted bed offer to [facility] on 11/30 by phone with the CSW.  [facility] HAS CONTRACT WITH [employer] MEDICARE pt DOES NOT NEED PT CONSULT/RECOMMENDATION, PER [facility].  CSW will continue to follow for D/C needs.  Alphonse Guild. Finlee Concepcion, LCSW, LCAS, CSI Clinical Social Worker Ph: [phone]

## 2017-06-26 NOTE — Care Management Obs Status (Signed)
[facility] NOTIFICATION   Patient Details  Name: Steven [name] MRN: [id number] Date of Birth: 08/28/1926   Medicare Observation Status Notification Given:  Ernesta Amble, RN 06/26/2017, 6:30 PM

## 2017-06-26 NOTE — Discharge Planning (Signed)
Pt from home alone as elderly neighbors who "look out" for him.  In ER due to progressing weakness and fall.  Requesting SNF placement.  EDCM consulted SW to assist with this matter.

## 2017-06-27 DIAGNOSIS — I4891 Unspecified atrial fibrillation: Secondary | ICD-10-CM | POA: Diagnosis not present

## 2017-06-27 LAB — CBC
HCT: 34.7 % — ABNORMAL LOW (ref 39.0–52.0)
Hemoglobin: 11.1 g/dL — ABNORMAL LOW (ref 13.0–17.0)
MCH: 30.4 pg (ref 26.0–34.0)
MCHC: 32 g/dL (ref 30.0–36.0)
MCV: 95.1 fL (ref 78.0–100.0)
PLATELETS: 138 10*3/uL — AB (ref 150–400)
RBC: 3.65 MIL/uL — AB (ref 4.22–5.81)
RDW: 17 % — AB (ref 11.5–15.5)
WBC: 8.4 10*3/uL (ref 4.0–10.5)

## 2017-06-27 LAB — PROTIME-INR
INR: 2.24
PROTHROMBIN TIME: 24.6 s — AB (ref 11.4–15.2)

## 2017-06-27 LAB — BASIC METABOLIC PANEL
Anion gap: 13 (ref 5–15)
BUN: 32 mg/dL — ABNORMAL HIGH (ref 6–20)
CALCIUM: 8.1 mg/dL — AB (ref 8.9–10.3)
CO2: 24 mmol/L (ref 22–32)
CREATININE: 8.25 mg/dL — AB (ref 0.61–1.24)
Chloride: 96 mmol/L — ABNORMAL LOW (ref 101–111)
GFR, EST AFRICAN AMERICAN: 6 mL/min — AB (ref >60)
GFR, EST NON AFRICAN AMERICAN: 5 mL/min — AB (ref >60)
Glucose, Bld: 97 mg/dL (ref 65–99)
Potassium: 4.8 mmol/L (ref 3.5–5.1)
SODIUM: 133 mmol/L — AB (ref 135–145)

## 2017-06-27 LAB — APTT: APTT: 46 s — AB (ref 24–36)

## 2017-06-27 LAB — MRSA PCR SCREENING: MRSA BY PCR: NEGATIVE

## 2017-06-27 MED ORDER — LEVALBUTEROL HCL 0.63 MG/3ML IN NEBU
0.6300 mg | INHALATION_SOLUTION | Freq: Four times a day (QID) | RESPIRATORY_TRACT | Status: DC
  Administered 2017-06-27 (×2): 0.63 mg via RESPIRATORY_TRACT
  Filled 2017-06-27 (×3): qty 3

## 2017-06-27 MED ORDER — MIDODRINE HCL 5 MG PO TABS
10.0000 mg | ORAL_TABLET | Freq: Three times a day (TID) | ORAL | Status: DC
  Administered 2017-06-27 – 2017-07-04 (×22): 10 mg via ORAL
  Filled 2017-06-27 (×20): qty 2

## 2017-06-27 MED ORDER — LEVALBUTEROL HCL 0.63 MG/3ML IN NEBU: 0.6300 mg | INHALATION_SOLUTION | Freq: Four times a day (QID) | RESPIRATORY_TRACT | Status: DC | PRN

## 2017-06-27 MED ORDER — DOXERCALCIFEROL 4 MCG/2ML IV SOLN
INTRAVENOUS | Status: AC
Start: 2017-06-27 — End: 2017-06-27
  Administered 2017-06-27: 3 ug via INTRAVENOUS
  Filled 2017-06-27: qty 2

## 2017-06-27 MED ORDER — DOXERCALCIFEROL 4 MCG/2ML IV SOLN
3.0000 ug | Freq: Once | INTRAVENOUS | Status: AC
  Administered 2017-06-27: 3 ug via INTRAVENOUS
  Filled 2017-06-27: qty 2

## 2017-06-27 MED ORDER — DOXERCALCIFEROL 4 MCG/2ML IV SOLN
3.0000 ug | INTRAVENOUS | Status: DC
  Administered 2017-06-29 – 2017-07-03 (×3): 3 ug via INTRAVENOUS
  Filled 2017-06-27 (×3): qty 2

## 2017-06-27 MED ORDER — NEPRO/CARBSTEADY PO LIQD
237.0000 mL | Freq: Two times a day (BID) | ORAL | Status: DC
  Administered 2017-06-27 – 2017-07-04 (×11): 237 mL via ORAL
  Filled 2017-06-27: qty 237

## 2017-06-27 NOTE — Consult Note (Addendum)
[facility] Renal Consultation Note    Indication for Consultation:  Management of ESRD/hemodialysis; anemia, hypertension/volume and secondary hyperparathyroidism Referring physician: [facility]  HPI: Steven [name] is a 83 y.o. male with ESRD on MWF [facility] with hx of afib on chronic coumadin, midodrine for BP support, prostate cancer who was brought to the ED by a friend due to progressive weakness.  His friend who has been helping him is also debilitated and unable to manage him. He has no immediate family per his outpatient HD RN.  Upon presentation he was found to be in rapid afib.  He was given IV cardizem but rate remained at 120s.  His last dialysis treatment was 11/28 and treatment sheets indicated HR was stable in the 70 - 80s with BP stable in the 110 - 130s. He was noted to have 2+ LE edema - his goal was challenged and his EDW was lowered 3# to 197 (post wt 196.4#).  Patient is agreeable to placement for rehab.  His wife died in 12-Nov-2011 and one of his caregivers is "BJ" who was a friend of his wife.  She is with him today. She keeps his pill box.  He is continent and makes little urine. He left his dentures at home.  Previously he was able to walk with walker or cane. He doesn't think his appetite is reduced. He denies SOB, cough, N, V, D. He is not SOB. He is not on home O2.  Past Medical History:  Diagnosis Date  . Anemia   . Atrial fibrillation ([place])   . BPH (benign prostatic hyperplasia)   . CKD (chronic kidney disease), stage IV ([place])   . Gout   . HX: long term anticoagulant use   . Hypercalcemia   . Hypertension   . Kidney stone   . Mitral valve disorders(424.0)   . Osteoarthritis   . Prostate CA ([place])   . PVC (premature ventricular contraction)   . Vitamin D deficiency    Past Surgical History:  Procedure Laterality Date  . CARDIOVERSION N/A 12/11/2016   Procedure: CARDIOVERSION;  Surgeon: Josue Hector, MD;  Location: [facility]  ENDOSCOPY;  Service: Cardiovascular;  Laterality: N/A;  . EYE SURGERY Bilateral    cataract extraction with IOL  . JOINT REPLACEMENT Left 2010-11-12   knee  . KIDNEY STONE SURGERY    . SP AV DIALYSIS SHUNT ACCESS EXISTING *L* Left    no dialysis  . TEE WITHOUT CARDIOVERSION N/A 12/11/2016   Procedure: TRANSESOPHAGEAL ECHOCARDIOGRAM (TEE);  Surgeon: Josue Hector, MD;  Location: [facility] ENDOSCOPY;  Service: Cardiovascular;  Laterality: N/A;  . TOTAL KNEE ARTHROPLASTY Right 06/06/2013   Procedure: RIGHT TOTAL KNEE ARTHROPLASTY;  Surgeon: Mauri Pole, MD;  Location: [facility] ORS;  Service: Orthopedics;  Laterality: Right;   History reviewed. No pertinent family history. Social History:  reports that he is a non-smoker but has been exposed to tobacco smoke. He has quit using smokeless tobacco. He reports that he drinks alcohol. He reports that he does not use drugs. Allergies  Allergen Reactions  . Hydrocodone Other (See Comments)    Reaction:  Confusion   . Methocarbamol Other (See Comments)    Reported by [facility] 03/06/17 - unknown reaction  . Penicillins Hives and Other (See Comments)    Has patient had a PCN reaction causing immediate rash, facial/tongue/throat swelling, SOB or lightheadedness with hypotension: No Has patient had a PCN reaction causing severe rash involving  mucus membranes or skin necrosis: No Has patient had a PCN reaction that required hospitalization No Has patient had a PCN reaction occurring within the last 10 years: No If all of the above answers are "NO", then may proceed with Cephalosporin use.   Prior to Admission medications   Medication Sig Start Date End Date Taking? Authorizing Provider  acetaminophen (TYLENOL) 500 MG tablet Take 1,000 mg by mouth every 6 (six) hours as needed for mild pain, moderate pain, fever or headache.    Yes [provider]  allopurinol (ZYLOPRIM) 100 MG tablet Take 100 mg by mouth daily.    Yes [provider]   amiodarone (PACERONE) 200 MG tablet Take 200 mg by mouth daily.   Yes [provider]  atorvastatin (LIPITOR) 10 MG tablet Take 10 mg by mouth daily after supper.    Yes [provider]  diltiazem (CARDIZEM CD) 240 MG 24 hr capsule Take 1 capsule (240 mg total) by mouth daily. Patient taking differently: Take 240 mg by mouth See admin instructions. Take 1 capsule (240 mg) by mouth on Sunday, Tuesday, Thursday, Saturday morning (non-dialysis days) 06/14/13  Yes Babish, Rodman Key, PA-C  furosemide (LASIX) 40 MG tablet Take 40 mg by mouth See admin instructions. Take 1 tablet (40 mg) by mouth on Tuesday, Thursday, Saturday mornings   Yes [provider]  levothyroxine (SYNTHROID, LEVOTHROID) 50 MCG tablet Take 50 mcg by mouth daily.    Yes [provider]  medroxyPROGESTERone (PROVERA) 5 MG tablet Take 5 mg by mouth 2 (two) times daily.    Yes [provider]  Melatonin 3 MG TABS Take 3 mg by mouth at bedtime.   Yes [provider]  metoprolol succinate (TOPROL-XL) 25 MG 24 hr tablet Take 25 mg by mouth See admin instructions. Take 1 tablet (25 mg) by mouth on Sunday, Tuesday, Thursday, Saturday mornings (non-dialysis days)   Yes [provider]  midodrine (PROAMATINE) 10 MG tablet Take 10 mg by mouth See admin instructions. Take 1 tablet (10 mg) by mouth twice daily - morning and lunch   Yes [provider]  mirtazapine (REMERON) 30 MG tablet Take 30 mg by mouth daily after supper.    Yes [provider]  multivitamin (RENA-VIT) TABS tablet Take 1 tablet by mouth daily.    Yes [provider]  sevelamer carbonate (RENVELA) 800 MG tablet Take 800 mg by mouth daily with breakfast.    Yes [provider]  tamsulosin (FLOMAX) 0.4 MG CAPS capsule Take 0.4 mg by mouth daily.   Yes [provider]  vitamin B-12 (CYANOCOBALAMIN) 500 MCG tablet Take 500 mcg by mouth daily after supper.   Yes [provider]  warfarin (COUMADIN) 4 MG tablet Take 2 mg by mouth daily after supper.    Yes [provider]   Current Facility-Administered Medications  Medication Dose Route Frequency Provider Last Rate Last Dose  . acetaminophen (TYLENOL) tablet 1,000 mg  1,000 mg Oral Q6H PRN Lady Deutscher, MD      . allopurinol (ZYLOPRIM) tablet 100 mg  100 mg Oral Daily Lady Deutscher, MD   100 mg at 06/27/17 0847  . amiodarone (PACERONE) tablet 200 mg  200 mg Oral Daily Lady Deutscher, MD   200 mg at 06/27/17 0846  . atorvastatin (LIPITOR) tablet 10 mg  10 mg Oral QPC supper Lady Deutscher, MD   10 mg at 06/26/17 2028  . diltiazem (CARDIZEM CD) 24 hr  capsule 240 mg  240 mg Oral Daily Lady Deutscher, MD   240 mg at 06/27/17 0846  . doxercalciferol (HECTOROL) injection 3 mcg  3 mcg Intravenous Once Alric Seton, PA-C      . [START ON 06/29/2017] doxercalciferol (HECTOROL) injection 3 mcg  3 mcg Intravenous Q M,W,F-HD Alric Seton, PA-C      . levalbuterol Penne Lash) nebulizer solution 0.63 mg  0.63 mg Nebulization Q6H Georgette Shell, MD   0.63 mg at 06/27/17 1044  . levothyroxine (SYNTHROID, LEVOTHROID) tablet 50 mcg  50 mcg Oral QAC breakfast Lady Deutscher, MD   50 mcg at 06/27/17 0849  . MEDLINE mouth rinse  15 mL Mouth Rinse BID Lady Deutscher, MD   15 mL at 06/27/17 0900  . medroxyPROGESTERone (PROVERA) tablet 5 mg  5 mg Oral BID Lady Deutscher, MD   5 mg at 06/27/17 0849  . midodrine (PROAMATINE) tablet 10 mg  10 mg Oral TID Georgette Shell, MD      . mirtazapine (REMERON) tablet 30 mg  30 mg Oral QPC supper Lady Deutscher, MD   30 mg at 06/26/17 2028  . multivitamin (RENA-VIT) tablet 1 tablet  1 tablet Oral Daily Lady Deutscher, MD   1 tablet at 06/27/17 0857  . ondansetron (ZOFRAN) tablet 4 mg  4 mg Oral Q6H PRN Lady Deutscher, MD       Or  . ondansetron [facility]) injection 4 mg  4 mg Intravenous Q6H PRN Lady Deutscher, MD       . polyethylene glycol (MIRALAX / GLYCOLAX) packet 17 g  17 g Oral Daily PRN Lady Deutscher, MD      . sevelamer carbonate (RENVELA) tablet 800 mg  800 mg Oral QPC supper Lady Deutscher, MD   800 mg at 06/26/17 2028  . sodium chloride flush (NS) 0.9 % injection 3 mL  3 mL Intravenous Q12H Lady Deutscher, MD      . vitamin B-12 (CYANOCOBALAMIN) tablet 1,000 mcg  1,000 mcg Oral QPC supper Lady Deutscher, MD   1,000 mcg at 06/26/17 2032  . warfarin (COUMADIN) tablet 2 mg  2 mg Oral QPM Lady Deutscher, MD   2 mg at 06/26/17 2028  . Warfarin - Physician Dosing Inpatient   Does not apply q1800 Lady Deutscher, MD       Labs: Basic Metabolic Panel: Recent Labs  Lab 06/26/17 1057 06/27/17 0300  NA 133* 133*  K 4.7 4.8  CL 92* 96*  CO2 24 24  GLUCOSE 99 97  BUN 26* 32*  CREATININE 7.46* 8.25*  CALCIUM 8.5* 8.1*   Liver Function Tests: Recent Labs  Lab 06/26/17 1057  AST 25  ALT 9*  ALKPHOS 498*  BILITOT 0.7  PROT 6.9  ALBUMIN 3.1*   No results for input(s): LIPASE, AMYLASE in the last 168 hours. No results for input(s): AMMONIA in the last 168 hours. CBC: Recent Labs  Lab 06/26/17 1057 06/27/17 0300  WBC 8.3 8.4  HGB 12.7* 11.1*  HCT 40.2 34.7*  MCV 96.4 95.1  PLT 161 138*   Cardiac Enzymes: No results for input(s): CKTOTAL, CKMB, CKMBINDEX, TROPONINI in the last 168 hours. CBG: Recent Labs  Lab 06/26/17 1102  GLUCAP 100*   Iron Studies: No results for input(s): IRON, TIBC, TRANSFERRIN, FERRITIN in the last 72 hours. Studies/Results: Dg Chest 2 View  Result Date: 06/26/2017 CLINICAL DATA:  Generalized weakness EXAM: CHEST  2 VIEW  COMPARISON:  03/06/2017 FINDINGS: Cardiomegaly. Bibasilar atelectasis or scarring with blunting of left costophrenic angle, likely scarring. No definite visible effusion on the lateral view. No change since prior study. IMPRESSION: Cardiomegaly.  Chronic bibasilar scarring or atelectasis. Electronically Signed   By:  Rolm Baptise M.D.   On: 06/26/2017 11:23   Ct Head Wo Contrast  Result Date: 06/26/2017 CLINICAL DATA:  Generalize weakness for 1 month.  Status post fall. EXAM: CT HEAD WITHOUT CONTRAST TECHNIQUE: Contiguous axial images were obtained from the base of the skull through the vertex without intravenous contrast. COMPARISON:  03/31/2011 FINDINGS: Brain: No evidence of acute infarction, hemorrhage, extra-axial collection, ventriculomegaly, or mass effect. Old left basal ganglia lacunar infarct. Generalized cerebral atrophy. Periventricular white matter low attenuation likely secondary to microangiopathy. Vascular: Cerebrovascular atherosclerotic calcifications are noted. Skull: Negative for fracture or focal lesion. Sinuses/Orbits: Visualized portions of the orbits are unremarkable. Visualized portions of the paranasal sinuses and mastoid air cells are unremarkable. Other: None. IMPRESSION: 1. No acute intracranial pathology. 2. Chronic microvascular disease and cerebral atrophy. Electronically Signed   By: Kathreen Devoid   On: 06/26/2017 11:46   ROS: As per HPI otherwise negative.   Physical Exam: Vitals:   06/27/17 0012 06/27/17 0340 06/27/17 0507 06/27/17 1038  BP: (!) 103/51  103/63   Pulse: (!) 115  (!) 105   Resp:   18   Temp:   98 F (36.7 C)   TempSrc:   Oral   SpO2:   100% 97%  Weight:  94.8 kg (209 lb)    Height:         General: elderly WM eating lunch sitting in reclier - NAD - on O2 Head: NCAT sclera not icteric MMM, edentulous Neck: Supple.  Lungs: CTA bilaterally without wheezes, rales, or rhonchi. Breathing is unlabored. Heart: irreg irreg Abdomen: soft NT + BS Lower extremities: 1-2+ edema - no wounds on LE prominent pretib and pedal edema bilat  Neuro: A & O  X 3. Moves all extremities spontaneously. Psych:  Responds to questions appropriately with a normal affect. Dialysis Access: left upper AVF + bruit  Dialysis Orders: [facility] MWF EDW 197 lb 2 K 2.5 Ca Qb 350 DFR 500  3.5 hours hectorol 3 Mircera 50 given 11/28 parsabiv 2.5 no heparin left upper AVF  Assessment/Plan: 1. Afib with RVR- rate down now 2. ESRD -  MWF - missed Friday - HD for HD today and back on schedule Monday. 3. BP/volume  - on midodrine for BP support- UF as BP allows 4. Anemia  - no recent labs avail prior to admission; hgb 11.1  Received Mircera 50 11/28 5. Metabolic bone disease -  Continue Hectorol/binders - parsabiv not avail at [facility]  6. Nutrition - alb 3.1 - added nepro - passed swallowing eval this am 7. Disp - per primary- needs placement  [name], PA-C [place] [phone] 06/27/2017, 12:56 PM   Pt seen, examined and agree w A/P as above.  [name] MD [employer] pager [phone]   06/27/2017, 3:50 PM

## 2017-06-27 NOTE — Evaluation (Addendum)
Physical Therapy Evaluation Patient Details Name: Steven [name] MRN: [id number] DOB: [birth year] Today's Date: 06/27/2017   History of Present Illness  Pt is an 83 y/o male admitted secondary to weakness and falls at home. Upon arrival, pt found to be in a-fib with RVR. PMH including but not limited to ESRD, HTN and HLD.  Clinical Impression  Pt presented supine in bed with HOB elevated, awake and willing to participate in therapy session. Prior to admission, pt reported that he ambulated short distances with RW and required assistance for ADLs. Pt lives alone but has a caregiver that comes to assist (unsure of when or how often). No caregivers or family members present to confirm history information. Pt currently requires mod A for bed mobility and mod-max A x2 for transfers. Pt very limited secondary to fatigue, weakness and pain. Pt would continue to benefit from skilled physical therapy services at this time while admitted and after d/c to address the below listed limitations in order to improve overall safety and independence with functional mobility.     Follow Up Recommendations SNF    Equipment Recommendations  None recommended by PT    Recommendations for Other Services       Precautions / Restrictions Precautions Precautions: Fall Restrictions Weight Bearing Restrictions: No      Mobility  Bed Mobility Overal bed mobility: Needs Assistance Bed Mobility: Supine to Sit     Supine to sit: Mod assist     General bed mobility comments: increased time and effort, cueing for sequencing, assist with bilateral LEs and to elevate trunk  Transfers Overall transfer level: Needs assistance Equipment used: 2 person hand held assist Transfers: Sit to/from Stand;Stand Pivot Transfers Sit to Stand: Mod assist;+2 physical assistance Stand pivot transfers: Max assist;+2 physical assistance       General transfer comment: increased time and effort, bed in elevated position,  cueing for technique, assist of two to rise from EOB and with pivotal movements to chair. initially, pt's R knee buckling but able to recover  Ambulation/Gait                Stairs            Wheelchair Mobility    Modified Rankin (Stroke Patients Only)       Balance Overall balance assessment: Needs assistance;History of Falls Sitting-balance support: Feet supported Sitting balance-Leahy Scale: Fair     Standing balance support: During functional activity;Bilateral upper extremity supported Standing balance-Leahy Scale: Poor Standing balance comment: reliant on bilateral UE supports and mod-max A x2                             Pertinent Vitals/Pain Pain Assessment: Faces Faces Pain Scale: Hurts even more Pain Location: "all over" Pain Descriptors / Indicators: Sore Pain Intervention(s): Monitored during session;Repositioned    Home Living Family/patient expects to be discharged to:: Skilled nursing facility Living Arrangements: Alone                    Prior Function Level of Independence: Needs assistance   Gait / Transfers Assistance Needed: ambulates short distances with RW, uses w/c on dialysis days  ADL's / Homemaking Assistance Needed: requires assistance from caregiver (unsure of how much, pt very vague and very HOH)        Hand Dominance        Extremity/Trunk Assessment   Upper Extremity Assessment Upper Extremity Assessment: Generalized weakness  Lower Extremity Assessment Lower Extremity Assessment: Generalized weakness(pitting edema noted bilaterally)       Communication   Communication: HOH  Cognition Arousal/Alertness: Awake/alert Behavior During Therapy: WFL for tasks assessed/performed Overall Cognitive Status: Impaired/Different from baseline Area of Impairment: Memory;Following commands;Safety/judgement;Problem solving                     Memory: Decreased short-term memory Following  Commands: Follows one step commands with increased time Safety/Judgement: Decreased awareness of safety;Decreased awareness of deficits   Problem Solving: Slow processing;Decreased initiation;Difficulty sequencing;Requires verbal cues;Requires tactile cues        General Comments      Exercises     Assessment/Plan    PT Assessment Patient needs continued PT services  PT Problem List Decreased strength;Decreased activity tolerance;Decreased balance;Decreased mobility;Decreased coordination;Decreased cognition;Decreased knowledge of use of DME;Decreased safety awareness;Pain       PT Treatment Interventions DME instruction;Gait training;Stair training;Functional mobility training;Therapeutic activities;Therapeutic exercise;Balance training;Neuromuscular re-education;Cognitive remediation;Patient/family education    PT Goals (Current goals can be found in the Care Plan section)  Acute Rehab PT Goals Patient Stated Goal: to get stronger PT Goal Formulation: With patient Time For Goal Achievement: 07/11/17 Potential to Achieve Goals: Fair    Frequency Min 2X/week   Barriers to discharge        Co-evaluation               AM-PAC PT "6 Clicks" Daily Activity  Outcome Measure Difficulty turning over in bed (including adjusting bedclothes, sheets and blankets)?: Unable Difficulty moving from lying on back to sitting on the side of the bed? : Unable Difficulty sitting down on and standing up from a chair with arms (e.g., wheelchair, bedside commode, etc,.)?: Unable Help needed moving to and from a bed to chair (including a wheelchair)?: A Lot Help needed walking in hospital room?: A Lot Help needed climbing 3-5 steps with a railing? : Total 6 Click Score: 8    End of Session Equipment Utilized During Treatment: Gait belt;Oxygen Activity Tolerance: Patient limited by fatigue Patient left: in chair;with call bell/phone within reach;with chair alarm set Nurse  Communication: Mobility status;Need for lift equipment PT Visit Diagnosis: Other abnormalities of gait and mobility (R26.89)    Time: 1443-1540 PT Time Calculation (min) (ACUTE ONLY): 16 min   Charges:   PT Evaluation $PT Eval Moderate Complexity: 1 Mod     PT G Codes:   PT G-Codes **NOT FOR INPATIENT CLASS** Functional Assessment Tool Used: AM-PAC 6 Clicks Basic Mobility;Clinical judgement Functional Limitation: Mobility: Walking and moving around Mobility: Walking and Moving Around Current Status (G8676): At least 80 percent but less than 100 percent impaired, limited or restricted Mobility: Walking and Moving Around Goal Status [phone]): At least 40 percent but less than 60 percent impaired, limited or restricted    [facility], [place], DPT [place] 06/27/2017, 9:29 AM

## 2017-06-27 NOTE — Evaluation (Signed)
Clinical/Bedside Swallow Evaluation Patient Details  Name: Steven [name] MRN: [id number] Date of Birth: 15-Mar-1927  Today's Date: 06/27/2017 Time: SLP Start Time (ACUTE ONLY): 1010 SLP Stop Time (ACUTE ONLY): 1020 SLP Time Calculation (min) (ACUTE ONLY): 10 min  Past Medical History:  Past Medical History:  Diagnosis Date  . Anemia   . Atrial fibrillation ([place])   . BPH (benign prostatic hyperplasia)   . CKD (chronic kidney disease), stage IV ([place])   . Gout   . HX: long term anticoagulant use   . Hypercalcemia   . Hypertension   . Kidney stone   . Mitral valve disorders(424.0)   . Osteoarthritis   . Prostate CA ([place])   . PVC (premature ventricular contraction)   . Vitamin D deficiency    Past Surgical History:  Past Surgical History:  Procedure Laterality Date  . CARDIOVERSION N/A 12/11/2016   Procedure: CARDIOVERSION;  Surgeon: Josue Hector, MD;  Location: [facility] ENDOSCOPY;  Service: Cardiovascular;  Laterality: N/A;  . EYE SURGERY Bilateral    cataract extraction with IOL  . JOINT REPLACEMENT Left 2012   knee  . KIDNEY STONE SURGERY    . SP AV DIALYSIS SHUNT ACCESS EXISTING *L* Left    no dialysis  . TEE WITHOUT CARDIOVERSION N/A 12/11/2016   Procedure: TRANSESOPHAGEAL ECHOCARDIOGRAM (TEE);  Surgeon: Josue Hector, MD;  Location: [facility] ENDOSCOPY;  Service: Cardiovascular;  Laterality: N/A;  . TOTAL KNEE ARTHROPLASTY Right 06/06/2013   Procedure: RIGHT TOTAL KNEE ARTHROPLASTY;  Surgeon: Mauri Pole, MD;  Location: [facility] ORS;  Service: Orthopedics;  Laterality: Right;   HPI:  Pt is an 83 y/o male admitted secondary to weakness and falls at home. Upon arrival, pt found to be in a-fib with RVR. PMH including but not limited to ESRD, HTN and HLD. CXR showing chronic bibasilar scarring or atelectasis. CT head negative for acute findings. Referred for swallow evaluation.    Assessment / Plan / Recommendation Clinical Impression  Patient presents with oropharyngeal swallow which  appears at bedside to be within functional limits with adequate airway protection. No overt signs of aspiration observed despite challenging with consecutive straw sips of thin liquids in excess of 3oz. Pt is edentulous and does require extended time for mastication of regular solid. He and caregiver report that he wears dentures but usually doesn't wear them for eating. States he did have a coughing episode with grits but denies this is a regular occurrence. Recommend regular diet with thin liquids, no further skilled ST needs identified. Can downgrade to soft diet if pt reports difficulty with solids without consulting SLP. Will s/o.   SLP Visit Diagnosis: Dysphagia, unspecified (R13.10)    Aspiration Risk  Mild aspiration risk    Diet Recommendation Regular;Thin liquid(can downgrade to soft if pt prefers as dentures not availabl)   Liquid Administration via: Cup;Straw Medication Administration: Whole meds with liquid Supervision: Patient able to self feed Compensations: Slow rate;Small sips/bites    Other  Recommendations Oral Care Recommendations: Oral care BID   Follow up Recommendations Skilled Nursing facility      Frequency and Duration            Prognosis Prognosis for Safe Diet Advancement: Good      Swallow Study   General Date of Onset: 06/26/17 HPI: Pt is an 83 y/o male admitted secondary to weakness and falls at home. Upon arrival, pt found to be in a-fib with RVR. PMH including but not limited to ESRD, HTN and HLD.  CXR showing chronic bibasilar scarring or atelectasis. CT head negative for acute findings. Referred for swallow evaluation.  Type of Study: Bedside Swallow Evaluation Previous Swallow Assessment: none present Diet Prior to this Study: Regular;Thin liquids Temperature Spikes Noted: Yes(99.7) Respiratory Status: Nasal cannula History of Recent Intubation: No Behavior/Cognition: Alert;Cooperative Oral Cavity Assessment: Within Functional Limits Oral  Care Completed by SLP: No Oral Cavity - Dentition: Edentulous;Dentures, not available Vision: Functional for self-feeding Self-Feeding Abilities: Able to feed self Patient Positioning: Upright in chair Baseline Vocal Quality: Normal Volitional Cough: Strong Volitional Swallow: Able to elicit    Oral/Motor/Sensory Function Overall Oral Motor/Sensory Function: Within functional limits   Ice Chips Ice chips: Not tested   Thin Liquid Thin Liquid: Within functional limits Presentation: Straw;Cup;Self Fed    Nectar Thick Nectar Thick Liquid: Not tested   Honey Thick Honey Thick Liquid: Not tested   Puree Puree: Within functional limits Presentation: Self Fed;Spoon   Solid   GO   Solid: Within functional limits Presentation: Self Fed Other Comments: prolonged mastication; pt clears all residue    Functional Assessment Tool Used: skilled clinical judgment Functional Limitations: Swallowing Swallow Current Status (G6269): At least 1 percent but less than 20 percent impaired, limited or restricted Swallow Goal Status [phone]): At least 1 percent but less than 20 percent impaired, limited or restricted Swallow Discharge Status [phone]): At least 1 percent but less than 20 percent impaired, limited or restricted   Aliene Altes 06/27/2017,10:27 AM  Deneise Lever, [place], [place] Speech-Language Pathologist [phone]

## 2017-06-27 NOTE — Progress Notes (Signed)
Patient evening medication is not given.Patient in the dialysis unit.

## 2017-06-27 NOTE — Progress Notes (Signed)
PROGRESS NOTE    Steven [name]  ZOX:[id number] DOB: 1926-12-03 DOA: 06/26/2017 PCP: Orpah Melter, MD Brief Narrative: 83 y.o. male with medical history significant hypertension, hyperlipidemia, end-stage renal disease on hemodialysis Monday Wednesdays and Fridays, atrial fibrillation on Coumadin, who presents to the ED with a complaint of weakness for about a month and falling this morning.  Patient has a Foley in place because he continues to make some urine.  He also has a history of hypertensive heart disease and takes Lasix.  Today the patient got up from his couch to go to the bathroom and he fell on the floor.  An ambulance was called.  He denies any focal weakness but does complain of generalized weakness.  He has had some headaches which have been unusual for him he usually does not have these.  He complains of a baseline cough but denies any fevers vomiting or diarrhea.  He has no skin rashes.  He has chronic lower extremity edema and states that his previous dialysis session was normal.  Symptoms came on acutely this.  But the course has been insidious with the weakness. ED Course: Patient was found to be unable to stand or ambulate.  It was noted that he has a nurse who comes to his house once a month change of catheter and he has 2 family friends who live nearby and are elderly who assist him with some tasks but are unable to care for him long-term.  He was found to be in atrial fibrillation with rapid response and IV Cardizem was given without much response.  His EKG remained with an atrial fibrillation at a heart rate of 125.  Given his recent profound weakness inability to care for himself and uncontrolled atrial fibrillation the patient will be admitted into the hospital for further evaluation and management.      Assessment & Plan:   Principal Problem:   Atrial fibrillation with rapid ventricular response (HCC) Active Problems:   Obesity (BMI 30-39.9)   Hypertensive heart  disease   Hyperlipidemia   End-stage renal disease on hemodialysis (HCC)   Generalized weakness   Dehydration  1]AFIB WITH RVR-heart rate 110.on amio and cardizem.on coumadin 2mg .pharmacy foll.would consider dc with falls?? 2]ESRD MISSED HD FRI-ON M/W/F.neprologist aware of admit. 3]GENERALIZED WEAKNESS needed 2 people assisatnce to get himoob.will need snf on dc.decrease remeron to 15mg . 4]HTN bp soft on midodrine.increase to tid.dc flomax as he is really not making any urine since admit.was making very little urine at home. 5]HYPOTHYROIDSM nl tsh this admit.continue synthroid. 6]Chronic mild hyponatremia stable.   DVT prophylaxis: coumadin Code Status: he wants to be DNR.he said he went througha hard time in 2010 with his wifes health and she being a full code.he does not want to go through it.HE WANTS TO BE DNR and wants to go home when the [place] takes him. Family Communication: has a brother.not in touch with him.so he really has no family.has 2 friends.he lives alone. Disposition Plan:snf when stable medically.   Consultants: nephro,pt  Procedures: none Antimicrobials:none Subjective:feels weak all over   [place]angerine up in recliner.awake alert.complains of some sob.no c,nausea,vomitting,diarrhea. Vitals:   06/26/17 2141 06/27/17 0012 06/27/17 0340 06/27/17 0507  BP: (!) 81/48 (!) 103/51  103/63  Pulse: (!) 115 (!) 115  (!) 105  Resp: 20   18  Temp: 98.5 F (36.9 C)   98 F (36.7 C)  TempSrc: Oral   Oral  SpO2: 99%   100%  Weight: 94.8 kg (  209 lb)  94.8 kg (209 lb)   Height: 5\' 8"  (1.727 m)       Intake/Output Summary (Last 24 hours) at 06/27/2017 0906 Last data filed at 06/27/2017 8657 Gross per 24 hour  Intake 512.5 ml  Output 50 ml  Net 462.5 ml   Filed Weights   06/26/17 2141 06/27/17 0340  Weight: 94.8 kg (209 lb) 94.8 kg (209 lb)    Examination:  General exam: Appears calm and comfortable  Respiratory system:RHONCHI and crackles  b/l. Cardiovascular system: S1 & S2 heard, RRR. No JVD, murmurs, rubs, gallops or clicks. No pedal edema. Gastrointestinal system: Abdomen is nondistended, soft and nontender. No organomegaly or masses felt. Normal bowel sounds heard. Central nervous system: Alert and oriented. No focal neurological deficits. Extremities 2 PLUS EDEMA Skin: No rashes, lesions or ulcers Psychiatry: Judgement and insight appear normal. Mood & affect appropriate.     Data Reviewed: I have personally reviewed following labs and imaging studies  CBC: Recent Labs  Lab 06/26/17 1057 06/27/17 0300  WBC 8.3 8.4  HGB 12.7* 11.1*  HCT 40.2 34.7*  MCV 96.4 95.1  PLT 161 846*   Basic Metabolic Panel: Recent Labs  Lab 06/26/17 1057 06/27/17 0300  NA 133* 133*  K 4.7 4.8  CL 92* 96*  CO2 24 24  GLUCOSE 99 97  BUN 26* 32*  CREATININE 7.46* 8.25*  CALCIUM 8.5* 8.1*   GFR: Estimated Creatinine Clearance: 6.9 mL/min (A) (by C-G formula based on SCr of 8.25 mg/dL (H)). Liver Function Tests: Recent Labs  Lab 06/26/17 1057  AST 25  ALT 9*  ALKPHOS 498*  BILITOT 0.7  PROT 6.9  ALBUMIN 3.1*   No results for input(s): LIPASE, AMYLASE in the last 168 hours. No results for input(s): AMMONIA in the last 168 hours. Coagulation Profile: Recent Labs  Lab 06/26/17 1057 06/27/17 0300  INR 2.22 2.24   Cardiac Enzymes: No results for input(s): CKTOTAL, CKMB, CKMBINDEX, TROPONINI in the last 168 hours. BNP (last 3 results) No results for input(s): PROBNP in the last 8760 hours. HbA1C: Recent Labs    06/26/17 1923  HGBA1C 5.2   CBG: Recent Labs  Lab 06/26/17 1102  GLUCAP 100*   Lipid Profile: No results for input(s): CHOL, HDL, LDLCALC, TRIG, CHOLHDL, LDLDIRECT in the last 72 hours. Thyroid Function Tests: Recent Labs    06/26/17 1923  TSH 4.496   Anemia Panel: No results for input(s): VITAMINB12, FOLATE, FERRITIN, TIBC, IRON, RETICCTPCT in the last 72 hours. Sepsis Labs: No results  for input(s): PROCALCITON, LATICACIDVEN in the last 168 hours.  Recent Results (from the past 240 hour(s))  MRSA PCR Screening     Status: None   Collection Time: 06/26/17  9:23 PM  Result Value Ref Range Status   MRSA by PCR NEGATIVE NEGATIVE Final    Comment:        The GeneXpert MRSA Assay (FDA approved for NASAL specimens only), is one component of a comprehensive MRSA colonization surveillance program. It is not intended to diagnose MRSA infection nor to guide or monitor treatment for MRSA infections.          Radiology Studies: Dg Chest 2 View  Result Date: 06/26/2017 CLINICAL DATA:  Generalized weakness EXAM: CHEST  2 VIEW COMPARISON:  03/06/2017 FINDINGS: Cardiomegaly. Bibasilar atelectasis or scarring with blunting of left costophrenic angle, likely scarring. No definite visible effusion on the lateral view. No change since prior study. IMPRESSION: Cardiomegaly.  Chronic bibasilar scarring or atelectasis. Electronically  Signed   By: Rolm Baptise M.D.   On: 06/26/2017 11:23   Ct Head Wo Contrast  Result Date: 06/26/2017 CLINICAL DATA:  Generalize weakness for 1 month.  Status post fall. EXAM: CT HEAD WITHOUT CONTRAST TECHNIQUE: Contiguous axial images were obtained from the base of the skull through the vertex without intravenous contrast. COMPARISON:  03/31/2011 FINDINGS: Brain: No evidence of acute infarction, hemorrhage, extra-axial collection, ventriculomegaly, or mass effect. Old left basal ganglia lacunar infarct. Generalized cerebral atrophy. Periventricular white matter low attenuation likely secondary to microangiopathy. Vascular: Cerebrovascular atherosclerotic calcifications are noted. Skull: Negative for fracture or focal lesion. Sinuses/Orbits: Visualized portions of the orbits are unremarkable. Visualized portions of the paranasal sinuses and mastoid air cells are unremarkable. Other: None. IMPRESSION: 1. No acute intracranial pathology. 2. Chronic microvascular  disease and cerebral atrophy. Electronically Signed   By: Kathreen Devoid   On: 06/26/2017 11:46        Scheduled Meds: . allopurinol  100 mg Oral Daily  . amiodarone  200 mg Oral Daily  . atorvastatin  10 mg Oral QPC supper  . diltiazem  240 mg Oral Daily  . doxercalciferol  3 mcg Intravenous Once  . [START ON 06/29/2017] doxercalciferol  3 mcg Intravenous Q M,W,F-HD  . levothyroxine  50 mcg Oral QAC breakfast  . mouth rinse  15 mL Mouth Rinse BID  . medroxyPROGESTERone  5 mg Oral BID  . midodrine  10 mg Oral BID WC  . mirtazapine  30 mg Oral QPC supper  . multivitamin  1 tablet Oral Daily  . sevelamer carbonate  800 mg Oral QPC supper  . sodium chloride flush  3 mL Intravenous Q12H  . tamsulosin  0.4 mg Oral Daily  . vitamin B-12  1,000 mcg Oral QPC supper  . warfarin  2 mg Oral QPM  . Warfarin - Physician Dosing Inpatient   Does not apply q1800   Continuous Infusions: . sodium chloride 50 mL/hr at 06/26/17 2018     LOS: 0 days       Georgette Shell, MD [facility]  If 7PM-7AM, please contact night-coverage [email or web address] Password [email or web address] 06/27/2017, 9:06 AM

## 2017-06-27 NOTE — Progress Notes (Signed)
Nephrology notified of BP below ordered parameters of systolic >59.  Verbal order to received OK to run BP >80 non sypmtomatic.  Will continue to monitor.

## 2017-06-27 NOTE — Progress Notes (Signed)
Patient is transferred to the dialysis.

## 2017-06-27 NOTE — Progress Notes (Signed)
Patient blood pressure 80/50 after returning from dialysis where they removed a liter of fluid.  Patient is asymptomatic.  Placed a call to the physician on call and we will continue to monitor.  Wilson Singer, RN

## 2017-06-27 NOTE — Progress Notes (Signed)
Dialysis treatment completed.  1600 mL ultrafiltrated and net fluid removal 1000 mL.    Patient status unchanged. Lung sounds diminished to ausculation in all fields. BLE 3+ pitting edema. Cardiac: Afib, rvr.  Disconnected lines and removed needles.  Pressure held for 10 minutes and band aid/gauze dressing applied.  Report given to bedside RN, Linton Rump.

## 2017-06-27 NOTE — Progress Notes (Signed)
Patient arrived to unit per bed.  Reviewed treatment plan and this RN agrees.  Report received from bedside RN, Talbert Forest.  Consent obtained.  Patient A & O X 4. Lung sounds diminished to ausculation in all fields. BLE 3+ pitting edema. Cardiac: Afib.  Prepped LUAVF with alcohol and cannulated with two 15 gauge needles.  Pulsation of blood noted.  Flushed access well with saline per protocol.  Connected and secured lines and initiated tx at 1723.  UF goal of 1500 mL and net fluid removal of 1000 mL.  Will continue to monitor.

## 2017-06-28 DIAGNOSIS — I132 Hypertensive heart and chronic kidney disease with heart failure and with stage 5 chronic kidney disease, or end stage renal disease: Secondary | ICD-10-CM | POA: Diagnosis present

## 2017-06-28 DIAGNOSIS — Z992 Dependence on renal dialysis: Secondary | ICD-10-CM | POA: Diagnosis not present

## 2017-06-28 DIAGNOSIS — I953 Hypotension of hemodialysis: Secondary | ICD-10-CM | POA: Diagnosis not present

## 2017-06-28 DIAGNOSIS — E860 Dehydration: Secondary | ICD-10-CM | POA: Diagnosis not present

## 2017-06-28 DIAGNOSIS — R402362 Coma scale, best motor response, obeys commands, at arrival to emergency department: Secondary | ICD-10-CM | POA: Diagnosis present

## 2017-06-28 DIAGNOSIS — N186 End stage renal disease: Secondary | ICD-10-CM | POA: Diagnosis not present

## 2017-06-28 DIAGNOSIS — I959 Hypotension, unspecified: Secondary | ICD-10-CM | POA: Diagnosis not present

## 2017-06-28 DIAGNOSIS — R402252 Coma scale, best verbal response, oriented, at arrival to emergency department: Secondary | ICD-10-CM | POA: Diagnosis present

## 2017-06-28 DIAGNOSIS — I509 Heart failure, unspecified: Secondary | ICD-10-CM | POA: Diagnosis present

## 2017-06-28 DIAGNOSIS — M1990 Unspecified osteoarthritis, unspecified site: Secondary | ICD-10-CM | POA: Diagnosis present

## 2017-06-28 DIAGNOSIS — Z515 Encounter for palliative care: Secondary | ICD-10-CM | POA: Diagnosis not present

## 2017-06-28 DIAGNOSIS — C61 Malignant neoplasm of prostate: Secondary | ICD-10-CM | POA: Diagnosis present

## 2017-06-28 DIAGNOSIS — Z7189 Other specified counseling: Secondary | ICD-10-CM | POA: Diagnosis not present

## 2017-06-28 DIAGNOSIS — E039 Hypothyroidism, unspecified: Secondary | ICD-10-CM | POA: Diagnosis not present

## 2017-06-28 DIAGNOSIS — Z6831 Body mass index (BMI) 31.0-31.9, adult: Secondary | ICD-10-CM | POA: Diagnosis not present

## 2017-06-28 DIAGNOSIS — R531 Weakness: Secondary | ICD-10-CM | POA: Diagnosis present

## 2017-06-28 DIAGNOSIS — M109 Gout, unspecified: Secondary | ICD-10-CM | POA: Diagnosis present

## 2017-06-28 DIAGNOSIS — Z880 Allergy status to penicillin: Secondary | ICD-10-CM | POA: Diagnosis not present

## 2017-06-28 DIAGNOSIS — Z7901 Long term (current) use of anticoagulants: Secondary | ICD-10-CM | POA: Diagnosis not present

## 2017-06-28 DIAGNOSIS — N400 Enlarged prostate without lower urinary tract symptoms: Secondary | ICD-10-CM | POA: Diagnosis present

## 2017-06-28 DIAGNOSIS — E669 Obesity, unspecified: Secondary | ICD-10-CM | POA: Diagnosis present

## 2017-06-28 DIAGNOSIS — E7849 Other hyperlipidemia: Secondary | ICD-10-CM | POA: Diagnosis not present

## 2017-06-28 DIAGNOSIS — E559 Vitamin D deficiency, unspecified: Secondary | ICD-10-CM | POA: Diagnosis present

## 2017-06-28 DIAGNOSIS — E871 Hypo-osmolality and hyponatremia: Secondary | ICD-10-CM | POA: Diagnosis present

## 2017-06-28 DIAGNOSIS — I4891 Unspecified atrial fibrillation: Secondary | ICD-10-CM | POA: Diagnosis not present

## 2017-06-28 DIAGNOSIS — E785 Hyperlipidemia, unspecified: Secondary | ICD-10-CM | POA: Diagnosis present

## 2017-06-28 DIAGNOSIS — Z885 Allergy status to narcotic agent status: Secondary | ICD-10-CM | POA: Diagnosis not present

## 2017-06-28 DIAGNOSIS — R402142 Coma scale, eyes open, spontaneous, at arrival to emergency department: Secondary | ICD-10-CM | POA: Diagnosis present

## 2017-06-28 DIAGNOSIS — Z79890 Hormone replacement therapy (postmenopausal): Secondary | ICD-10-CM | POA: Diagnosis not present

## 2017-06-28 DIAGNOSIS — I9589 Other hypotension: Secondary | ICD-10-CM | POA: Diagnosis not present

## 2017-06-28 DIAGNOSIS — N2581 Secondary hyperparathyroidism of renal origin: Secondary | ICD-10-CM | POA: Diagnosis present

## 2017-06-28 DIAGNOSIS — Z96653 Presence of artificial knee joint, bilateral: Secondary | ICD-10-CM | POA: Diagnosis present

## 2017-06-28 NOTE — Progress Notes (Signed)
CSW spoke with Doristine Bosworth [phone]) from [employer].  Doristine Bosworth stated that [facility] may not have transportation for patient's dialysis on Monday, 06/29/17. Doristine Bosworth will schedule patient's dialysis for remainder of week.  Curt Jews [phone]

## 2017-06-28 NOTE — Progress Notes (Signed)
Steven [name] Progress Note   Dialysis Orders: [facility] MWF EDW 197 lb 2 K 2.5 Ca Qb 350 DFR 500 3.5 hours hectorol 3 Mircera 50 given 11/28 parsabiv 2.5 no heparin left upper AVF  Assessment/Plan: 1. Afib with RVR- afib on tele rate variable low 100s; coumadin therapeutic/on diltiazem 2. ESRD -  MWF - missed Friday -Had HD Saturday back on schedule Monday. 3. BP/volume  - on midodrine for BP support- UF as BP allows; net UF 1 L Saturday - BP limiting factor. Vol overload w/ LE edema, low BP's limiting UF on HD. Midodrine ^'d from 10 bid at home to 10 tid here, max dose.  4. Anemia  - no recent labs avail prior to admission; hgb 11.1  Received Mircera 50 11/28 5. Metabolic bone disease -  Continue Hectorol/binders - parsabiv not avail at [facility]  6. Nutrition - alb 3.1 - added nepro - passed swallowing eval this am 7. Disp - per primary- needs placement 8. Hx prostate cancer -  9. EOL - poor prognosis, DNR  [name], PA-C [place] [phone] 06/28/2017,12:14 PM  LOS: 0 days   Pt seen, examined, agree w assess/plan as above with additions as indicated.  [name] MD [name] pager [phone]    cell [phone] 06/28/2017, 2:09 PM     Subjective:   No problems with HD - agrees he needs rehab  Objective Vitals:   06/28/17 0111 06/28/17 0618 06/28/17 0738 06/28/17 0904  BP: (!) 90/50 (!) 89/50  105/66  Pulse: (!) 103 (!) 110    Resp:  20    Temp:  (!) 97.5 F (36.4 C)    TempSrc:  Oral    SpO2: 97% 98% 98%   Weight:      Height:       Physical Exam General: sitting in chair frail Heart: irreg irreg low 100s Lungs: no rales Abdomen: soft NT Extremities: + 1-2 LE edema Dialysis Access: left upper AVF + soft bruit   Additional Objective Labs: Basic Metabolic Panel: Recent Labs  Lab 06/26/17 1057 06/27/17 0300  NA 133* 133*  K 4.7 4.8  CL 92* 96*  CO2 24 24  GLUCOSE 99 97  BUN 26* 32*  CREATININE  7.46* 8.25*  CALCIUM 8.5* 8.1*   Liver Function Tests: Recent Labs  Lab 06/26/17 1057  AST 25  ALT 9*  ALKPHOS 498*  BILITOT 0.7  PROT 6.9  ALBUMIN 3.1*   CBC: Recent Labs  Lab 06/26/17 1057 06/27/17 0300  WBC 8.3 8.4  HGB 12.7* 11.1*  HCT 40.2 34.7*  MCV 96.4 95.1  PLT 161 138*  CBG: Recent Labs  Lab 06/26/17 1102  GLUCAP 100*    Lab Results  Component Value Date   INR 2.24 06/27/2017   INR 2.22 06/26/2017   INR 3.26 12/13/2016   Studies/Results: No results found. Medications:  . allopurinol  100 mg Oral Daily  . amiodarone  200 mg Oral Daily  . atorvastatin  10 mg Oral QPC supper  . diltiazem  240 mg Oral Daily  . [START ON 06/29/2017] doxercalciferol  3 mcg Intravenous Q M,W,F-HD  . feeding supplement (NEPRO CARB STEADY)  237 mL Oral BID BM  . levothyroxine  50 mcg Oral QAC breakfast  . mouth rinse  15 mL Mouth Rinse BID  . medroxyPROGESTERone  5 mg Oral BID  . midodrine  10 mg Oral TID  . mirtazapine  30 mg Oral QPC supper  .  multivitamin  1 tablet Oral Daily  . sevelamer carbonate  800 mg Oral QPC supper  . sodium chloride flush  3 mL Intravenous Q12H  . vitamin B-12  1,000 mcg Oral QPC supper  . warfarin  2 mg Oral QPM  . Warfarin - Physician Dosing Inpatient   Does not apply [phone]

## 2017-06-28 NOTE — Progress Notes (Signed)
PROGRESS NOTE    Steven [name]  OFB:[id number] DOB: 01/14/1927 DOA: 06/26/2017 PCP: Orpah Melter, MD   Brief Narrative:83 y.o.malewith medical history significanthypertension, hyperlipidemia, end-stage renal disease on hemodialysis Monday Wednesdays and Fridays, atrial fibrillation on Coumadin, who presents to the ED with a complaint of weakness for about a month and falling this morning. Patient has a Foley in place because he continues to make some urine. He also has a history of hypertensive heart disease and takes Lasix. Today the patient got up from his couch to go to the bathroom and he fell on the floor. An ambulance was called. He denies any focal weakness but does complain of generalized weakness. He has had some headaches which have been unusual for him he usually does not have these. He complains of a baseline cough but denies any fevers vomiting or diarrhea. He has no skin rashes. He has chronic lower extremity edema and states that his previous dialysis session was normal. Symptoms came on acutely this. But the course has been insidious with the weakness. ED Course:Patient was found to be unable to stand or ambulate. It was noted that he has a nurse who comes to his house once a month change of catheter and he has 2 family friends who live nearby and are elderly who assist him with some tasks but are unable to care for him long-term. He was found to be in atrial fibrillation with rapid response and IV Cardizem was given without much response. His EKG remained with an atrial fibrillation at a heart rate of 125. Given his recent profound weakness inability to care for himself and uncontrolled atrial fibrillation the patient will be admitted into the hospital for further evaluation and management.     Assessment & Plan:   Principal Problem:   Atrial fibrillation with rapid ventricular response (HCC) Active Problems:   Obesity (BMI 30-39.9)   Hypertensive heart  disease   Hyperlipidemia   End-stage renal disease on hemodialysis (HCC)   Generalized weakness   Dehydration  1]afib on cardizem and amio,coumadin.hear rate better. 2]esrd on hd m/w/f 3]generalized weakness 4]hypotension on midodrine.dose increased.   DVT prophylaxis: coumadin Code Status: dnr.he does not want any aggressive measures. Family Communication: no family Disposition Plan: will consult sw to find a snf for snf placement.plan discharge tomorrow after dialysis if snf placement can be found by then.   Consultants: nephro  Procedures: none Antimicrobials: none Subjective:feels light headed standing up but ready to go to rehab   [place] up in bed saying he is hungry.no chest pain,sob. Vitals:   06/28/17 0111 06/28/17 0618 06/28/17 0738 06/28/17 0904  BP: (!) 90/50 (!) 89/50  105/66  Pulse: (!) 103 (!) 110    Resp:  20    Temp:  (!) 97.5 F (36.4 C)    TempSrc:  Oral    SpO2: 97% 98% 98%   Weight:      Height:        Intake/Output Summary (Last 24 hours) at 06/28/2017 0922 Last data filed at 06/27/2017 2025 Gross per 24 hour  Intake -  Output 1000 ml  Net -1000 ml   Filed Weights   06/27/17 0340 06/27/17 1707 06/27/17 2023  Weight: 94.8 kg (209 lb) 95 kg (209 lb 7 oz) 94 kg (207 lb 3.7 oz)    Examination:  General exam: Appears calm and comfortable  Respiratory system: Clear to auscultation. Respiratory effort normal. Cardiovascular system: S1 & S2 heard, RRR. No JVD, murmurs, rubs, gallops  or clicks. No pedal edema. Gastrointestinal system: Abdomen is nondistended, soft and nontender. No organomegaly or masses felt. Normal bowel sounds heard. Central nervous system: Alert and oriented. No focal neurological deficits. Extremities: Symmetric 5 x 5 power. Skin: No rashes, lesions or ulcers Psychiatry: Judgement and insight appear normal. Mood & affect appropriate.     Data Reviewed: I have personally reviewed following labs and imaging  studies  CBC: Recent Labs  Lab 06/26/17 1057 06/27/17 0300  WBC 8.3 8.4  HGB 12.7* 11.1*  HCT 40.2 34.7*  MCV 96.4 95.1  PLT 161 409*   Basic Metabolic Panel: Recent Labs  Lab 06/26/17 1057 06/27/17 0300  NA 133* 133*  K 4.7 4.8  CL 92* 96*  CO2 24 24  GLUCOSE 99 97  BUN 26* 32*  CREATININE 7.46* 8.25*  CALCIUM 8.5* 8.1*   GFR: Estimated Creatinine Clearance: 6.9 mL/min (A) (by C-G formula based on SCr of 8.25 mg/dL (H)). Liver Function Tests: Recent Labs  Lab 06/26/17 1057  AST 25  ALT 9*  ALKPHOS 498*  BILITOT 0.7  PROT 6.9  ALBUMIN 3.1*   No results for input(s): LIPASE, AMYLASE in the last 168 hours. No results for input(s): AMMONIA in the last 168 hours. Coagulation Profile: Recent Labs  Lab 06/26/17 1057 06/27/17 0300  INR 2.22 2.24   Cardiac Enzymes: No results for input(s): CKTOTAL, CKMB, CKMBINDEX, TROPONINI in the last 168 hours. BNP (last 3 results) No results for input(s): PROBNP in the last 8760 hours. HbA1C: Recent Labs    06/26/17 1923  HGBA1C 5.2   CBG: Recent Labs  Lab 06/26/17 1102  GLUCAP 100*   Lipid Profile: No results for input(s): CHOL, HDL, LDLCALC, TRIG, CHOLHDL, LDLDIRECT in the last 72 hours. Thyroid Function Tests: Recent Labs    06/26/17 1923  TSH 4.496   Anemia Panel: No results for input(s): VITAMINB12, FOLATE, FERRITIN, TIBC, IRON, RETICCTPCT in the last 72 hours. Sepsis Labs: No results for input(s): PROCALCITON, LATICACIDVEN in the last 168 hours.  Recent Results (from the past 240 hour(s))  MRSA PCR Screening     Status: None   Collection Time: 06/26/17  9:23 PM  Result Value Ref Range Status   MRSA by PCR NEGATIVE NEGATIVE Final    Comment:        The GeneXpert MRSA Assay (FDA approved for NASAL specimens only), is one component of a comprehensive MRSA colonization surveillance program. It is not intended to diagnose MRSA infection nor to guide or monitor treatment for MRSA infections.           Radiology Studies: Dg Chest 2 View  Result Date: 06/26/2017 CLINICAL DATA:  Generalized weakness EXAM: CHEST  2 VIEW COMPARISON:  03/06/2017 FINDINGS: Cardiomegaly. Bibasilar atelectasis or scarring with blunting of left costophrenic angle, likely scarring. No definite visible effusion on the lateral view. No change since prior study. IMPRESSION: Cardiomegaly.  Chronic bibasilar scarring or atelectasis. Electronically Signed   By: Rolm Baptise M.D.   On: 06/26/2017 11:23   Ct Head Wo Contrast  Result Date: 06/26/2017 CLINICAL DATA:  Generalize weakness for 1 month.  Status post fall. EXAM: CT HEAD WITHOUT CONTRAST TECHNIQUE: Contiguous axial images were obtained from the base of the skull through the vertex without intravenous contrast. COMPARISON:  03/31/2011 FINDINGS: Brain: No evidence of acute infarction, hemorrhage, extra-axial collection, ventriculomegaly, or mass effect. Old left basal ganglia lacunar infarct. Generalized cerebral atrophy. Periventricular white matter low attenuation likely secondary to microangiopathy. Vascular: Cerebrovascular atherosclerotic calcifications  are noted. Skull: Negative for fracture or focal lesion. Sinuses/Orbits: Visualized portions of the orbits are unremarkable. Visualized portions of the paranasal sinuses and mastoid air cells are unremarkable. Other: None. IMPRESSION: 1. No acute intracranial pathology. 2. Chronic microvascular disease and cerebral atrophy. Electronically Signed   By: Kathreen Devoid   On: 06/26/2017 11:46        Scheduled Meds: . allopurinol  100 mg Oral Daily  . amiodarone  200 mg Oral Daily  . atorvastatin  10 mg Oral QPC supper  . diltiazem  240 mg Oral Daily  . [START ON 06/29/2017] doxercalciferol  3 mcg Intravenous Q M,W,F-HD  . feeding supplement (NEPRO CARB STEADY)  237 mL Oral BID BM  . levothyroxine  50 mcg Oral QAC breakfast  . mouth rinse  15 mL Mouth Rinse BID  . medroxyPROGESTERone  5 mg Oral BID  .  midodrine  10 mg Oral TID  . mirtazapine  30 mg Oral QPC supper  . multivitamin  1 tablet Oral Daily  . sevelamer carbonate  800 mg Oral QPC supper  . sodium chloride flush  3 mL Intravenous Q12H  . vitamin B-12  1,000 mcg Oral QPC supper  . warfarin  2 mg Oral QPM  . Warfarin - Physician Dosing Inpatient   Does not apply q1800   Continuous Infusions:   LOS: 0 days    Georgette Shell, MD [facility]  If 7PM-7AM, please contact night-coverage [email or web address] Password [email or web address] 06/28/2017, 9:22 AM

## 2017-06-29 LAB — CBC
HEMATOCRIT: 37.6 % — AB (ref 39.0–52.0)
HEMOGLOBIN: 11.8 g/dL — AB (ref 13.0–17.0)
MCH: 30.3 pg (ref 26.0–34.0)
MCHC: 31.4 g/dL (ref 30.0–36.0)
MCV: 96.4 fL (ref 78.0–100.0)
PLATELETS: 134 10*3/uL — AB (ref 150–400)
RBC: 3.9 MIL/uL — AB (ref 4.22–5.81)
RDW: 17.6 % — ABNORMAL HIGH (ref 11.5–15.5)
WBC: 6.4 10*3/uL (ref 4.0–10.5)

## 2017-06-29 LAB — RENAL FUNCTION PANEL
ANION GAP: 13 (ref 5–15)
Albumin: 2.7 g/dL — ABNORMAL LOW (ref 3.5–5.0)
BUN: 34 mg/dL — ABNORMAL HIGH (ref 6–20)
CHLORIDE: 95 mmol/L — AB (ref 101–111)
CO2: 26 mmol/L (ref 22–32)
CREATININE: 7.29 mg/dL — AB (ref 0.61–1.24)
Calcium: 8.4 mg/dL — ABNORMAL LOW (ref 8.9–10.3)
GFR, EST AFRICAN AMERICAN: 7 mL/min — AB (ref >60)
GFR, EST NON AFRICAN AMERICAN: 6 mL/min — AB (ref >60)
Glucose, Bld: 91 mg/dL (ref 65–99)
POTASSIUM: 4.3 mmol/L (ref 3.5–5.1)
Phosphorus: 2.7 mg/dL (ref 2.5–4.6)
Sodium: 134 mmol/L — ABNORMAL LOW (ref 135–145)

## 2017-06-29 LAB — PROTIME-INR
INR: 2.63
Prothrombin Time: 27.9 seconds — ABNORMAL HIGH (ref 11.4–15.2)

## 2017-06-29 LAB — HEPATITIS B SURFACE ANTIGEN: HEP B S AG: NEGATIVE

## 2017-06-29 MED ORDER — LIDOCAINE HCL (PF) 1 % IJ SOLN
5.0000 mL | INTRAMUSCULAR | Status: DC | PRN
  Filled 2017-06-29: qty 5

## 2017-06-29 MED ORDER — ALTEPLASE 2 MG IJ SOLR
2.0000 mg | Freq: Once | INTRAMUSCULAR | Status: DC | PRN
  Filled 2017-06-29: qty 2

## 2017-06-29 MED ORDER — PENTAFLUOROPROP-TETRAFLUOROETH EX AERO: 1.0000 "application " | INHALATION_SPRAY | CUTANEOUS | Status: DC | PRN

## 2017-06-29 MED ORDER — SODIUM CHLORIDE 0.9 % IV SOLN: 100.0000 mL | INTRAVENOUS | Status: DC | PRN

## 2017-06-29 MED ORDER — DOXERCALCIFEROL 4 MCG/2ML IV SOLN
INTRAVENOUS | Status: AC
  Administered 2017-06-29: 3 ug via INTRAVENOUS
  Filled 2017-06-29: qty 2

## 2017-06-29 MED ORDER — MIDODRINE HCL 5 MG PO TABS
ORAL_TABLET | ORAL | Status: AC
  Administered 2017-06-29: 10 mg via ORAL
  Filled 2017-06-29: qty 2

## 2017-06-29 MED ORDER — HEPARIN SODIUM (PORCINE) 1000 UNIT/ML DIALYSIS
1000.0000 [IU] | INTRAMUSCULAR | Status: DC | PRN
  Filled 2017-06-29: qty 1

## 2017-06-29 MED ORDER — LIDOCAINE-PRILOCAINE 2.5-2.5 % EX CREA
1.0000 "application " | TOPICAL_CREAM | CUTANEOUS | Status: DC | PRN
  Filled 2017-06-29: qty 5

## 2017-06-29 NOTE — Procedures (Signed)
Patient seen and examined on Hemodialysis. QB 400 mL/ min, UF goal 2L but UF off for BP 80s/ 40s.  Unlikely we will get needed vol off.  Treatment adjusted as needed.  Madelon Lips MD [facility] pgr [phone] 12:12 PM

## 2017-06-29 NOTE — Progress Notes (Signed)
OT Cancellation Note  Patient Details Name: Steven [name] MRN: [id number] DOB: [birth year]   Cancelled Treatment:    Reason Eval/Treat Not Completed: Patient at procedure or test/ unavailable OT to reattempt as time allows pending patients time in HD  Peri Maris 06/29/2017, 11:36 AM  [phone]

## 2017-06-29 NOTE — Progress Notes (Addendum)
PROGRESS NOTE    Steven [name]  BZJ:[id number] DOB: 1926-10-03 DOA: 06/26/2017 PCP: Orpah Melter, MD   Brief Narrative:83 y.o.malewith medical history significanthypertension, hyperlipidemia, end-stage renal disease on hemodialysis Monday Wednesdays and Fridays, atrial fibrillation on Coumadin, who presented to the ED with a complaint of weakness for about a month and falls. Patient has a Foley in place because he continues to make some urine. He also has a history of hypertensive heart disease and takes Lasix. 11/30  the patient got up from his couch to go to the bathroom and he fell on the floor. He has chronic lower extremity edema and states that his previous dialysis session was normal.  ED Course:Patient was found to be unable to stand or ambulate. It was noted that he has a nurse who comes to his house once a month change of catheter and he has 2 family friends who live nearby and are elderly who assist him with some tasks but are unable to care for him long-term. He was found to be Hypotensive, and in atrial fibrillation with rapid response and IV Cardizem was given without much response. His EKG remained with an atrial fibrillation at a heart rate of 125.   Assessment & Plan:   Principal Problem:   Atrial fibrillation with rapid ventricular response (HCC) -his blood pressure is his main limiting factor which interferes with volume removal on hemodialysis and ability to take his rate controlling medicines - He is on Cardizem and Toprol which he takes on nondialysis days at home -and also on amiodarone, continue same -continue Cardizem, toprol held due to low BPs -continue midodrine was on 10 mg twice a day at home now on 10 mg 3 times a day which is maximal dose -history has failed multiple rounds of DC cardioversion  Chronic hypotension -this has been a long-standing problem -continue midodrine, increase dose -his ejection fraction was 55% based on echocardiogram in  01/2016  ESRD on HD MWF -clinically volume overloaded however has hypotension which limits volume removal -Renal following -called and discussed with nephrology unable to be dialyzed optimally due to hypotension, options significantly limited to request palliative consult for goals of care   Obesity (BMI 30-39.9)   Severe deconditioning/debility -Due to advanced age/ESRD in all above comorbidities -plan for SNF fof rehab when stable, think he will benefit from Palliative eval  History of prostate cancer/ and urinary retention -has Chronic Foley, still makes some urine and takes Lasix -no symptoms of UTI at this time, follow-up with urology  DVT prophylaxis: coumadin Code Status: dnr.he does not want any aggressive measures. Family Communication: no family Disposition Plan: to be determined  Subjective:feels weak and tired, denies any nausea vomiting or diarrhea, chronically short of breath  Vitals:   06/29/17 1045 06/29/17 1057 06/29/17 1115 06/29/17 1130  BP: (!) 84/51 (!) 74/47 (!) 87/51 (!) 103/58  Pulse: (!) 110 (!) 104 (!) 107 (!) 107  Resp: 20 (!) 24 (!) 22 19  Temp: 97.8 F (36.6 C)     TempSrc: Oral     SpO2: 100%     Weight: 94 kg (207 lb 3.7 oz)     Height:        Intake/Output Summary (Last 24 hours) at 06/29/2017 1140 Last data filed at 06/28/2017 2003 Gross per 24 hour  Intake 236 ml  Output -  Net 236 ml   Filed Weights   06/27/17 1707 06/27/17 2023 06/29/17 1045  Weight: 95 kg (209 lb 7 oz) 94  kg (207 lb 3.7 oz) 94 kg (207 lb 3.7 oz)    Examination:  Gen: obese chronically ill elderly male uncomfortable appearing AAO 3 HEENT: PERRLA, Neck supple Lungs: decreased breath sounds in both bases CVS: RRR,No Gallops,Rubs or new Murmurs Abd: soft, Non tender, non distended, BS present Extremities: 2+ edema Skin: no new rashes   Data Reviewed: I have personally reviewed following labs and imaging studies  CBC: Recent Labs  Lab 06/26/17 1057  06/27/17 0300 06/29/17 0902  WBC 8.3 8.4 6.4  HGB 12.7* 11.1* 11.8*  HCT 40.2 34.7* 37.6*  MCV 96.4 95.1 96.4  PLT 161 138* 097*   Basic Metabolic Panel: Recent Labs  Lab 06/26/17 1057 06/27/17 0300 06/29/17 0902  NA 133* 133* 134*  K 4.7 4.8 4.3  CL 92* 96* 95*  CO2 24 24 26   GLUCOSE 99 97 91  BUN 26* 32* 34*  CREATININE 7.46* 8.25* 7.29*  CALCIUM 8.5* 8.1* 8.4*  PHOS  --   --  2.7   GFR: Estimated Creatinine Clearance: 7.8 mL/min (A) (by C-G formula based on SCr of 7.29 mg/dL (H)). Liver Function Tests: Recent Labs  Lab 06/26/17 1057 06/29/17 0902  AST 25  --   ALT 9*  --   ALKPHOS 498*  --   BILITOT 0.7  --   PROT 6.9  --   ALBUMIN 3.1* 2.7*   No results for input(s): LIPASE, AMYLASE in the last 168 hours. No results for input(s): AMMONIA in the last 168 hours. Coagulation Profile: Recent Labs  Lab 06/26/17 1057 06/27/17 0300 06/29/17 0319  INR 2.22 2.24 2.63   Cardiac Enzymes: No results for input(s): CKTOTAL, CKMB, CKMBINDEX, TROPONINI in the last 168 hours. BNP (last 3 results) No results for input(s): PROBNP in the last 8760 hours. HbA1C: Recent Labs    06/26/17 1923  HGBA1C 5.2   CBG: Recent Labs  Lab 06/26/17 1102  GLUCAP 100*   Lipid Profile: No results for input(s): CHOL, HDL, LDLCALC, TRIG, CHOLHDL, LDLDIRECT in the last 72 hours. Thyroid Function Tests: Recent Labs    06/26/17 1923  TSH 4.496   Anemia Panel: No results for input(s): VITAMINB12, FOLATE, FERRITIN, TIBC, IRON, RETICCTPCT in the last 72 hours. Sepsis Labs: No results for input(s): PROCALCITON, LATICACIDVEN in the last 168 hours.  Recent Results (from the past 240 hour(s))  MRSA PCR Screening     Status: None   Collection Time: 06/26/17  9:23 PM  Result Value Ref Range Status   MRSA by PCR NEGATIVE NEGATIVE Final    Comment:        The GeneXpert MRSA Assay (FDA approved for NASAL specimens only), is one component of a comprehensive MRSA  colonization surveillance program. It is not intended to diagnose MRSA infection nor to guide or monitor treatment for MRSA infections.          Radiology Studies: No results found.      Scheduled Meds: . allopurinol  100 mg Oral Daily  . amiodarone  200 mg Oral Daily  . atorvastatin  10 mg Oral QPC supper  . diltiazem  240 mg Oral Daily  . doxercalciferol  3 mcg Intravenous Q M,W,F-HD  . feeding supplement (NEPRO CARB STEADY)  237 mL Oral BID BM  . levothyroxine  50 mcg Oral QAC breakfast  . mouth rinse  15 mL Mouth Rinse BID  . medroxyPROGESTERone  5 mg Oral BID  . midodrine  10 mg Oral TID  . mirtazapine  30 mg  Oral QPC supper  . multivitamin  1 tablet Oral Daily  . sevelamer carbonate  800 mg Oral QPC supper  . sodium chloride flush  3 mL Intravenous Q12H  . vitamin B-12  1,000 mcg Oral QPC supper  . warfarin  2 mg Oral QPM  . Warfarin - Physician Dosing Inpatient   Does not apply q1800   Continuous Infusions: . sodium chloride    . sodium chloride       LOS: 1 day    Domenic Polite, MD [facility]  If 7PM-7AM, please contact night-coverage [email or web address] Password [email or web address] 06/29/2017, 11:40 AM

## 2017-06-29 NOTE — NC FL2 (Signed)
[place] LEVEL OF CARE SCREENING TOOL     IDENTIFICATION  Patient Name: Steven [name] Birthdate: 1927/04/28 Sex: male Admission Date (Current Location): 06/26/2017  [facility] and [place] Number:  Herbalist and Address:  [facility] [place], [place] [address] [place], [place] [address]      Provider Number: [id number]  Attending Physician Name and Address:  Domenic Polite, MD  Relative Name and Phone Number:  Inez [place] friend, [phone]    Current Level of Care: Hospital Recommended Level of Care: [place] Prior Approval Number:    Date Approved/Denied:   PASRR Number: [id number]  Discharge Plan: SNF    Current Diagnoses: Patient Active Problem List   Diagnosis Date Noted  . Dehydration 06/26/2017  . Atrial fibrillation with rapid ventricular response ([place])   . ESRD on dialysis ([place])   . Essential hypertension   . Acute respiratory failure with hypoxia ([place])   . Generalized weakness   . Shortness of breath 12/08/2016  . End-stage renal disease on hemodialysis ([place]) 12/08/2016  . Chronic kidney disease, stage IV (severe) ([place])   . Long term current use of anticoagulant therapy   . Hyperlipidemia   . Prostate cancer ([place])   . Acute blood loss anemia 06/08/2013  . Obesity (BMI 30-39.9)   . Atrial fibrillation ([place])   . Hypertensive heart disease   . Mitral valve disease   . S/P right TKA 06/06/2013    Orientation RESPIRATION BLADDER Height & Weight     Self, Time, Situation, Place  Normal Incontinent, Indwelling catheter Weight: 94 kg (207 lb 3.7 oz) Height:  5\' 8"  (172.7 cm)  BEHAVIORAL SYMPTOMS/MOOD NEUROLOGICAL BOWEL NUTRITION STATUS      Continent Diet(Please see DC Summary)  AMBULATORY STATUS COMMUNICATION OF NEEDS Skin   Limited Assist Verbally Normal                       Personal Care Assistance Level of Assistance  Bathing, Feeding, Dressing Bathing Assistance: Maximum  assistance Feeding assistance: Limited assistance Dressing Assistance: Limited assistance     Functional Limitations Info             SPECIAL CARE FACTORS FREQUENCY  PT (By licensed PT)     PT Frequency: 5x/week              Contractures      Additional Factors Info  Code Status, Allergies Code Status Info: DNR Allergies Info: Hydrocodone, Methocarbamol, Penicillins           Current Medications (06/29/2017):  This is the current hospital active medication list Current Facility-Administered Medications  Medication Dose Route Frequency Provider Last Rate Last Dose  . 0.9 %  sodium chloride infusion  100 mL Intravenous PRN Alric Seton, PA-C      . 0.9 %  sodium chloride infusion  100 mL Intravenous PRN Alric Seton, PA-C      . acetaminophen (TYLENOL) tablet 1,000 mg  1,000 mg Oral Q6H PRN Lady Deutscher, MD      . allopurinol (ZYLOPRIM) tablet 100 mg  100 mg Oral Daily Lady Deutscher, MD   100 mg at 06/28/17 5009  . alteplase (CATHFLO ACTIVASE) injection 2 mg  2 mg Intracatheter Once PRN Alric Seton, PA-C      . amiodarone (PACERONE) tablet 200 mg  200 mg Oral Daily Lady Deutscher, MD   200 mg at 06/28/17 0904  . atorvastatin (LIPITOR) tablet 10  mg  10 mg Oral QPC supper Lady Deutscher, MD   10 mg at 06/28/17 1704  . diltiazem (CARDIZEM CD) 24 hr capsule 240 mg  240 mg Oral Daily Lady Deutscher, MD   240 mg at 06/28/17 0904  . doxercalciferol (HECTOROL) injection 3 mcg  3 mcg Intravenous Q M,W,F-HD Alric Seton, PA-C      . feeding supplement (NEPRO CARB STEADY) liquid 237 mL  237 mL Oral BID BM Alric Seton, PA-C   237 mL at 06/28/17 1710  . heparin injection 1,000 Units  1,000 Units Dialysis PRN Alric Seton, PA-C      . levalbuterol Penne Lash) nebulizer solution 0.63 mg  0.63 mg Nebulization Q6H PRN Georgette Shell, MD      . levothyroxine (SYNTHROID, LEVOTHROID) tablet 50 mcg  50 mcg Oral QAC breakfast Lady Deutscher,  MD   50 mcg at 06/28/17 8786  . lidocaine (PF) (XYLOCAINE) 1 % injection 5 mL  5 mL Intradermal PRN Alric Seton, PA-C      . lidocaine-prilocaine (EMLA) cream 1 application  1 application Topical PRN Alric Seton, PA-C      . MEDLINE mouth rinse  15 mL Mouth Rinse BID Lady Deutscher, MD   15 mL at 06/28/17 2102  . medroxyPROGESTERone (PROVERA) tablet 5 mg  5 mg Oral BID Lady Deutscher, MD   5 mg at 06/28/17 2102  . midodrine (PROAMATINE) tablet 10 mg  10 mg Oral TID Georgette Shell, MD   10 mg at 06/29/17 0601  . mirtazapine (REMERON) tablet 30 mg  30 mg Oral QPC supper Lady Deutscher, MD   30 mg at 06/28/17 1704  . multivitamin (RENA-VIT) tablet 1 tablet  1 tablet Oral Daily Lady Deutscher, MD   1 tablet at 06/28/17 7672  . ondansetron (ZOFRAN) tablet 4 mg  4 mg Oral Q6H PRN Lady Deutscher, MD       Or  . ondansetron [facility]) injection 4 mg  4 mg Intravenous Q6H PRN Lady Deutscher, MD      . pentafluoroprop-tetrafluoroeth Landry Dyke) aerosol 1 application  1 application Topical PRN Alric Seton, PA-C      . polyethylene glycol (MIRALAX / GLYCOLAX) packet 17 g  17 g Oral Daily PRN Lady Deutscher, MD      . sevelamer carbonate (RENVELA) tablet 800 mg  800 mg Oral QPC supper Lady Deutscher, MD   800 mg at 06/28/17 1705  . sodium chloride flush (NS) 0.9 % injection 3 mL  3 mL Intravenous Q12H Lady Deutscher, MD   3 mL at 06/28/17 2103  . vitamin B-12 (CYANOCOBALAMIN) tablet 1,000 mcg  1,000 mcg Oral QPC supper Lady Deutscher, MD   1,000 mcg at 06/28/17 1704  . warfarin (COUMADIN) tablet 2 mg  2 mg Oral QPM Lady Deutscher, MD   2 mg at 06/28/17 1704  . Warfarin - Physician Dosing Inpatient   Does not apply q1800 Lady Deutscher, MD         Discharge Medications: Please see discharge summary for a list of discharge medications.  Relevant Imaging Results:  Relevant Lab Results:   Additional Information SSN: [place]  [place], [place]

## 2017-06-29 NOTE — Progress Notes (Signed)
   [facility] Progress Note   Assessment/ Plan:   1. Afib with RVR-afib on tele rate variable low 100s; coumadin therapeutic/on diltiazem and amiodarone, no BB due to hypotension 2. ESRD- MWF - missed Friday -Had HD Saturday back on schedule Monday (today) 3. BP/volume- on midodrine for BP support and we have uptitrated to max dose 10 TID 4. Anemia- no recent labs avail prior to admission; hgb 11.1 Received Mircera 50 11/28 5. Metabolic bone disease- Continue Hectorol/binders - parsabiv not avail at [facility] 6. Nutrition- alb 3.1 - addednepro - passed swallowing eval  7. Hx prostate cancer -  8. EOL - poor prognosis, DNR.  Discussed with primary team.  Will call palliative- I forsee his hypotension being the limiting factor for UF and perhaps will eventually preclude safe dialysis.     Subjective:    Remains persistently hypotensive.  He has no complaints.     Objective:   BP (!) 103/58   Pulse (!) 107   Temp 97.8 F (36.6 C) (Oral)   Resp 19   Ht 5\' 8"  (1.727 m)   Wt 94 kg (207 lb 3.7 oz)   SpO2 100%   BMI 31.51 kg/m   Physical Exam: General: sitting on HD, frail elderly man Heart: irreg irreg low 100s Lungs: no rales Abdomen: soft NT Extremities: 2+ LE edema Dialysis Access: left upper AVF + soft bruit    Labs: BMET Recent Labs  Lab 06/26/17 1057 06/27/17 0300 06/29/17 0902  NA 133* 133* 134*  K 4.7 4.8 4.3  CL 92* 96* 95*  CO2 24 24 26   GLUCOSE 99 97 91  BUN 26* 32* 34*  CREATININE 7.46* 8.25* 7.29*  CALCIUM 8.5* 8.1* 8.4*  PHOS  --   --  2.7   CBC Recent Labs  Lab 06/26/17 1057 06/27/17 0300 06/29/17 0902  WBC 8.3 8.4 6.4  HGB 12.7* 11.1* 11.8*  HCT 40.2 34.7* 37.6*  MCV 96.4 95.1 96.4  PLT 161 138* 134*    @IMGRELPRIORS @ Medications:    . allopurinol  100 mg Oral Daily  . amiodarone  200 mg Oral Daily  . atorvastatin  10 mg Oral QPC supper  . diltiazem  240 mg Oral Daily  . doxercalciferol  3 mcg Intravenous Q  M,W,F-HD  . feeding supplement (NEPRO CARB STEADY)  237 mL Oral BID BM  . levothyroxine  50 mcg Oral QAC breakfast  . mouth rinse  15 mL Mouth Rinse BID  . medroxyPROGESTERone  5 mg Oral BID  . midodrine  10 mg Oral TID  . mirtazapine  30 mg Oral QPC supper  . multivitamin  1 tablet Oral Daily  . sevelamer carbonate  800 mg Oral QPC supper  . sodium chloride flush  3 mL Intravenous Q12H  . vitamin B-12  1,000 mcg Oral QPC supper  . warfarin  2 mg Oral QPM  . Warfarin - Physician Dosing Inpatient   Does not apply [place], MD [place] pgr [phone] 06/29/2017, 12:08 PM

## 2017-06-30 ENCOUNTER — Telehealth: Payer: Self-pay

## 2017-06-30 DIAGNOSIS — I9589 Other hypotension: Secondary | ICD-10-CM

## 2017-06-30 DIAGNOSIS — E860 Dehydration: Secondary | ICD-10-CM

## 2017-06-30 DIAGNOSIS — Z515 Encounter for palliative care: Secondary | ICD-10-CM

## 2017-06-30 DIAGNOSIS — Z7189 Other specified counseling: Secondary | ICD-10-CM

## 2017-06-30 LAB — PROTIME-INR
INR: 2.24
PROTHROMBIN TIME: 24.6 s — AB (ref 11.4–15.2)

## 2017-06-30 LAB — HEPATITIS B SURFACE ANTIBODY,QUALITATIVE: Hep B S Ab: NONREACTIVE

## 2017-06-30 NOTE — Progress Notes (Signed)
Physical Therapy Treatment Patient Details Name: Steven [name] MRN: [id number] DOB: Jul 25, 1927 Today's Date: 06/30/2017    History of Present Illness Pt is an 83 y/o male admitted secondary to weakness and falls at home. Upon arrival, pt found to be in a-fib with RVR. PMH including but not limited to ESRD, HTN and HLD.    PT Comments    Pt is progressing towards his goals however is limited by fatigue possibly as a result of low BP although BP increased slightly with activity (see vitals below). Pt currently modA for sit<>stands in [place], therapy unable to progress with ambulation due to decreased strength and fatigue.  Given pt's current limitations D/C plan remains appropriate. PT will continue to follow pt acutely.    Follow Up Recommendations  SNF     Equipment Recommendations  None recommended by PT    Recommendations for Other Services       Precautions / Restrictions Precautions Precautions: Fall Restrictions Weight Bearing Restrictions: No    Mobility  Bed Mobility               General bed mobility comments: OOB in recliner upon arrival   Transfers Overall transfer level: Needs assistance Equipment used: 2 person hand held assist Transfers: Sit to/from Stand Sit to Stand: Mod assist;+2 physical assistance         General transfer comment: Pt completed sit<>stand x2 during session at [facility] with ModA +2, assist to rise and steady in standing, pt unable to tolerate increased pressure on knees in [place] and was returned to recliner            Balance Overall balance assessment: Needs assistance;History of Falls Sitting-balance support: Feet supported Sitting balance-Leahy Scale: Fair     Standing balance support: During functional activity;Bilateral upper extremity supported Standing balance-Leahy Scale: Poor Standing balance comment: reliant on bilateral UE supports and mod-max A x2                            Cognition  Arousal/Alertness: Awake/alert Behavior During Therapy: WFL for tasks assessed/performed Overall Cognitive Status: Impaired/Different from baseline                       Memory: Decreased short-term memory Following Commands: Follows one step commands with increased time Safety/Judgement: Decreased awareness of safety;Decreased awareness of deficits   Problem Solving: Slow processing;Decreased initiation;Difficulty sequencing;Requires verbal cues;Requires tactile cues        Exercises General Exercises - Upper Extremity Shoulder Flexion: AROM;Both;10 reps Elbow Flexion: AROM;Both;10 reps;Seated Elbow Extension: AROM;Both;10 reps;Seated General Exercises - Lower Extremity Long Arc Quad: AROM;Both;10 reps;Seated Hip Flexion/Marching: AROM;Both;10 reps;Seated    General Comments General comments (skin integrity, edema, etc.): Nursing notified therapy of low BP at rest in seated 74/50, after seated exercise 78/61, in standing 83/54      Pertinent Vitals/Pain Pain Assessment: Faces Faces Pain Scale: Hurts little more Pain Location: bil knees, LUE  Pain Descriptors / Indicators: Sore Pain Intervention(s): Monitored during session;Limited activity within patient's tolerance;Repositioned    Home Living Family/patient expects to be discharged to:: Skilled nursing facility Living Arrangements: Alone                  Prior Function Level of Independence: Needs assistance  Gait / Transfers Assistance Needed: ambulates short distances with RW, uses w/c on dialysis days ADL's / Homemaking Assistance Needed: requires assistance from caregiver (unsure of how much, pt  very vague and very HOH)     PT Goals (current goals can now be found in the care plan section) Acute Rehab PT Goals Patient Stated Goal: to get stronger PT Goal Formulation: With patient Time For Goal Achievement: 07/11/17 Potential to Achieve Goals: Fair Progress towards PT goals: Progressing toward  goals    Frequency    Min 2X/week      PT Plan      Co-evaluation PT/OT/SLP Co-Evaluation/Treatment: Yes Reason for Co-Treatment: Complexity of the patient's impairments (multi-system involvement);For patient/therapist safety;To address functional/ADL transfers PT goals addressed during session: Mobility/safety with mobility OT goals addressed during session: ADL's and self-care      AM-PAC PT "6 Clicks" Daily Activity  Outcome Measure  Difficulty turning over in bed (including adjusting bedclothes, sheets and blankets)?: Unable Difficulty moving from lying on back to sitting on the side of the bed? : Unable Difficulty sitting down on and standing up from a chair with arms (e.g., wheelchair, bedside commode, etc,.)?: Unable Help needed moving to and from a bed to chair (including a wheelchair)?: A Lot Help needed walking in hospital room?: A Lot Help needed climbing 3-5 steps with a railing? : Total 6 Click Score: 8    End of Session Equipment Utilized During Treatment: Gait belt Activity Tolerance: Patient limited by fatigue Patient left: in chair;with call bell/phone within reach;with chair alarm set Nurse Communication: Mobility status;Need for lift equipment PT Visit Diagnosis: Other abnormalities of gait and mobility (R26.89)     Time: 0737-1062 PT Time Calculation (min) (ACUTE ONLY): 27 min  Charges:  $Therapeutic Activity: 8-22 mins                    G Codes:       Marieli Rudy B. Migdalia Dk PT, DPT Acute Rehabilitation  [phone] Pager [phone]     [place] 06/30/2017, 5:03 PM

## 2017-06-30 NOTE — Progress Notes (Addendum)
PROGRESS NOTE    Steven [name]  LKG:[id number] DOB: 07/19/1927 DOA: 06/26/2017 PCP: Orpah Melter, MD   Brief Narrative:83 y.o.malewith medical history significanthypertension, hyperlipidemia, end-stage renal disease on hemodialysis Monday Wednesdays and Fridays, atrial fibrillation on Coumadin, who presented to the ED with a complaint of weakness for about a month and falls. Patient has a Foley in place because he continues to make some urine. He also has a history of hypertensive heart disease and takes Lasix. 11/30  the patient got up from his couch to go to the bathroom and he fell on the floor. He has chronic lower extremity edema and states that his previous dialysis session was normal.  ED Course:Patient was found to be unable to stand or ambulate. It was noted that he has a nurse who comes to his house once a month change of catheter and he has 2 family friends who live nearby and are elderly who assist him with some tasks but are unable to care for him long-term. He was found to be Hypotensive, and in atrial fibrillation with rapid response and IV Cardizem was given without much response. His EKG remained with an atrial fibrillation at a heart rate of 125.   Assessment & Plan:   Principal Problem:   Atrial fibrillation with rapid ventricular response (HCC) -his blood pressure is his main limiting factor which interferes with volume removal on hemodialysis and ability to take his rate controlling medicines - He is on Cardizem and Toprol which he takes on nondialysis days at home -Continue home dose of amiodarone -continue Cardizem, toprol held due to low BPs -continue midodrine,  was on 10 mg twice a day at home now on 10 mg TID which is maximal dose -history has failed multiple rounds of DC cardioversion  Chronic hypotension -this has been a long-standing problem -continue midodrine, increased dose to 10mg  TID -his ejection fraction was 55% based on echocardiogram in  01/2016  ESRD on HD MWF -clinically volume overloaded however has hypotension which limits volume removal -Renal following -called and discussed with nephrology unable to be dialyzed optimally due to hypotension, options significantly limited, I requested palliative consult for goals of care -I have talked to the patient about the fact that he may not be able to continue hemodialysis much longer -await Palliative care evaluation   Obesity (BMI 30-39.9)   Severe deconditioning/debility -Due to advanced age/ESRD in all above comorbidities -plan for SNF fof rehab when stable, think he will benefit from Palliative eval  History of prostate cancer/ and urinary retention -has Chronic Foley, still makes some urine and takes Lasix -no symptoms of UTI at this time, follow-up with urology  DVT prophylaxis: coumadin Code Status: dnr, may not tolerate HD much longer unfortunately Family Communication: no family Disposition Plan: to be determined  Subjective: -Continues to feel lousy and weak, once to go to rehabilitation, seems to somewhat understand limitations however does not acknowledge it at all  Vitals:   06/30/17 0419 06/30/17 0558 06/30/17 0955 06/30/17 1318  BP:  (!) 100/48 (!) 90/45 (!) 105/48  Pulse:  (!) 105    Resp:  19    Temp:  98.3 F (36.8 C)    TempSrc:  Oral    SpO2:  98%    Weight: 93 kg (205 lb 0.4 oz)     Height:        Intake/Output Summary (Last 24 hours) at 06/30/2017 1347 Last data filed at 06/30/2017 0630 Gross per 24 hour  Intake 360  ml  Output 735 ml  Net -375 ml   Filed Weights   06/29/17 1045 06/29/17 1500 06/30/17 0419  Weight: 94 kg (207 lb 3.7 oz) 93.2 kg (205 lb 7.5 oz) 93 kg (205 lb 0.4 oz)    Examination: Gen: Chronically ill obese male, uncomfortable appearing, AAO 3 HEENT: PERRLA, Neck supple, no JVD Lungs: Decreased breath sounds at the bases CVS: RRR,No Gallops,Rubs or new Murmurs Abd: soft, Non tender, non distended, BS  present Extremities: 2-3+ edema at the ankles Skin: no new rashes   Data Reviewed: I have personally reviewed following labs and imaging studies  CBC: Recent Labs  Lab 06/26/17 1057 06/27/17 0300 06/29/17 0902  WBC 8.3 8.4 6.4  HGB 12.7* 11.1* 11.8*  HCT 40.2 34.7* 37.6*  MCV 96.4 95.1 96.4  PLT 161 138* 010*   Basic Metabolic Panel: Recent Labs  Lab 06/26/17 1057 06/27/17 0300 06/29/17 0902  NA 133* 133* 134*  K 4.7 4.8 4.3  CL 92* 96* 95*  CO2 24 24 26   GLUCOSE 99 97 91  BUN 26* 32* 34*  CREATININE 7.46* 8.25* 7.29*  CALCIUM 8.5* 8.1* 8.4*  PHOS  --   --  2.7   GFR: Estimated Creatinine Clearance: 7.7 mL/min (A) (by C-G formula based on SCr of 7.29 mg/dL (H)). Liver Function Tests: Recent Labs  Lab 06/26/17 1057 06/29/17 0902  AST 25  --   ALT 9*  --   ALKPHOS 498*  --   BILITOT 0.7  --   PROT 6.9  --   ALBUMIN 3.1* 2.7*   No results for input(s): LIPASE, AMYLASE in the last 168 hours. No results for input(s): AMMONIA in the last 168 hours. Coagulation Profile: Recent Labs  Lab 06/26/17 1057 06/27/17 0300 06/29/17 0319 06/30/17 0559  INR 2.22 2.24 2.63 2.24   Cardiac Enzymes: No results for input(s): CKTOTAL, CKMB, CKMBINDEX, TROPONINI in the last 168 hours. BNP (last 3 results) No results for input(s): PROBNP in the last 8760 hours. HbA1C: No results for input(s): HGBA1C in the last 72 hours. CBG: Recent Labs  Lab 06/26/17 1102  GLUCAP 100*   Lipid Profile: No results for input(s): CHOL, HDL, LDLCALC, TRIG, CHOLHDL, LDLDIRECT in the last 72 hours. Thyroid Function Tests: No results for input(s): TSH, T4TOTAL, FREET4, T3FREE, THYROIDAB in the last 72 hours. Anemia Panel: No results for input(s): VITAMINB12, FOLATE, FERRITIN, TIBC, IRON, RETICCTPCT in the last 72 hours. Sepsis Labs: No results for input(s): PROCALCITON, LATICACIDVEN in the last 168 hours.  Recent Results (from the past 240 hour(s))  MRSA PCR Screening     Status: None    Collection Time: 06/26/17  9:23 PM  Result Value Ref Range Status   MRSA by PCR NEGATIVE NEGATIVE Final    Comment:        The GeneXpert MRSA Assay (FDA approved for NASAL specimens only), is one component of a comprehensive MRSA colonization surveillance program. It is not intended to diagnose MRSA infection nor to guide or monitor treatment for MRSA infections.          Radiology Studies: No results found.      Scheduled Meds: . allopurinol  100 mg Oral Daily  . amiodarone  200 mg Oral Daily  . atorvastatin  10 mg Oral QPC supper  . diltiazem  240 mg Oral Daily  . doxercalciferol  3 mcg Intravenous Q M,W,F-HD  . feeding supplement (NEPRO CARB STEADY)  237 mL Oral BID BM  . levothyroxine  50  mcg Oral QAC breakfast  . mouth rinse  15 mL Mouth Rinse BID  . medroxyPROGESTERone  5 mg Oral BID  . midodrine  10 mg Oral TID  . mirtazapine  30 mg Oral QPC supper  . multivitamin  1 tablet Oral Daily  . sevelamer carbonate  800 mg Oral QPC supper  . sodium chloride flush  3 mL Intravenous Q12H  . vitamin B-12  1,000 mcg Oral QPC supper  . warfarin  2 mg Oral QPM  . Warfarin - Physician Dosing Inpatient   Does not apply q1800   Continuous Infusions:    LOS: 2 days    Domenic Polite, MD [facility]  If 7PM-7AM, please contact night-coverage [email or web address] Password [email or web address] 06/30/2017, 1:47 PM

## 2017-06-30 NOTE — Telephone Encounter (Signed)
Family called to let Dr Alen Blew know pt is at [place] so will not be making Thursday's appt.

## 2017-06-30 NOTE — Evaluation (Addendum)
Occupational Therapy Evaluation Patient Details Name: Steven [name] MRN: [id number] DOB: 21-Jun-1927 Today's Date: 06/30/2017    History of Present Illness Pt is an 83 y/o male admitted secondary to weakness and falls at home. Upon arrival, pt found to be in a-fib with RVR. PMH including but not limited to ESRD, HTN and HLD.   Clinical Impression   This 83 y/o M presents with the above. Pt lives alone, at baseline is mod independent with functional mobility, reports he receives assistance from an aide for ADL needs though unclear as to the level of assist provided. Pt completed sit<>stand at [facility] this session with ModA +2, requires setup assist for seated ADLs, MaxA +2 for LB ADLs, presenting with generalized weakness and decreased activity tolerance; mobility limited this session due to low BP, though slightly increased with activity. Pt will benefit from continued acute OT services and recommend SNF level OT services after discharge to maximize Pt's safety and independence with ADLs and mobility.     Follow Up Recommendations  SNF;Supervision/Assistance - 24 hour    Equipment Recommendations  Other (comment)(defer to next venue )           Precautions / Restrictions Precautions Precautions: Fall Restrictions Weight Bearing Restrictions: No      Mobility Bed Mobility               General bed mobility comments: OOB in recliner upon arrival   Transfers Overall transfer level: Needs assistance Equipment used: 2 person hand held assist Transfers: Sit to/from Stand Sit to Stand: Mod assist;+2 physical assistance         General transfer comment: Pt completed sit<>stand x2 during session at [facility] with ModA +2, assist to rise and steady in standing, pt unable to tolerate increased pressure on knees in [place] and was returned to recliner     Balance Overall balance assessment: Needs assistance;History of Falls Sitting-balance support: Feet supported Sitting  balance-Leahy Scale: Fair     Standing balance support: During functional activity;Bilateral upper extremity supported Standing balance-Leahy Scale: Poor Standing balance comment: reliant on bilateral UE supports and mod-max A x2                           ADL either performed or assessed with clinical judgement   ADL Overall ADL's : Needs assistance/impaired Eating/Feeding: Set up;Sitting   Grooming: Wash/dry face;Oral care;Brushing hair;Set up;Sitting Grooming Details (indicate cue type and reason): sitting in recliner  Upper Body Bathing: Min guard;Sitting   Lower Body Bathing: Moderate assistance;+2 for safety/equipment;+2 for physical assistance;Sit to/from stand   Upper Body Dressing : Minimal assistance;Sitting   Lower Body Dressing: Maximal assistance;+2 for physical assistance;+2 for safety/equipment;Sit to/from stand   Toilet Transfer: Maximal assistance;+2 for physical assistance;+2 for safety/equipment;Stand-pivot;BSC Toilet Transfer Details (indicate cue type and reason): completed sit<>stand at [facility] this sesssion  Toileting- Clothing Manipulation and Hygiene: Maximal assistance;Sit to/from stand;+2 for physical assistance;+2 for safety/equipment       Functional mobility during ADLs: Moderate assistance;+2 for physical assistance;+2 for safety/equipment General ADL Comments: Pt completed UB/LB exercises, sit<>stand at [facility], seated ADLs, further mobility deferred due to Pt's low BP readings and discomfort with sitting in [place]                          Pertinent Vitals/Pain Pain Assessment: Faces Faces Pain Scale: Hurts little more Pain Location: bil knees, LUE  Pain Descriptors / Indicators:  Sore Pain Intervention(s): Monitored during session;Limited activity within patient's tolerance;Repositioned     Hand Dominance Right   Extremity/Trunk Assessment Upper Extremity Assessment Upper Extremity Assessment: Generalized weakness;LUE  deficits/detail LUE Deficits / Details: limited AROM due to pain/fistula placement, shoulder flexion grossly 2-/5   Lower Extremity Assessment Lower Extremity Assessment: Defer to PT evaluation       Communication Communication Communication: HOH   Cognition Arousal/Alertness: Awake/alert Behavior During Therapy: WFL for tasks assessed/performed Overall Cognitive Status: Impaired/Different from baseline                       Memory: Decreased short-term memory Following Commands: Follows one step commands with increased time Safety/Judgement: Decreased awareness of safety;Decreased awareness of deficits   Problem Solving: Slow processing;Decreased initiation;Difficulty sequencing;Requires verbal cues;Requires tactile cues      Nursing notified therapy of low BP at rest in seated 74/50, after seated exercise 78/61, in standing 83/54     Exercises: General Upper Extremity;General Lower Extremity General Exercises - Upper Extremity Shoulder Flexion: AROM;Both;10 reps Elbow Flexion: AROM;Both;10 reps;Seated Elbow Extension: AROM;Both;10 reps;Seated General Exercises - Lower Extremity Long Arc Quad: AROM;Both;10 reps;Seated Hip Flexion/Marching: AROM;Both;10 reps;Seated         Home Living Family/patient expects to be discharged to:: Skilled nursing facility Living Arrangements: Alone                                      Prior Functioning/Environment Level of Independence: Needs assistance  Gait / Transfers Assistance Needed: ambulates short distances with RW, uses w/c on dialysis days ADL's / Homemaking Assistance Needed: requires assistance from caregiver (unsure of how much, pt very vague and very HOH)            OT Problem List: Decreased strength;Impaired balance (sitting and/or standing);Decreased range of motion;Decreased activity tolerance;Decreased knowledge of use of DME or AE      OT Treatment/Interventions: Self-care/ADL  training;DME and/or AE instruction;Therapeutic activities;Balance training;Therapeutic exercise;Patient/family education;Energy conservation    OT Goals(Current goals can be found in the care plan section) Acute Rehab OT Goals Patient Stated Goal: to get stronger OT Goal Formulation: With patient Time For Goal Achievement: 07/14/17 Potential to Achieve Goals: Good  OT Frequency: Min 2X/week               Co-evaluation PT/OT/SLP Co-Evaluation/Treatment: Yes Reason for Co-Treatment: Complexity of the patient's impairments (multi-system involvement);For patient/therapist safety;To address functional/ADL transfers PT goals addressed during session: Mobility/safety with mobility OT goals addressed during session: ADL's and self-care      AM-PAC PT "6 Clicks" Daily Activity     Outcome Measure Help from another person eating meals?: None Help from another person taking care of personal grooming?: A Little Help from another person toileting, which includes using toliet, bedpan, or urinal?: A Lot Help from another person bathing (including washing, rinsing, drying)?: A Lot Help from another person to put on and taking off regular upper body clothing?: A Little Help from another person to put on and taking off regular lower body clothing?: A Lot 6 Click Score: 16   End of Session Equipment Utilized During Treatment: Gait belt;Other (comment)([place]) Nurse Communication: Mobility status  Activity Tolerance: Patient tolerated treatment well Patient left: in chair;with call bell/phone within reach;with chair alarm set  OT Visit Diagnosis: Unsteadiness on feet (R26.81);Muscle weakness (generalized) (M62.81);History of falling (Z91.81)  Time: 1314-3888 OT Time Calculation (min): 31 min Charges:  OT General Charges $OT Visit: 1 Visit OT Evaluation $OT Eval Moderate Complexity: 1 Mod G-Codes:     Lou Cal, OT Pager [phone] 06/30/2017   Raymondo Band 06/30/2017, 5:13 PM

## 2017-06-30 NOTE — Progress Notes (Signed)
  [facility] Progress Note   Assessment/ Plan:    Dialysis Orders: [facility] MWF EDW 197 lb 2 K 2.5 Ca Qb 350 DFR 500 3.5 hours hectorol 3 Mircera 50 given 11/28 parsabiv 2.5 no heparin left upper AVF  1. Afib with RVR-afib on tele rate variable low 100s; coumadin therapeutic/on diltiazem and amiodarone, no BB due to hypotension 2. ESRD- MWF - missed Friday -Had HD Saturday, now back on MWF schedule 3. BP/volume- on midodrine for BP support and we have uptitrated to max dose 10 TID 4. Anemia- no recent labs avail prior to admission; hgb 11.1 Received Mircera 50 11/28 5. Metabolic bone disease- Continue Hectorol/binders - parsabiv not avail at [facility] 6. Nutrition- alb 3.1 - addednepro - passed swallowing eval  7. Hx prostate cancer -  8. EOL - poor prognosis, DNR.  Discussed with primary team.  Palliative c/s, appreciate assistance.     Subjective:    Sleeping, no complaints.  Says he has met with the palliative care team and knows he "doesn't have much time left."  Only got 800 mL UF yesterday.   Objective:   BP (!) 89/44 (BP Location: Right Arm)   Pulse (!) 106   Temp 97.9 F (36.6 C) (Oral)   Resp 19   Ht '5\' 8"'$  (1.727 m)   Wt 93 kg (205 lb 0.4 oz)   SpO2 97%   BMI 31.17 kg/m   Physical Exam: General: frail elderly man Heart: irreg irreg low 100s Lungs: no rales Abdomen: soft NT Extremities: 2+ LE edema, R > L Dialysis Access: left upper AVF + soft bruit    Labs: BMET Recent Labs  Lab 06/26/17 1057 06/27/17 0300 06/29/17 0902  NA 133* 133* 134*  K 4.7 4.8 4.3  CL 92* 96* 95*  CO2 '24 24 26  '$ GLUCOSE 99 97 91  BUN 26* 32* 34*  CREATININE 7.46* 8.25* 7.29*  CALCIUM 8.5* 8.1* 8.4*  PHOS  --   --  2.7   CBC Recent Labs  Lab 06/26/17 1057 06/27/17 0300 06/29/17 0902  WBC 8.3 8.4 6.4  HGB 12.7* 11.1* 11.8*  HCT 40.2 34.7* 37.6*  MCV 96.4 95.1 96.4  PLT 161 138* 134*    '@IMGRELPRIORS'$ @ Medications:    . allopurinol  100 mg  Oral Daily  . amiodarone  200 mg Oral Daily  . atorvastatin  10 mg Oral QPC supper  . diltiazem  240 mg Oral Daily  . doxercalciferol  3 mcg Intravenous Q M,W,F-HD  . feeding supplement (NEPRO CARB STEADY)  237 mL Oral BID BM  . levothyroxine  50 mcg Oral QAC breakfast  . mouth rinse  15 mL Mouth Rinse BID  . medroxyPROGESTERone  5 mg Oral BID  . midodrine  10 mg Oral TID  . mirtazapine  30 mg Oral QPC supper  . multivitamin  1 tablet Oral Daily  . sevelamer carbonate  800 mg Oral QPC supper  . sodium chloride flush  3 mL Intravenous Q12H  . vitamin B-12  1,000 mcg Oral QPC supper  . warfarin  2 mg Oral QPM  . Warfarin - Physician Dosing Inpatient   Does not apply [place], MD [place] pgr [phone] 06/30/2017, 3:03 PM

## 2017-06-30 NOTE — Consult Note (Signed)
Consultation Note Date: 06/30/2017   Patient Name: Steven [name]  DOB: 12/08/1926  MRN: [id number]  Age / Sex: 83 y.o., male  PCP: Orpah Melter, MD Referring Physician: Domenic Polite, MD  Reason for Consultation: Establishing goals of care  HPI/Patient Profile: 83 y.o. male  with past medical history of ESRD on HD MWF, HTN, HLD, obesity, afib on warfarin, prostate CA, chronic hypotension, hypertensive heart disease on lasix, and chronic foley admitted on 06/26/2017 with weakness x1 month. Fell at home on day of admission and noted hematuria in catheter bag. He was brought to ER by EMS and requesting SNF placement.   He has 2 family friends that provide care for him at home - 12 hours of care 7 days a week. He does not have family other than a brother that he is not in contact with.  He has a home health nurse through [facility] that sees him monthly to change his foley catheter. His hospital course has been complicated by hypotension - he is now on max dose of midodrine, but blood pressures are still limiting volume removal during HD. Echocardiogram done in 10/21/15 showed EF of 55%.  Clinical Assessment and Goals of Care: Met with patient and friend/caregiver, BJ, at bedside. We discussed hospital course and complications with HD. Discussed concern that hypotension is limiting HD and that he may be at a point where he cannot tolerate HD anymore. He tells me "If I'm dying, I want to know." His hope is to go to rehab and regain enough strength to be able to return home; however, he understands this may not be realistic.   We discussed that if he were not able to tolerate HD anymore, his life expectancy would be short. He is aware of this. We discussed option of Hospice care if he could not tolerate HD - including hospice facility and hospice at home. He desires to see how tomorrow goes - to see if he can tolerate HD. If so, proceed with rehab placement  knowing that at some time he will reach a point where he cannot tolerate HD. However, if he cannot tolerate dialysis, he is interested in hospice referral. He tells me he is not afraid of the end of life and shares how his spiritual beliefs impact his end of life views.   He shared about his life - career as a semi Holiday representative. His wife passed away in 10-21-11 and his son passed away in 2008-10-20 with pancreatic cancer. He finds joy in his relationships with his friends that care for him - tells me BJ is his best friend and very grateful for her. Also has a pet cat that brings him joy.   All questions and concerns addressed. Emotional support provided.   Primary Decision Maker PATIENT   SUMMARY OF RECOMMENDATIONS   -DNR - Rehab placement if able to continue HD - please order palliative follow up at discharge if goes to rehab -If unable to continue HD, interested in hospice referral  Code Status/Advance Care Planning:  DNR  Symptom Management:   NA  Palliative Prophylaxis:   Aspiration and Frequent Pain Assessment  Additional Recommendations (Limitations, Scope, Preferences):  DNR, continue HD as pt tolerates  Psycho-social/Spiritual:   Desire for further Chaplaincy support:no  Additional Recommendations: Education on Hospice  Prognosis:   Unable to determine  Discharge Planning: To Be Determined      Primary Diagnoses: Present on Admission: . Dehydration . Obesity (BMI 30-39.9) . Hypertensive heart disease . Hyperlipidemia .  Atrial fibrillation with rapid ventricular response ([place])   I have reviewed the medical record, interviewed the patient and family, and examined the patient. The following aspects are pertinent.  Past Medical History:  Diagnosis Date  . Anemia   . Atrial fibrillation ([place])   . BPH (benign prostatic hyperplasia)   . CKD (chronic kidney disease), stage IV ([place])   . Gout   . HX: long term anticoagulant use   . Hypercalcemia   . Hypertension    . Kidney stone   . Mitral valve disorders(424.0)   . Osteoarthritis   . Prostate CA ([place])   . PVC (premature ventricular contraction)   . Vitamin D deficiency    Social History   Socioeconomic History  . Marital status: Widowed    Spouse name: None  . Number of children: None  . Years of education: None  . Highest education level: None  Social Needs  . Financial resource strain: None  . Food insecurity - worry: None  . Food insecurity - inability: None  . Transportation needs - medical: None  . Transportation needs - non-medical: None  Occupational History  . None  Tobacco Use  . Smoking status: Passive Smoke Exposure - Never Smoker  . Smokeless tobacco: Former Network engineer and Sexual Activity  . Alcohol use: Yes    Comment: attended [employer]  . Drug use: No  . Sexual activity: No  Other Topics Concern  . 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  States no family nearby   History reviewed. No pertinent family history. Scheduled Meds: . allopurinol  100 mg Oral Daily  . amiodarone  200 mg Oral Daily  . atorvastatin  10 mg Oral QPC supper  . diltiazem  240 mg Oral Daily  . doxercalciferol  3 mcg Intravenous Q M,W,F-HD  . feeding supplement (NEPRO CARB STEADY)  237 mL Oral BID BM  . levothyroxine  50 mcg Oral QAC breakfast  . mouth rinse  15 mL Mouth Rinse BID  . medroxyPROGESTERone  5 mg Oral BID  . midodrine  10 mg Oral TID  . mirtazapine  30 mg Oral QPC supper  . multivitamin  1 tablet Oral Daily  . sevelamer carbonate  800 mg Oral QPC supper  . sodium chloride flush  3 mL Intravenous Q12H  . vitamin B-12  1,000 mcg Oral QPC supper  . warfarin  2 mg Oral QPM  . Warfarin - Physician Dosing Inpatient   Does not apply q1800   Continuous Infusions: PRN Meds:.acetaminophen, levalbuterol, ondansetron **OR** ondansetron (ZOFRAN) IV, polyethylene glycol Allergies  Allergen Reactions  . Hydrocodone Other (See  Comments)    Reaction:  Confusion   . Methocarbamol Other (See Comments)    Reported by [facility] 03/06/17 - unknown reaction  . Penicillins Hives and Other (See Comments)    Has patient had a PCN reaction causing immediate rash, facial/tongue/throat swelling, SOB or lightheadedness with hypotension: No Has patient had a PCN reaction causing severe rash involving mucus membranes or skin necrosis: No Has patient had a PCN reaction that required hospitalization No Has patient had a PCN reaction occurring within the last 10 years: No If all of the above answers are "NO", then may proceed with Cephalosporin use.   Review of Systems  Constitutional: Positive for activity change and fatigue.  Respiratory: Positive for cough. Negative for shortness of breath.   Cardiovascular: Positive for leg swelling.  Genitourinary: Positive for decreased urine volume.  Musculoskeletal: Positive for gait problem.  Skin: Positive for wound.  Neurological: Positive for weakness.    Physical Exam  Constitutional: He is oriented to person, place, and time. No distress.  HENT:  Head: Normocephalic and atraumatic.  Cardiovascular: An irregularly irregular rhythm present.  Pulses:      Radial pulses are 1+ on the right side, and 1+ on the left side.  Pulmonary/Chest: Effort normal and breath sounds normal. No accessory muscle usage. No tachypnea. No respiratory distress.  Abdominal: Soft. Bowel sounds are normal. He exhibits no distension. There is no tenderness.  Genitourinary:  Genitourinary Comments: Foley catheter in place, no urine in bag on assessment  Musculoskeletal:       Left forearm: He exhibits edema.       Right lower leg: He exhibits edema.       Left lower leg: He exhibits edema.  Neurological: He is alert and oriented to person, place, and time.  Skin: Skin is warm and dry. He is not diaphoretic.  Psychiatric: He has a normal mood and affect. His behavior is normal. Judgment and  thought content normal.    Vital Signs: BP (!) 100/48 (BP Location: Right Arm)   Pulse (!) 105   Temp 98.3 F (36.8 C) (Oral)   Resp 19   Ht 5' 8" (1.727 m)   Wt 93 kg (205 lb 0.4 oz)   SpO2 98%   BMI 31.17 kg/m  Pain Assessment: No/denies pain   Pain Score: 0-No pain   SpO2: SpO2: 98 % O2 Device:SpO2: 98 % O2 Flow Rate: .O2 Flow Rate (L/min): 2 L/min  IO: Intake/output summary:   Intake/Output Summary (Last 24 hours) at 06/30/2017 0925 Last data filed at 06/30/2017 0630 Gross per 24 hour  Intake 360 ml  Output 735 ml  Net -375 ml    LBM: Last BM Date: 06/30/17 Baseline Weight: Weight: 94.8 kg (209 lb) Most recent weight: Weight: 93 kg (205 lb 0.4 oz)     Palliative Assessment/Data: 40%      Time Total: 70 minutes Greater than 50%  of this time was spent counseling and coordinating care related to the above assessment and plan.  Juel Burrow, DNP, AGNP-C Palliative Medicine Team [phone]

## 2017-07-01 DIAGNOSIS — E039 Hypothyroidism, unspecified: Secondary | ICD-10-CM

## 2017-07-01 DIAGNOSIS — N186 End stage renal disease: Secondary | ICD-10-CM

## 2017-07-01 DIAGNOSIS — I953 Hypotension of hemodialysis: Secondary | ICD-10-CM

## 2017-07-01 DIAGNOSIS — Z992 Dependence on renal dialysis: Secondary | ICD-10-CM

## 2017-07-01 DIAGNOSIS — I4891 Unspecified atrial fibrillation: Principal | ICD-10-CM

## 2017-07-01 LAB — PROTIME-INR
INR: 2.13
PROTHROMBIN TIME: 23.6 s — AB (ref 11.4–15.2)

## 2017-07-01 MED ORDER — MIDODRINE HCL 5 MG PO TABS
ORAL_TABLET | ORAL | Status: AC
  Administered 2017-07-01: 10 mg via ORAL
  Filled 2017-07-01: qty 10

## 2017-07-01 MED ORDER — DOXERCALCIFEROL 4 MCG/2ML IV SOLN
INTRAVENOUS | Status: AC
Start: 2017-07-01 — End: 2017-07-01
  Administered 2017-07-01: 3 ug via INTRAVENOUS
  Filled 2017-07-01: qty 2

## 2017-07-01 NOTE — Progress Notes (Signed)
PROGRESS NOTE    Steven [name]  NID:[id number] DOB: 27-Aug-1926 DOA: 06/26/2017 PCP: Orpah Melter, MD    Brief Narrative:  83 year old male who presented with weakness and ambulatory dysfunction. Patient is known to have hypertension, dyslipidemia, end-stage renal disease on hemodialysis (Mondays, Wednesdays and Fridays), atrial fibrillation and chronic indwelling Foley catheter. Apparently he fell right after standing, patient had generalized weakness, no loss of consciousness. On his initial physical examination blood pressure 94/57, heart rate 125, oxygen saturation 88%, moist mucous membranes, lungs clear to auscultation bilaterally, heart S1-S2, present rhythmic, abdomen soft nontender, 4+ bilateral lower extremity edema. EKG was atrial fibrillation, rate of 125 bpm, right axis deviation, right bundle branch block.   Patient was admitted to the hospital working diagnosis of atrial fibrillation with rapid ventricular response.  Assessment & Plan:   Principal Problem:   Atrial fibrillation with rapid ventricular response (HCC) Active Problems:   Obesity (BMI 30-39.9)   Hypertensive heart disease   Hyperlipidemia   End-stage renal disease on hemodialysis (HCC)   Generalized weakness   Dehydration   Chronic hypotension   Goals of care, counseling/discussion   Palliative care by specialist  1. Atrial fibrillation. Heart rate 104 to 106, will continue rate control with amiodarone and diltiazem, continue telemetry monitoring and anticoagulation with warfarin, INR at 2.13 with target goal 2 to 3.   2. Hypotension. TEE on May 2018, with normal LV systolic function, no signs of RV failure. Will continue with midodrine TID. Questionable autonomic dysfunction. Palliative care has been consulted.   3. ESRD on HD. Will continue renal replacement therapy per nephrology recommendations, caution with hypotension. Continue doxecalciferol on HD. Continue renvela.    4. Hypothyroid. Will  continue levothyroxine per home regimen.   5. Depression. Continue mirtazapine.    DVT prophylaxis: warfarin  Code Status: dnr Family Communication: no family at the bedside Disposition Plan:    Consultants:   Nephrology   Palliative care  Procedures:     Antimicrobials:       Subjective: Patient in HD, feeling weak, but no dyspnea or chest pain, no nausea or vomiting. Blood pressure has been low.   Objective: Vitals:   06/30/17 1411 06/30/17 2012 07/01/17 0139 07/01/17 0624  BP: (!) 89/44 (!) 94/45 (!) 97/48 (!) 90/50  Pulse: (!) 106 98  (!) 102  Resp: 19 20  20   Temp: 97.9 F (36.6 C) 98.3 F (36.8 C)  98.5 F (36.9 C)  TempSrc: Oral Oral  Oral  SpO2: 97% 96%  100%  Weight:      Height:        Intake/Output Summary (Last 24 hours) at 07/01/2017 1219 Last data filed at 06/30/2017 2132 Gross per 24 hour  Intake 240 ml  Output -  Net 240 ml   Filed Weights   06/29/17 1045 06/29/17 1500 06/30/17 0419  Weight: 94 kg (207 lb 3.7 oz) 93.2 kg (205 lb 7.5 oz) 93 kg (205 lb 0.4 oz)    Examination:   General: Not in pain or dyspnea, deconditioned Neurology: Awake and alert, non focal  E ENT: positive pallor, no icterus, oral mucosa moist Cardiovascular: No JVD. S1-S2 present, rhythmic, no gallops, rubs, or murmurs. ++++ pitting lower extremity edema. Pulmonary: decreased breath sounds bilaterally at bases, adequate air movement at the apical zones, no wheezing, rhonchi or rales. Gastrointestinal. Abdomen protuberant, no organomegaly, non tender, no rebound or guarding Skin. No rashes Musculoskeletal: no joint deformities     Data Reviewed: I have personally  reviewed following labs and imaging studies  CBC: Recent Labs  Lab 06/26/17 1057 06/27/17 0300 06/29/17 0902  WBC 8.3 8.4 6.4  HGB 12.7* 11.1* 11.8*  HCT 40.2 34.7* 37.6*  MCV 96.4 95.1 96.4  PLT 161 138* 194*   Basic Metabolic Panel: Recent Labs  Lab 06/26/17 1057 06/27/17 0300  06/29/17 0902  NA 133* 133* 134*  K 4.7 4.8 4.3  CL 92* 96* 95*  CO2 24 24 26   GLUCOSE 99 97 91  BUN 26* 32* 34*  CREATININE 7.46* 8.25* 7.29*  CALCIUM 8.5* 8.1* 8.4*  PHOS  --   --  2.7   GFR: Estimated Creatinine Clearance: 7.7 mL/min (A) (by C-G formula based on SCr of 7.29 mg/dL (H)). Liver Function Tests: Recent Labs  Lab 06/26/17 1057 06/29/17 0902  AST 25  --   ALT 9*  --   ALKPHOS 498*  --   BILITOT 0.7  --   PROT 6.9  --   ALBUMIN 3.1* 2.7*   No results for input(s): LIPASE, AMYLASE in the last 168 hours. No results for input(s): AMMONIA in the last 168 hours. Coagulation Profile: Recent Labs  Lab 06/26/17 1057 06/27/17 0300 06/29/17 0319 06/30/17 0559 07/01/17 0535  INR 2.22 2.24 2.63 2.24 2.13   Cardiac Enzymes: No results for input(s): CKTOTAL, CKMB, CKMBINDEX, TROPONINI in the last 168 hours. BNP (last 3 results) No results for input(s): PROBNP in the last 8760 hours. HbA1C: No results for input(s): HGBA1C in the last 72 hours. CBG: Recent Labs  Lab 06/26/17 1102  GLUCAP 100*   Lipid Profile: No results for input(s): CHOL, HDL, LDLCALC, TRIG, CHOLHDL, LDLDIRECT in the last 72 hours. Thyroid Function Tests: No results for input(s): TSH, T4TOTAL, FREET4, T3FREE, THYROIDAB in the last 72 hours. Anemia Panel: No results for input(s): VITAMINB12, FOLATE, FERRITIN, TIBC, IRON, RETICCTPCT in the last 72 hours.    Radiology Studies: I have reviewed all of the imaging during this hospital visit personally     Scheduled Meds: . allopurinol  100 mg Oral Daily  . amiodarone  200 mg Oral Daily  . atorvastatin  10 mg Oral QPC supper  . diltiazem  240 mg Oral Daily  . doxercalciferol  3 mcg Intravenous Q M,W,F-HD  . feeding supplement (NEPRO CARB STEADY)  237 mL Oral BID BM  . levothyroxine  50 mcg Oral QAC breakfast  . mouth rinse  15 mL Mouth Rinse BID  . medroxyPROGESTERone  5 mg Oral BID  . midodrine  10 mg Oral TID  . mirtazapine  30 mg  Oral QPC supper  . multivitamin  1 tablet Oral Daily  . sevelamer carbonate  800 mg Oral QPC supper  . sodium chloride flush  3 mL Intravenous Q12H  . vitamin B-12  1,000 mcg Oral QPC supper  . warfarin  2 mg Oral QPM  . Warfarin - Physician Dosing Inpatient   Does not apply q1800   Continuous Infusions:   LOS: 3 days        Payeton Germani [name], MD [facility] Pager [phone]

## 2017-07-01 NOTE — Progress Notes (Signed)
Pt has stage two at the sacrum below the tail bone, pink foam dressing placed

## 2017-07-01 NOTE — Procedures (Signed)
Patient seen and examined on Hemodialysis. QB 400 mL/ min, UF goal 2L but UF off for BP 80s/ 40s.  Unlikely we will get needed vol off.  Clotting with 36 min to go  Treatment adjusted as needed.  Madelon Lips MD [facility] pgr [phone] 3:38 PM

## 2017-07-01 NOTE — Progress Notes (Signed)
  [facility] Progress Note   Assessment/ Plan:    Dialysis Orders: [facility] MWF EDW 197 lb 2 K 2.5 Ca Qb 350 DFR 500 3.5 hours hectorol 3 Mircera 50 given 11/28 parsabiv 2.5 no heparin left upper AVF  1. Afib with RVR-afib on tele rate variable low 100s; coumadin therapeutic/on diltiazem and amiodarone, no BB due to hypotension 2. ESRD- MWF, had to dialyze off schedule, now back on MWF schedule 3. BP/volume- on midodrine for BP support and we have uptitrated to max dose 10 TID 4. Anemia- no recent labs avail prior to admission; hgb 11.1 Received Mircera 50 11/28 5. Metabolic bone disease- Continue Hectorol/binders - parsabiv not avail at [facility] 6. Nutrition- alb 3.1 - addednepro - passed swallowing eval  7. Hx prostate cancer  8. EOL - poor prognosis, DNR.  Discussed with primary team.  Palliative c/s, appreciate assistance.  If unable to dialyze successfully, he is interested in hospice referral.   Subjective:    No complaints.  Legs elevated this AM.  Still hypotensive on max midodrine.   Objective:   BP (!) 90/50 (BP Location: Right Arm)   Pulse (!) 102   Temp 98.5 F (36.9 C) (Oral)   Resp 20   Ht 5\' 8"  (1.727 m)   Wt 93 kg (205 lb 0.4 oz)   SpO2 100%   BMI 31.17 kg/m   Physical Exam: General: frail elderly man Heart: irreg irreg low 100s Lungs: no rales Abdomen: soft NT Extremities: 2+ LE edema, R > L Dialysis Access: left upper AVF + soft bruit    Labs: BMET Recent Labs  Lab 06/26/17 1057 06/27/17 0300 06/29/17 0902  NA 133* 133* 134*  K 4.7 4.8 4.3  CL 92* 96* 95*  CO2 24 24 26   GLUCOSE 99 97 91  BUN 26* 32* 34*  CREATININE 7.46* 8.25* 7.29*  CALCIUM 8.5* 8.1* 8.4*  PHOS  --   --  2.7   CBC Recent Labs  Lab 06/26/17 1057 06/27/17 0300 06/29/17 0902  WBC 8.3 8.4 6.4  HGB 12.7* 11.1* 11.8*  HCT 40.2 34.7* 37.6*  MCV 96.4 95.1 96.4  PLT 161 138* 134*    @IMGRELPRIORS @ Medications:    . allopurinol  100 mg Oral  Daily  . amiodarone  200 mg Oral Daily  . atorvastatin  10 mg Oral QPC supper  . diltiazem  240 mg Oral Daily  . doxercalciferol  3 mcg Intravenous Q M,W,F-HD  . feeding supplement (NEPRO CARB STEADY)  237 mL Oral BID BM  . levothyroxine  50 mcg Oral QAC breakfast  . mouth rinse  15 mL Mouth Rinse BID  . medroxyPROGESTERone  5 mg Oral BID  . midodrine  10 mg Oral TID  . mirtazapine  30 mg Oral QPC supper  . multivitamin  1 tablet Oral Daily  . sevelamer carbonate  800 mg Oral QPC supper  . sodium chloride flush  3 mL Intravenous Q12H  . vitamin B-12  1,000 mcg Oral QPC supper  . warfarin  2 mg Oral QPM  . Warfarin - Physician Dosing Inpatient   Does not apply [place], MD [facility] pgr [phone] 07/01/2017, 11:56 AM

## 2017-07-02 ENCOUNTER — Ambulatory Visit: Payer: Medicare Other | Admitting: Oncology

## 2017-07-02 ENCOUNTER — Other Ambulatory Visit: Payer: Medicare Other

## 2017-07-02 DIAGNOSIS — E7849 Other hyperlipidemia: Secondary | ICD-10-CM

## 2017-07-02 LAB — PROTIME-INR
INR: 1.95
Prothrombin Time: 22.1 seconds — ABNORMAL HIGH (ref 11.4–15.2)

## 2017-07-02 LAB — BASIC METABOLIC PANEL
Anion gap: 14 (ref 5–15)
BUN: 25 mg/dL — AB (ref 6–20)
CHLORIDE: 95 mmol/L — AB (ref 101–111)
CO2: 26 mmol/L (ref 22–32)
CREATININE: 4.73 mg/dL — AB (ref 0.61–1.24)
Calcium: 8.4 mg/dL — ABNORMAL LOW (ref 8.9–10.3)
GFR calc Af Amer: 12 mL/min — ABNORMAL LOW (ref >60)
GFR calc non Af Amer: 10 mL/min — ABNORMAL LOW (ref >60)
GLUCOSE: 90 mg/dL (ref 65–99)
POTASSIUM: 3.7 mmol/L (ref 3.5–5.1)
Sodium: 135 mmol/L (ref 135–145)

## 2017-07-02 NOTE — Progress Notes (Signed)
Physical Therapy Treatment Patient Details Name: Steven [name] MRN: [id number] DOB: 09-Mar-1927 Today's Date: 07/02/2017    History of Present Illness Pt is an 83 y/o male admitted secondary to weakness and falls at home. Upon arrival, pt found to be in a-fib with RVR. PMH including but not limited to ESRD, HTN and HLD.    PT Comments    Pt is progressing well towards his goals however continues to be limited by back pain and fatigue with mobility. Pt is currently modAx2 for sit<>stand and ambulation of 8 feet with RW. Pt would benefit from daily physical activity as his BP has increased with activity during the last two treatment sessions and the pt reports feeling better after activity. D/c plans remain appropriate. PT will continue to follow acutely.     Follow Up Recommendations  SNF     Equipment Recommendations  None recommended by PT    Recommendations for Other Services       Precautions / Restrictions Precautions Precautions: Fall Restrictions Weight Bearing Restrictions: No    Mobility  Bed Mobility               General bed mobility comments: OOB in recliner upon arrival   Transfers Overall transfer level: Needs assistance Equipment used: 2 person hand held assist Transfers: Sit to/from Stand Sit to Stand: Mod assist;+2 physical assistance         General transfer comment: Pt completed sit<>stand at [facility] with ModA +2, and complained of knee pain even with padding on [place], pt then able to sit to stand from chair with modAx2 for standing at RW on second attempt able to walk 8 feet before fatiguing   Ambulation/Gait Ambulation/Gait assistance: Mod assist;+2 safety/equipment Ambulation Distance (Feet): 8 Feet Assistive device: Rolling walker (2 wheeled) Gait Pattern/deviations: Step-through pattern;Decreased step length - right;Decreased step length - left;Shuffle;Trunk flexed Gait velocity: decreased Gait velocity interpretation: Below normal speed  for age/gender General Gait Details: modA for steadying with close chair follow, vc for upright posture and sequencing       Balance Overall balance assessment: Needs assistance;History of Falls Sitting-balance support: Feet supported Sitting balance-Leahy Scale: Fair     Standing balance support: During functional activity;Bilateral upper extremity supported Standing balance-Leahy Scale: Poor Standing balance comment: reliant on bilateral UE supports and mod-max A x2                            Cognition Arousal/Alertness: Awake/alert Behavior During Therapy: WFL for tasks assessed/performed Overall Cognitive Status: Impaired/Different from baseline                       Memory: Decreased short-term memory Following Commands: Follows multi-step commands with increased time Safety/Judgement: Decreased awareness of deficits   Problem Solving: Requires verbal cues;Requires tactile cues           General Comments General comments (skin integrity, edema, etc.): in sitting BP 83/48, in standing 96/53 and after ambulation BP 99/56      Pertinent Vitals/Pain Pain Assessment: Faces Faces Pain Scale: Hurts little more Pain Location: back and bil knees Pain Descriptors / Indicators: Sore           PT Goals (current goals can now be found in the care plan section) Acute Rehab PT Goals Patient Stated Goal: to get stronger PT Goal Formulation: With patient Time For Goal Achievement: 07/11/17 Potential to Achieve Goals: Fair    Frequency  Min 2X/week      PT Plan Current plan remains appropriate    Co-evaluation PT/OT/SLP Co-Evaluation/Treatment: Yes            AM-PAC PT "6 Clicks" Daily Activity  Outcome Measure  Difficulty turning over in bed (including adjusting bedclothes, sheets and blankets)?: Unable Difficulty moving from lying on back to sitting on the side of the bed? : Unable Difficulty sitting down on and standing up from a  chair with arms (e.g., wheelchair, bedside commode, etc,.)?: Unable Help needed moving to and from a bed to chair (including a wheelchair)?: A Lot Help needed walking in hospital room?: A Lot Help needed climbing 3-5 steps with a railing? : Total 6 Click Score: 8    End of Session Equipment Utilized During Treatment: Gait belt Activity Tolerance: Patient limited by fatigue Patient left: in chair;with call bell/phone within reach;with chair alarm set Nurse Communication: Mobility status;Need for lift equipment PT Visit Diagnosis: Other abnormalities of gait and mobility (R26.89)     Time: 2229-7989 PT Time Calculation (min) (ACUTE ONLY): 26 min  Charges:  $Gait Training: 8-22 mins $Therapeutic Activity: 8-22 mins                    G Codes:       Laikynn Pollio B. Migdalia Dk PT, DPT Acute Rehabilitation  [phone] Pager [phone]     [place] 07/02/2017, 4:52 PM

## 2017-07-02 NOTE — Progress Notes (Signed)
Daily Progress Note   Patient Name: Steven [name]       Date: 07/02/2017 DOB: September 21, 1926  Age: 83 y.o. MRN#: [id number] Attending Physician: Tawni Millers Primary Care Physician: Orpah Melter, MD Admit Date: 06/26/2017  Reason for Consultation/Follow-up: Establishing goals of care  Subjective: Patient sitting in chair, states he feels well. Denies pain.  Length of Stay: 4  Current Medications: Scheduled Meds:  . allopurinol  100 mg Oral Daily  . amiodarone  200 mg Oral Daily  . atorvastatin  10 mg Oral QPC supper  . diltiazem  240 mg Oral Daily  . doxercalciferol  3 mcg Intravenous Q M,W,F-HD  . feeding supplement (NEPRO CARB STEADY)  237 mL Oral BID BM  . levothyroxine  50 mcg Oral QAC breakfast  . mouth rinse  15 mL Mouth Rinse BID  . medroxyPROGESTERone  5 mg Oral BID  . midodrine  10 mg Oral TID  . mirtazapine  30 mg Oral QPC supper  . multivitamin  1 tablet Oral Daily  . sevelamer carbonate  800 mg Oral QPC supper  . sodium chloride flush  3 mL Intravenous Q12H  . vitamin B-12  1,000 mcg Oral QPC supper  . warfarin  2 mg Oral QPM  . Warfarin - Physician Dosing Inpatient   Does not apply q1800    Continuous Infusions:   PRN Meds: acetaminophen, levalbuterol, ondansetron **OR** ondansetron (ZOFRAN) IV, polyethylene glycol  Physical Exam  Constitutional: He is oriented to person, place, and time. He is cooperative. No distress.  HENT:  Head: Normocephalic and atraumatic.  Cardiovascular: An irregularly irregular rhythm present. Tachycardia present.  Pulses:      Radial pulses are 2+ on the right side, and 2+ on the left side.  Unable to palpate pedal pulses d/t edema - pitting edema bilateral lower extremities and left arm  Pulmonary/Chest: Effort normal and  breath sounds normal. No accessory muscle usage. No respiratory distress.  Abdominal: Soft. Bowel sounds are normal. There is no tenderness.  Genitourinary:  Genitourinary Comments: Foley in place, small amt of dark urine in bag  Neurological: He is alert and oriented to person, place, and time.  Skin: Skin is warm and dry. He is not diaphoretic.  Psychiatric: He has a normal mood and affect. His behavior is normal. Thought content normal. Cognition and memory are normal.            Vital Signs: BP (!) 84/42 (BP Location: Right Arm)   Pulse (!) 108   Temp 98.5 F (36.9 C)   Resp (!) 24   Ht '5\' 8"'$  (1.727 m)   Wt 95 kg (209 lb 7 oz)   SpO2 95%   BMI 31.84 kg/m  SpO2: SpO2: 95 % O2 Device: O2 Device: Not Delivered O2 Flow Rate: O2 Flow Rate (L/min): 2 L/min  Intake/output summary:   Intake/Output Summary (Last 24 hours) at 07/02/2017 0820 Last data filed at 07/02/2017 0536 Gross per 24 hour  Intake 340 ml  Output 337 ml  Net 3 ml   LBM: Last BM Date: 07/01/17 Baseline Weight: Weight: 94.8 kg (209 lb) Most recent weight: Weight: 95 kg (209 lb 7 oz)  Palliative Assessment/Data:40%    Flowsheet Rows     Most Recent Value  Intake Tab  Referral Department  Hospitalist  Unit at Time of Referral  Cardiac/Telemetry Unit  Palliative Care Primary Diagnosis  Nephrology  Date Notified  06/29/17  Palliative Care Type  New Palliative care  Reason for referral  Clarify Goals of Care  Date of Admission  06/26/17  Date first seen by Palliative Care  06/30/17  # of days Palliative referral response time  1 Day(s)  # of days IP prior to Palliative referral  3  Clinical Assessment  Palliative Performance Scale Score  40%  Psychosocial & Spiritual Assessment  Palliative Care Outcomes  Patient/Family meeting held?  Yes  Who was at the meeting?  patient and friend/caregiver  Palliative Care Outcomes  Clarified goals of care, Provided psychosocial or spiritual support       Patient Active Problem List   Diagnosis Date Noted  . Chronic hypotension   . Goals of care, counseling/discussion   . Palliative care by specialist   . Dehydration 06/26/2017  . Atrial fibrillation with rapid ventricular response ([place])   . ESRD on dialysis ([place])   . Essential hypertension   . Acute respiratory failure with hypoxia ([place])   . Generalized weakness   . Shortness of breath 12/08/2016  . End-stage renal disease on hemodialysis ([place]) 12/08/2016  . Chronic kidney disease, stage IV (severe) ([place])   . Long term current use of anticoagulant therapy   . Hyperlipidemia   . Prostate cancer ([place])   . Acute blood loss anemia 06/08/2013  . Obesity (BMI 30-39.9)   . Atrial fibrillation ([place])   . Hypertensive heart disease   . Mitral valve disease   . S/P right TKA 06/06/2013    Palliative Care Assessment & Plan   HPI: 83 y.o. male  with past medical history of ESRD on HD MWF, HTN, HLD, obesity, afib on warfarin, prostate CA, chronic hypotension, hypertensive heart disease on lasix, and chronic foley admitted on 06/26/2017 with weakness x1 month. Fell at home on day of admission and noted hematuria in catheter bag. He was brought to ER by EMS and requesting SNF placement.   He has 2 family friends that provide care for him at home - 12 hours of care 7 days/week. He does not have family other than a brother that he is not in contact with.  He has a home health nurse through [facility] that sees him monthly to change his foley catheter. His hospital course has been complicated by hypotension - he is now on max dose of midodrine, but blood pressures are still limiting volume removal during HD. Echocardiogram done in 2017 showed EF of 55%.  Assessment: Met w/ patient and friend/caregiver BJ at bedside to follow up on [place] conversation. Patient understands poor prognosis and understands we may be reaching a point where his body no longer tolerates dialysis; however, states that as  long as dialysis is offered to him he would like to continue it. He tells me he is ready for the end of life when it comes and understands the hospice support that would be available to him when he is no longer able to continue dialysis. He wants to proceed with SNF placement.  All questions and concerns addressed. Emotional support provided.   Recommendations/Plan:  SNF placement and continue HD as long as tolerated  DNR  Please write for palliative care follow up at SNF on discharge  Goals of Care and  Additional Recommendations:  Limitations on Scope of Treatment: Full Scope Treatment, DNR  Code Status:  DNR  Prognosis:   Unable to determine  Discharge Planning:  [place] for rehab with Palliative care service follow-up  Care plan was discussed with patient, caregiver/friend BJ  Thank you for allowing the Palliative Medicine Team to assist in the care of this patient.   Total Time 30 minutes Prolonged Time Billed  no       Greater than 50%  of this time was spent counseling and coordinating care related to the above assessment and plan.  Juel Burrow, DNP, AGNP-C Palliative Medicine Team Team Phone # [phone]

## 2017-07-02 NOTE — Progress Notes (Signed)
[facility] Progress Note   Assessment/ Plan:    Dialysis Orders: [facility] MWF EDW 197 lb 2 K 2.5 Ca Qb 350 DFR 500 3.5 hours hectorol 3 Mircera 50 given 11/28 parsabiv 2.5 no heparin left upper AVF  1. Afib with RVR-afib on tele rate variable low 100s; coumadin therapeutic/on diltiazem and amiodarone, no BB due to hypotension 2. ESRD- MWF, had to dialyze off schedule, now back on MWF schedule.  We are not able to safely UF in dialysis and I discussed this with pt today.  He seems to be interested in stopping dialysis and going home with hospice.  I greatly appreciate the involvement of palliative care to clarify these decisions and to help arrange those services if that is his wish. 3. BP/volume- on midodrine for BP support and we have uptitrated to max dose 10 TID 4. Anemia- no recent labs avail prior to admission; hgb 11.1 Received Mircera 50 11/28 5. Metabolic bone disease- Continue Hectorol/binders - parsabiv not avail at [facility] 6. Nutrition- alb 3.1 - addednepro - passed swallowing eval  7. Hx prostate cancer  8. EOL - poor prognosis, DNR.  Discussed with primary team.  Palliative c/s, appreciate assistance.  If unable to dialyze successfully, he is interested in hospice referral.   Subjective:    Only 337 mL off yesterday in dialysis.  Pt had many questions about EOL this AM.  He seems to be thinking about stopping dialysis because "you can't fix the problem anyway."   Objective:   BP (!) 93/47   Pulse (!) 123   Temp 98.5 F (36.9 C)   Resp (!) 24   Ht 5\' 8"  (1.727 m)   Wt 95 kg (209 lb 7 oz)   SpO2 95%   BMI 31.84 kg/m   Physical Exam: General: frail elderly man Heart: irreg irreg tachy Lungs: no rales Abdomen: soft NT Extremities: 2+ LE edema, R > L, worsened Dialysis Access: left upper AVF + soft bruit    Labs: BMET Recent Labs  Lab 06/26/17 1057 06/27/17 0300 06/29/17 0902 07/02/17 0503  NA 133* 133* 134* 135  K 4.7 4.8 4.3 3.7   CL 92* 96* 95* 95*  CO2 24 24 26 26   GLUCOSE 99 97 91 90  BUN 26* 32* 34* 25*  CREATININE 7.46* 8.25* 7.29* 4.73*  CALCIUM 8.5* 8.1* 8.4* 8.4*  PHOS  --   --  2.7  --    CBC Recent Labs  Lab 06/26/17 1057 06/27/17 0300 06/29/17 0902  WBC 8.3 8.4 6.4  HGB 12.7* 11.1* 11.8*  HCT 40.2 34.7* 37.6*  MCV 96.4 95.1 96.4  PLT 161 138* 134*    @IMGRELPRIORS @ Medications:    . allopurinol  100 mg Oral Daily  . amiodarone  200 mg Oral Daily  . atorvastatin  10 mg Oral QPC supper  . diltiazem  240 mg Oral Daily  . doxercalciferol  3 mcg Intravenous Q M,W,F-HD  . feeding supplement (NEPRO CARB STEADY)  237 mL Oral BID BM  . levothyroxine  50 mcg Oral QAC breakfast  . mouth rinse  15 mL Mouth Rinse BID  . medroxyPROGESTERone  5 mg Oral BID  . midodrine  10 mg Oral TID  . mirtazapine  30 mg Oral QPC supper  . multivitamin  1 tablet Oral Daily  . sevelamer carbonate  800 mg Oral QPC supper  . sodium chloride flush  3 mL Intravenous Q12H  . vitamin B-12  1,000 mcg Oral QPC supper  .  warfarin  2 mg Oral QPM  . Warfarin - Physician Dosing Inpatient   Does not apply [place], MD [facility] pgr [phone] 07/02/2017, 9:52 AM

## 2017-07-02 NOTE — Progress Notes (Signed)
Pt BP 88/51. And recheck 93/47. HR 123. MD notified and questioning cardizem and if ok to give or hold. Per Dr. Cathlean Sauer ok to give

## 2017-07-02 NOTE — Progress Notes (Signed)
PROGRESS NOTE    Steven [name]  JKD:[id number] DOB: 03/22/1927 DOA: 06/26/2017 PCP: Orpah Melter, MD    Brief Narrative:  83 year old male who presented with weakness and ambulatory dysfunction. Patient is known to have hypertension, dyslipidemia, end-stage renal disease on hemodialysis (Mondays, Wednesdays and Fridays), atrial fibrillation and chronic indwelling Foley catheter. Apparently he fell right after standing, patient had generalized weakness, no loss of consciousness. On his initial physical examination blood pressure 94/57, heart rate 125, oxygen saturation 88%, moist mucous membranes, lungs clear to auscultation bilaterally, heart S1-S2, present rhythmic, abdomen soft nontender, 4+ bilateral lower extremity edema. EKG was atrial fibrillation, rate of 125 bpm, right axis deviation, right bundle branch block.   Patient was admitted to the hospital working diagnosis of atrial fibrillation with rapid ventricular response.  Assessment & Plan:   Principal Problem:   Atrial fibrillation with rapid ventricular response (HCC) Active Problems:   Obesity (BMI 30-39.9)   Hypertensive heart disease   Hyperlipidemia   End-stage renal disease on hemodialysis (HCC)   Generalized weakness   Dehydration   Chronic hypotension   Goals of care, counseling/discussion   Palliative care by specialist   1. Atrial fibrillation. Heart well rate continue controlled with amiodarone and diltiazem, anticoagulation with warfarin, INR 1,9, will resume warfarin in am, if continue to drop in the nexy 24 hours.    2. Hypotension. On midodrine TID. Holding all antihypertensive medications.  3. ESRD on HD. Renal replacement therapy per nephrology, on doxecalciferol on HD. Continue renvela. If patient tolerates HD in am well, will plan to discharge in am to SNF.   4. Hypothyroid. On levothyroxine.   5. Depression. On mirtazapine, no confusion or agitation.     DVT prophylaxis: warfarin  Code Status: dnr Family Communication: no family at the bedside Disposition Plan:    Consultants:   Nephrology   Palliative care  Procedures:     Antimicrobials:      Subjective: Patient reports feeling unchanged from yesterday, no worsening dyspnea and persistent lower extremity edema. No chest pain, no nausea or vomiting.   Objective: Vitals:   07/02/17 0100 07/02/17 0542 07/02/17 0908 07/02/17 0913  BP:  (!) 84/42 (!) 88/51 (!) 93/47  Pulse:  (!) 108 (!) 105 (!) 123  Resp:  (!) 24    Temp:  98.5 F (36.9 C)    TempSrc:      SpO2:  95%    Weight: 95 kg (209 lb 7 oz)     Height:        Intake/Output Summary (Last 24 hours) at 07/02/2017 1238 Last data filed at 07/02/2017 0998 Gross per 24 hour  Intake 576 ml  Output 337 ml  Net 239 ml   Filed Weights   06/30/17 0419 07/01/17 1132 07/02/17 0100  Weight: 93 kg (205 lb 0.4 oz) 94 kg (207 lb 3.7 oz) 95 kg (209 lb 7 oz)    Examination:   General: Not in pain or dyspnea, deconditioned Neurology: Awake and alert, non focal  E ENT: mild pallor, no icterus, oral mucosa moist Cardiovascular: No JVD. S1-S2 present, rhythmic, no gallops, rubs, or murmurs. ++++ lower extremity edema bilaterally. Pulmonary: decreased breath sounds bilaterally, adequate air movement, no wheezing, rhonchi or rales. Gastrointestinal. Abdomen flat, no organomegaly, non tender, no rebound or guarding Skin. No rashes Musculoskeletal: no joint deformities     Data Reviewed: I have personally reviewed following labs and imaging studies  CBC: Recent Labs  Lab 06/26/17 1057 06/27/17 0300 06/29/17  0902  WBC 8.3 8.4 6.4  HGB 12.7* 11.1* 11.8*  HCT 40.2 34.7* 37.6*  MCV 96.4 95.1 96.4  PLT 161 138* 761*   Basic Metabolic Panel: Recent Labs  Lab 06/26/17 1057 06/27/17 0300 06/29/17 0902 07/02/17 0503  NA 133* 133* 134* 135  K 4.7 4.8 4.3 3.7  CL 92* 96* 95* 95*  CO2 24 24 26 26   GLUCOSE 99 97 91 90  BUN 26* 32* 34* 25*    CREATININE 7.46* 8.25* 7.29* 4.73*  CALCIUM 8.5* 8.1* 8.4* 8.4*  PHOS  --   --  2.7  --    GFR: Estimated Creatinine Clearance: 12.1 mL/min (A) (by C-G formula based on SCr of 4.73 mg/dL (H)). Liver Function Tests: Recent Labs  Lab 06/26/17 1057 06/29/17 0902  AST 25  --   ALT 9*  --   ALKPHOS 498*  --   BILITOT 0.7  --   PROT 6.9  --   ALBUMIN 3.1* 2.7*   No results for input(s): LIPASE, AMYLASE in the last 168 hours. No results for input(s): AMMONIA in the last 168 hours. Coagulation Profile: Recent Labs  Lab 06/27/17 0300 06/29/17 0319 06/30/17 0559 07/01/17 0535 07/02/17 0503  INR 2.24 2.63 2.24 2.13 1.95   Cardiac Enzymes: No results for input(s): CKTOTAL, CKMB, CKMBINDEX, TROPONINI in the last 168 hours. BNP (last 3 results) No results for input(s): PROBNP in the last 8760 hours. HbA1C: No results for input(s): HGBA1C in the last 72 hours. CBG: Recent Labs  Lab 06/26/17 1102  GLUCAP 100*   Lipid Profile: No results for input(s): CHOL, HDL, LDLCALC, TRIG, CHOLHDL, LDLDIRECT in the last 72 hours. Thyroid Function Tests: No results for input(s): TSH, T4TOTAL, FREET4, T3FREE, THYROIDAB in the last 72 hours. Anemia Panel: No results for input(s): VITAMINB12, FOLATE, FERRITIN, TIBC, IRON, RETICCTPCT in the last 72 hours.    Radiology Studies: I have reviewed all of the imaging during this hospital visit personally     Scheduled Meds: . allopurinol  100 mg Oral Daily  . amiodarone  200 mg Oral Daily  . atorvastatin  10 mg Oral QPC supper  . diltiazem  240 mg Oral Daily  . doxercalciferol  3 mcg Intravenous Q M,W,F-HD  . feeding supplement (NEPRO CARB STEADY)  237 mL Oral BID BM  . levothyroxine  50 mcg Oral QAC breakfast  . mouth rinse  15 mL Mouth Rinse BID  . medroxyPROGESTERone  5 mg Oral BID  . midodrine  10 mg Oral TID  . mirtazapine  30 mg Oral QPC supper  . multivitamin  1 tablet Oral Daily  . sevelamer carbonate  800 mg Oral QPC supper   . sodium chloride flush  3 mL Intravenous Q12H  . vitamin B-12  1,000 mcg Oral QPC supper  . warfarin  2 mg Oral QPM  . Warfarin - Physician Dosing Inpatient   Does not apply q1800   Continuous Infusions:   LOS: 4 days        Steven [name] Steven Apley, MD [facility] Pager [phone]

## 2017-07-03 DIAGNOSIS — E669 Obesity, unspecified: Secondary | ICD-10-CM

## 2017-07-03 LAB — BASIC METABOLIC PANEL
ANION GAP: 12 (ref 5–15)
BUN: 37 mg/dL — AB (ref 6–20)
CALCIUM: 8.4 mg/dL — AB (ref 8.9–10.3)
CO2: 26 mmol/L (ref 22–32)
CREATININE: 5.89 mg/dL — AB (ref 0.61–1.24)
Chloride: 94 mmol/L — ABNORMAL LOW (ref 101–111)
GFR calc Af Amer: 9 mL/min — ABNORMAL LOW (ref >60)
GFR, EST NON AFRICAN AMERICAN: 8 mL/min — AB (ref >60)
GLUCOSE: 106 mg/dL — AB (ref 65–99)
Potassium: 3.8 mmol/L (ref 3.5–5.1)
Sodium: 132 mmol/L — ABNORMAL LOW (ref 135–145)

## 2017-07-03 LAB — CBC
HCT: 34.1 % — ABNORMAL LOW (ref 39.0–52.0)
HEMOGLOBIN: 11.1 g/dL — AB (ref 13.0–17.0)
MCH: 30.2 pg (ref 26.0–34.0)
MCHC: 32.6 g/dL (ref 30.0–36.0)
MCV: 92.9 fL (ref 78.0–100.0)
PLATELETS: 137 10*3/uL — AB (ref 150–400)
RBC: 3.67 MIL/uL — ABNORMAL LOW (ref 4.22–5.81)
RDW: 17.3 % — AB (ref 11.5–15.5)
WBC: 7.9 10*3/uL (ref 4.0–10.5)

## 2017-07-03 LAB — PROTIME-INR
INR: 2.02
Prothrombin Time: 22.7 seconds — ABNORMAL HIGH (ref 11.4–15.2)

## 2017-07-03 MED ORDER — DOXERCALCIFEROL 4 MCG/2ML IV SOLN
INTRAVENOUS | Status: AC
  Administered 2017-07-03: 3 ug via INTRAVENOUS
  Filled 2017-07-03: qty 2

## 2017-07-03 MED ORDER — MIDODRINE HCL 5 MG PO TABS
ORAL_TABLET | ORAL | Status: AC
  Administered 2017-07-03: 10 mg via ORAL
  Filled 2017-07-03: qty 2

## 2017-07-03 NOTE — Care Management Important Message (Signed)
Important Message  Patient Details  Name: Steven [name] MRN: [id number] Date of Birth: 11-05-1926   Medicare Important Message Given:  Yes    Orvill Coulthard [place] 07/03/2017, 10:26 AM

## 2017-07-03 NOTE — Progress Notes (Signed)
  [facility] Progress Note   Assessment/ Plan:    Dialysis Orders: [facility] MWF EDW 197 lb 2 K 2.5 Ca Qb 350 DFR 500 3.5 hours hectorol 3 Mircera 50 given 11/28 parsabiv 2.5 no heparin left upper AVF  1. Afib with RVR-afib on tele rate variable low 100s; coumadin therapeutic/on diltiazem and amiodarone, no BB due to hypotension 2. ESRD- MWF, had to dialyze off schedule, now back on MWF schedule.  HD today.  Really getting minimal UF with max midodrine. 3. BP/volume- on midodrine for BP support and we have uptitrated to max dose 10 TID 4. Anemia- no recent labs avail prior to admission; hgb 11.1 Received Mircera 50 11/28 5. Metabolic bone disease- Continue Hectorol/binders - parsabiv not avail at [facility] 6. Nutrition- alb 3.1 - addednepro - passed swallowing eval  7. Hx prostate cancer  8. EOL - poor prognosis, DNR.  Discussed with primary team.  Palliative c/s, appreciate assistance.  If unable to dialyze successfully, he is interested in hospice referral.   Subjective:    Seen in room.  Has decided he wants to see how long he can continue dialysis and get rehab in [employer]   Objective:   BP (!) 94/48 (BP Location: Right Arm)   Pulse (!) 109   Temp 97.9 F (36.6 C)   Resp 16   Ht 5\' 8"  (1.727 m)   Wt 92.9 kg (204 lb 12.8 oz)   SpO2 98%   BMI 31.14 kg/m   Physical Exam: General: frail elderly man Heart: irreg irreg tachy Lungs: no rales Abdomen: soft NT Extremities: 2+ LE edema, R > L, worsened Dialysis Access: left upper AVF + soft bruit    Labs: BMET Recent Labs  Lab 06/27/17 0300 06/29/17 0902 07/02/17 0503 07/03/17 0246  NA 133* 134* 135 132*  K 4.8 4.3 3.7 3.8  CL 96* 95* 95* 94*  CO2 24 26 26 26   GLUCOSE 97 91 90 106*  BUN 32* 34* 25* 37*  CREATININE 8.25* 7.29* 4.73* 5.89*  CALCIUM 8.1* 8.4* 8.4* 8.4*  PHOS  --  2.7  --   --    CBC Recent Labs  Lab 06/27/17 0300 06/29/17 0902  WBC 8.4 6.4  HGB 11.1* 11.8*  HCT 34.7*  37.6*  MCV 95.1 96.4  PLT 138* 134*    @IMGRELPRIORS @ Medications:    . allopurinol  100 mg Oral Daily  . amiodarone  200 mg Oral Daily  . atorvastatin  10 mg Oral QPC supper  . diltiazem  240 mg Oral Daily  . doxercalciferol  3 mcg Intravenous Q M,W,F-HD  . feeding supplement (NEPRO CARB STEADY)  237 mL Oral BID BM  . levothyroxine  50 mcg Oral QAC breakfast  . mouth rinse  15 mL Mouth Rinse BID  . medroxyPROGESTERone  5 mg Oral BID  . midodrine  10 mg Oral TID  . mirtazapine  30 mg Oral QPC supper  . multivitamin  1 tablet Oral Daily  . sevelamer carbonate  800 mg Oral QPC supper  . sodium chloride flush  3 mL Intravenous Q12H  . vitamin B-12  1,000 mcg Oral QPC supper  . warfarin  2 mg Oral QPM  . Warfarin - Physician Dosing Inpatient   Does not apply [place], MD [facility] pgr [phone] 07/03/2017, 2:47 PM

## 2017-07-03 NOTE — Procedures (Signed)
Patient was seen on dialysis and the procedure was supervised.  BFR 400  Via AVF BP is  94/48.   Patient appears to be tolerating treatment well  Steven [name] A 07/03/2017

## 2017-07-03 NOTE — Progress Notes (Signed)
Exit care note given on Afib.

## 2017-07-03 NOTE — Progress Notes (Addendum)
Patient arrived to unit per bed.  Reviewed treatment plan and this RN agrees.  Report received from bedside RN, Ginger.  Consent verified.  Patient A & OX 4. Lung sounds diminished to ausculation in all fields. BLE 3+ pitting edema. Cardiac: Afib.  Prepped LUAVF with alcohol and cannulated with two 15 gauge needles.  Pulsation of blood noted.  Flushed access well with saline per protocol.  Connected and secured lines and initiated tx at 1630.  UF goal of 1500 mL and net fluid removal of 1000 mL.  Will continue to monitor.

## 2017-07-03 NOTE — Progress Notes (Signed)
PROGRESS NOTE    Steven [name]  ZHG:[id number] DOB: 09-22-1926 DOA: 06/26/2017 PCP: Orpah Melter, MD    Brief Narrative:  83 year old male who presented with weakness and ambulatory dysfunction.Patient is known to have hypertension, dyslipidemia, end-stage renal disease on hemodialysis(Mondays, Wednesdays and Fridays),atrial fibrillation and chronic indwelling Foley catheter. Apparently he fell right after standing,patient had generalized weakness, no loss of consciousness. On his initial physical examination blood pressure 94/57, heart rate 125, oxygen saturation 88%,moist mucous membranes, lungs clear to auscultation bilaterally, heartS1-S2,present rhythmic, abdomen soft nontender,4+ bilateral lower extremity edema.EKG was atrial fibrillation,rate of 125 bpm,right axis deviation,right bundle branch block.  Patient was admitted to the hospital working diagnosis of atrial fibrillation with rapid ventricular response.   Assessment & Plan:   Principal Problem:   Atrial fibrillation with rapid ventricular response (HCC) Active Problems:   Obesity (BMI 30-39.9)   Hypertensive heart disease   Hyperlipidemia   End-stage renal disease on hemodialysis (HCC)   Generalized weakness   Dehydration   Chronic hypotension   Goals of care, counseling/discussion   Palliative care by specialist   1. Atrial fibrillation. Rate controlled with amiodarone and diltiazem, anticoagulation with warfarin, INR at 2.0.   2. Hypotension. Continue midodrine TID. Will target a MAP greater than 60.   3. ESRD on HD. For HD today, unable to ultrafiltrate due to hypotension, edema is mainly on his lower extremities, no evident pulmonary edema. Will follow blood pressure after HD, if good toleration will plan to discharge in am to SNF. Electrolytes with K at 3,8 and serum bicarbonate at 26.   4.Hypothyroid. On levothyroxine.   5. Depression. On mirtazapine, no confusion or agitation.     DVT prophylaxis:warfarin Code Status:dnr Family Communication:no family at the bedside Disposition Plan:   Consultants:  Nephrology   Palliative care  Procedures:    Antimicrobials:     Subjective: Patient with no dyspnea or chest pain, persistent lower extremity edema and generalized weakness.   Objective: Vitals:   07/02/17 2126 07/03/17 0423 07/03/17 0951 07/03/17 1409  BP: (!) 93/48 (!) 94/46 (!) 89/49 (!) 94/48  Pulse: 100 (!) 104  (!) 109  Resp: 18 18  16   Temp: 98.4 F (36.9 C) 98.3 F (36.8 C)  97.9 F (36.6 C)  TempSrc: Oral Oral    SpO2: 99% 98%  98%  Weight:  92.9 kg (204 lb 12.8 oz)    Height:        Intake/Output Summary (Last 24 hours) at 07/03/2017 1559 Last data filed at 07/03/2017 0800 Gross per 24 hour  Intake 220 ml  Output -  Net 220 ml   Filed Weights   07/01/17 1132 07/02/17 0100 07/03/17 0423  Weight: 94 kg (207 lb 3.7 oz) 95 kg (209 lb 7 oz) 92.9 kg (204 lb 12.8 oz)    Examination:   General: Not in pain or dyspnea, deconditioned Neurology: Awake and alert, non focal  E ENT: mild pallor, no icterus, oral mucosa moist Cardiovascular: No JVD. S1-S2 present, rhythmic, no gallops, rubs, or murmurs. ++++ pitting lower extremity edema. Pulmonary: decreased breath sounds bilaterally at bases, no wheezing, rhonchi or rales. Gastrointestinal. Abdomen protuberant, no organomegaly, non tender, no rebound or guarding Skin. No rashes Musculoskeletal: no joint deformities     Data Reviewed: I have personally reviewed following labs and imaging studies  CBC: Recent Labs  Lab 06/27/17 0300 06/29/17 0902  WBC 8.4 6.4  HGB 11.1* 11.8*  HCT 34.7* 37.6*  MCV 95.1 96.4  PLT  138* 846*   Basic Metabolic Panel: Recent Labs  Lab 06/27/17 0300 06/29/17 0902 07/02/17 0503 07/03/17 0246  NA 133* 134* 135 132*  K 4.8 4.3 3.7 3.8  CL 96* 95* 95* 94*  CO2 24 26 26 26   GLUCOSE 97 91 90 106*  BUN 32* 34* 25* 37*   CREATININE 8.25* 7.29* 4.73* 5.89*  CALCIUM 8.1* 8.4* 8.4* 8.4*  PHOS  --  2.7  --   --    GFR: Estimated Creatinine Clearance: 9.6 mL/min (A) (by C-G formula based on SCr of 5.89 mg/dL (H)). Liver Function Tests: Recent Labs  Lab 06/29/17 0902  ALBUMIN 2.7*   No results for input(s): LIPASE, AMYLASE in the last 168 hours. No results for input(s): AMMONIA in the last 168 hours. Coagulation Profile: Recent Labs  Lab 06/29/17 0319 06/30/17 0559 07/01/17 0535 07/02/17 0503 07/03/17 0246  INR 2.63 2.24 2.13 1.95 2.02   Cardiac Enzymes: No results for input(s): CKTOTAL, CKMB, CKMBINDEX, TROPONINI in the last 168 hours. BNP (last 3 results) No results for input(s): PROBNP in the last 8760 hours. HbA1C: No results for input(s): HGBA1C in the last 72 hours. CBG: No results for input(s): GLUCAP in the last 168 hours. Lipid Profile: No results for input(s): CHOL, HDL, LDLCALC, TRIG, CHOLHDL, LDLDIRECT in the last 72 hours. Thyroid Function Tests: No results for input(s): TSH, T4TOTAL, FREET4, T3FREE, THYROIDAB in the last 72 hours. Anemia Panel: No results for input(s): VITAMINB12, FOLATE, FERRITIN, TIBC, IRON, RETICCTPCT in the last 72 hours.    Radiology Studies: I have reviewed all of the imaging during this hospital visit personally     Scheduled Meds: . allopurinol  100 mg Oral Daily  . amiodarone  200 mg Oral Daily  . atorvastatin  10 mg Oral QPC supper  . diltiazem  240 mg Oral Daily  . doxercalciferol  3 mcg Intravenous Q M,W,F-HD  . feeding supplement (NEPRO CARB STEADY)  237 mL Oral BID BM  . levothyroxine  50 mcg Oral QAC breakfast  . mouth rinse  15 mL Mouth Rinse BID  . medroxyPROGESTERone  5 mg Oral BID  . midodrine  10 mg Oral TID  . mirtazapine  30 mg Oral QPC supper  . multivitamin  1 tablet Oral Daily  . sevelamer carbonate  800 mg Oral QPC supper  . sodium chloride flush  3 mL Intravenous Q12H  . vitamin B-12  1,000 mcg Oral QPC supper  .  warfarin  2 mg Oral QPM  . Warfarin - Physician Dosing Inpatient   Does not apply q1800   Continuous Infusions:   LOS: 5 days        Nahsir Venezia [name], MD [facility] Pager [phone]

## 2017-07-03 NOTE — Progress Notes (Signed)
Dialysis treatment completed.  1000 mL ultrafiltrated and net fluid removal 500 mL.    Patient status unchanged. Lung sounds dimnished to ausculation in all fields. BLE 3+ pitting edema. Cardiac: Afib, RVR.  Disconnected lines and removed needles.  Pressure held for 10 minutes and band aid/gauze dressing applied.  Report given to bedside RN, Alvesha.

## 2017-07-04 DIAGNOSIS — I959 Hypotension, unspecified: Secondary | ICD-10-CM

## 2017-07-04 LAB — BASIC METABOLIC PANEL
Anion gap: 12 (ref 5–15)
BUN: 20 mg/dL (ref 6–20)
CHLORIDE: 94 mmol/L — AB (ref 101–111)
CO2: 28 mmol/L (ref 22–32)
CREATININE: 3.82 mg/dL — AB (ref 0.61–1.24)
Calcium: 8.5 mg/dL — ABNORMAL LOW (ref 8.9–10.3)
GFR calc Af Amer: 15 mL/min — ABNORMAL LOW (ref >60)
GFR, EST NON AFRICAN AMERICAN: 13 mL/min — AB (ref >60)
GLUCOSE: 92 mg/dL (ref 65–99)
POTASSIUM: 4.1 mmol/L (ref 3.5–5.1)
SODIUM: 134 mmol/L — AB (ref 135–145)

## 2017-07-04 LAB — PROTIME-INR
INR: 2.08
Prothrombin Time: 23.2 seconds — ABNORMAL HIGH (ref 11.4–15.2)

## 2017-07-04 MED ORDER — NEPRO/CARBSTEADY PO LIQD: 237.0000 mL | Freq: Two times a day (BID) | ORAL | 0 refills | Status: AC

## 2017-07-04 MED ORDER — ACETAMINOPHEN 500 MG PO TABS: 500.0000 mg | ORAL_TABLET | Freq: Four times a day (QID) | ORAL | 0 refills | Status: AC | PRN

## 2017-07-04 MED ORDER — MIDODRINE HCL 10 MG PO TABS: 10.0000 mg | ORAL_TABLET | Freq: Three times a day (TID) | ORAL | 0 refills | Status: AC

## 2017-07-04 NOTE — Progress Notes (Signed)
Patient discharge teaching given, including activity, diet, follow-up appoints, and medications. Patient verbalized understanding of all discharge instructions. IV access was d/c'd. Vitals are stable. Skin is intact except as charted in most recent assessments. Pt to be escorted out by [facility].

## 2017-07-04 NOTE — Progress Notes (Signed)
Report given to [place] at [employer].

## 2017-07-04 NOTE — Discharge Summary (Signed)
Physician Discharge Summary  Steven [name] VHQ:[id number] DOB: 1927-06-06 DOA: 06/26/2017  PCP: Orpah Melter, MD  Admit date: 06/26/2017 Discharge date: 07/04/2017  Admitted From: Home Disposition:  SNF  Recommendations for Outpatient Follow-up:  1. Follow up with PCP in 1-week 2. Midodrine has been increased to 10 mg tid 3. Metoprolol has been discontinued due to hypotension 4. Target systolic blood pressure greater than 90 mmHg  Home Health: na  Equipment/Devices: na   Discharge Condition: Stable CODE STATUS: dnr  Diet recommendation: Heart healthy and renal dialysis prudent.   Brief/Interim Summary: 83 year old male who presented with weakness and ambulatory dysfunction.Patient is known to have hypertension, dyslipidemia, end-stage renal disease on hemodialysis(Mondays, Wednesdays and Fridays),atrial fibrillation and chronic indwelling Foley catheter. Apparently he fell right after standing,patient had generalized weakness, no loss of consciousness. On his initial physical examination blood pressure 94/57, heart rate 125, oxygen saturation 88%,moist mucous membranes, lungs clear to auscultation bilaterally, heartS1-S2,present irregulary irregular, abdomen soft nontender,4+ bilateral lower extremity edema. Sodium 133, potassium 4.7, chloride 92, bicarbonate 24, glucose 99, BUN 26, creatinine 7.46, white count 8.3, 12.7, hematocrit 40.2, platelets 161, INR 2.2. Urinalysis with 6-30 white cells, greater than 300 protein. Head CT with no acute intracranial findings. Chest x-ray with left atelectasis.EKG was atrial fibrillation rhythm,rate of 125 bpm,right axis deviation,right bundle branch block.  Patient was admitted to the hospital with the working diagnosis of atrial fibrillation with rapid ventricular response.  1. Atrial fibrillation with rapid ventricular response. She was admitted to the medical ward, he was placed on a telemetry monitor, he received IV diltiazem as  well as oral amiodarone and diltiazem. His heart rate improved. Continue anticoagulation with warfarin, discharge INR 2.0.   2. Hypotension. Patient had episodes of hypotension, with systolic blood pressure in the range of 80-90 mmHg, that limited the ability for fully ultrafiltration. Midodrine was increased to 10 mg 3 times daily with good toleration. Patient was able to tolerate hemodialysis yesterday. History discharge systolic blood pressure is 93-98, patient asymptomatic. Will target a systolic blood pressure greater than 90. Metoprolol has been discontinued.   3. End-stage renal disease on hemodialysis. Patient was seen by nephrology, he underwent hemodialysis, his ultrafiltration was limited due to hypotension. Regular schedule Monday Wednesday and Friday. He has express that in case of unable to dialyze successfully he will be interested in hospice referral. Continue home dose of furosemide and sevelamer.  4. Hypothyroid. Continue levothyroxine  5. Depression. Continue mirtazapine  6. Urinary retention. Patient will keep indwelling Foley catheter and will follow-up as an outpatient. Continue tamsulosin.   Discharge Diagnoses:  Principal Problem:   Atrial fibrillation with rapid ventricular response (HCC) Active Problems:   Obesity (BMI 30-39.9)   Hypertensive heart disease   Hyperlipidemia   End-stage renal disease on hemodialysis (HCC)   Generalized weakness   Dehydration   Chronic hypotension   Goals of care, counseling/discussion   Palliative care by specialist    Discharge Instructions   Allergies as of 07/04/2017      Reactions   Hydrocodone Other (See Comments)   Reaction:  Confusion    Methocarbamol Other (See Comments)   Reported by [facility] 03/06/17 - unknown reaction   Penicillins Hives, Other (See Comments)   Has patient had a PCN reaction causing immediate rash, facial/tongue/throat swelling, SOB or lightheadedness with hypotension: No Has patient had  a PCN reaction causing severe rash involving mucus membranes or skin necrosis: No Has patient had a PCN reaction that required hospitalization  No Has patient had a PCN reaction occurring within the last 10 years: No If all of the above answers are "NO", then may proceed with Cephalosporin use.      Medication List    STOP taking these medications   Melatonin 3 MG Tabs   metoprolol succinate 25 MG 24 hr tablet Commonly known as:  TOPROL-XL     TAKE these medications   acetaminophen 500 MG tablet Commonly known as:  TYLENOL Take 1 tablet (500 mg total) by mouth every 6 (six) hours as needed for mild pain, moderate pain, fever or headache. What changed:  how much to take   allopurinol 100 MG tablet Commonly known as:  ZYLOPRIM Take 100 mg by mouth daily.   amiodarone 200 MG tablet Commonly known as:  PACERONE Take 200 mg by mouth daily.   atorvastatin 10 MG tablet Commonly known as:  LIPITOR Take 10 mg by mouth daily after supper.   diltiazem 240 MG 24 hr capsule Commonly known as:  CARDIZEM CD Take 1 capsule (240 mg total) by mouth daily. What changed:    when to take this  additional instructions   feeding supplement (NEPRO CARB STEADY) Liqd Take 237 mLs by mouth 2 (two) times daily between meals.   furosemide 40 MG tablet Commonly known as:  LASIX Take 40 mg by mouth See admin instructions. Take 1 tablet (40 mg) by mouth on Tuesday, Thursday, Saturday mornings   levothyroxine 50 MCG tablet Commonly known as:  SYNTHROID, LEVOTHROID Take 50 mcg by mouth daily.   medroxyPROGESTERone 5 MG tablet Commonly known as:  PROVERA Take 5 mg by mouth 2 (two) times daily.   midodrine 10 MG tablet Commonly known as:  PROAMATINE Take 1 tablet (10 mg total) by mouth 3 (three) times daily. What changed:    when to take this  additional instructions   mirtazapine 30 MG tablet Commonly known as:  REMERON Take 30 mg by mouth daily after supper.   multivitamin Tabs  tablet Take 1 tablet by mouth daily.   sevelamer carbonate 800 MG tablet Commonly known as:  RENVELA Take 800 mg by mouth daily with breakfast.   tamsulosin 0.4 MG Caps capsule Commonly known as:  FLOMAX Take 0.4 mg by mouth daily.   vitamin B-12 500 MCG tablet Commonly known as:  CYANOCOBALAMIN Take 500 mcg by mouth daily after supper.   warfarin 4 MG tablet Commonly known as:  COUMADIN Take 2 mg by mouth daily after supper.      Contact information for after-discharge care    Destination    [facility] .   Service:  Skilled Nursing Contact information: [place]             Allergies  Allergen Reactions  . Hydrocodone Other (See Comments)    Reaction:  Confusion   . Methocarbamol Other (See Comments)    Reported by [facility] 03/06/17 - unknown reaction  . Penicillins Hives and Other (See Comments)    Has patient had a PCN reaction causing immediate rash, facial/tongue/throat swelling, SOB or lightheadedness with hypotension: No Has patient had a PCN reaction causing severe rash involving mucus membranes or skin necrosis: No Has patient had a PCN reaction that required hospitalization No Has patient had a PCN reaction occurring within the last 10 years: No If all of the above answers are "NO", then may proceed with Cephalosporin use.  Consultations:  Nephrology    Procedures/Studies: Dg Chest 2 View  Result Date: 06/26/2017 CLINICAL DATA:  Generalized weakness EXAM: CHEST  2 VIEW COMPARISON:  03/06/2017 FINDINGS: Cardiomegaly. Bibasilar atelectasis or scarring with blunting of left costophrenic angle, likely scarring. No definite visible effusion on the lateral view. No change since prior study. IMPRESSION: Cardiomegaly.  Chronic bibasilar scarring or atelectasis. Electronically Signed   By: Rolm Baptise M.D.   On: 06/26/2017 11:23   Ct Head Wo Contrast  Result  Date: 06/26/2017 CLINICAL DATA:  Generalize weakness for 1 month.  Status post fall. EXAM: CT HEAD WITHOUT CONTRAST TECHNIQUE: Contiguous axial images were obtained from the base of the skull through the vertex without intravenous contrast. COMPARISON:  03/31/2011 FINDINGS: Brain: No evidence of acute infarction, hemorrhage, extra-axial collection, ventriculomegaly, or mass effect. Old left basal ganglia lacunar infarct. Generalized cerebral atrophy. Periventricular white matter low attenuation likely secondary to microangiopathy. Vascular: Cerebrovascular atherosclerotic calcifications are noted. Skull: Negative for fracture or focal lesion. Sinuses/Orbits: Visualized portions of the orbits are unremarkable. Visualized portions of the paranasal sinuses and mastoid air cells are unremarkable. Other: None. IMPRESSION: 1. No acute intracranial pathology. 2. Chronic microvascular disease and cerebral atrophy. Electronically Signed   By: Kathreen Devoid   On: 06/26/2017 11:46       Subjective: Patient feeling well, no nausea or vomiting, no chest pain or dyspnea. Reports pain on his right lower extremity, that is moderate in intensity, worse to touch.   Discharge Exam: Vitals:   07/03/17 2145 07/04/17 0659  BP: (!) 93/55 (!) 98/50  Pulse:  (!) 118  Resp:  19  Temp:  97.8 F (36.6 C)  SpO2:  100%   Vitals:   07/03/17 2033 07/03/17 2115 07/03/17 2145 07/04/17 0659  BP: (!) 92/57 (!) 88/47 (!) 93/55 (!) 98/50  Pulse: (!) 128 (!) 127  (!) 118  Resp:  17  19  Temp:  98.4 F (36.9 C)  97.8 F (36.6 C)  TempSrc:  Oral  Oral  SpO2:  98%  100%  Weight:      Height:        General: Pt is alert, awake, not in acute distress E ENT: mild pallor, no icterus Cardiovascular: RRR, S1/S2 +, no rubs, no gallops Respiratory: CTA bilaterally, no wheezing, no rhonchi Abdominal: Soft, NT, ND, bowel sounds + Extremities: +++ pitting edema bilaterally, no cyanosis    The results of significant diagnostics  from this hospitalization (including imaging, microbiology, ancillary and laboratory) are listed below for reference.     Microbiology: Recent Results (from the past 240 hour(s))  MRSA PCR Screening     Status: None   Collection Time: 06/26/17  9:23 PM  Result Value Ref Range Status   MRSA by PCR NEGATIVE NEGATIVE Final    Comment:        The GeneXpert MRSA Assay (FDA approved for NASAL specimens only), is one component of a comprehensive MRSA colonization surveillance program. It is not intended to diagnose MRSA infection nor to guide or monitor treatment for MRSA infections.      Labs: BNP (last 3 results) Recent Labs    12/08/16 1153  BNP 161.0*   Basic Metabolic Panel: Recent Labs  Lab 06/29/17 0902 07/02/17 0503 07/03/17 0246 07/04/17 0257  NA 134* 135 132* 134*  K 4.3 3.7 3.8 4.1  CL 95* 95* 94* 94*  CO2 26 26 26 28   GLUCOSE 91 90 106* 92  BUN 34* 25* 37* 20  CREATININE 7.29* 4.73* 5.89* 3.82*  CALCIUM 8.4* 8.4* 8.4* 8.5*  PHOS 2.7  --   --   --    Liver Function Tests: Recent Labs  Lab 06/29/17 0902  ALBUMIN 2.7*   No results for input(s): LIPASE, AMYLASE in the last 168 hours. No results for input(s): AMMONIA in the last 168 hours. CBC: Recent Labs  Lab 06/29/17 0902 07/03/17 1620  WBC 6.4 7.9  HGB 11.8* 11.1*  HCT 37.6* 34.1*  MCV 96.4 92.9  PLT 134* 137*   Cardiac Enzymes: No results for input(s): CKTOTAL, CKMB, CKMBINDEX, TROPONINI in the last 168 hours. BNP: Invalid input(s): POCBNP CBG: No results for input(s): GLUCAP in the last 168 hours. D-Dimer No results for input(s): DDIMER in the last 72 hours. Hgb A1c No results for input(s): HGBA1C in the last 72 hours. Lipid Profile No results for input(s): CHOL, HDL, LDLCALC, TRIG, CHOLHDL, LDLDIRECT in the last 72 hours. Thyroid function studies No results for input(s): TSH, T4TOTAL, T3FREE, THYROIDAB in the last 72 hours.  Invalid input(s): FREET3 Anemia work up No results for  input(s): VITAMINB12, FOLATE, FERRITIN, TIBC, IRON, RETICCTPCT in the last 72 hours. Urinalysis    Component Value Date/Time   COLORURINE AMBER (A) 06/26/2017 1623   APPEARANCEUR TURBID (A) 06/26/2017 1623   LABSPEC 1.019 06/26/2017 1623   PHURINE 7.0 06/26/2017 1623   GLUCOSEU NEGATIVE 06/26/2017 1623   HGBUR NEGATIVE 06/26/2017 1623   BILIRUBINUR NEGATIVE 06/26/2017 1623   KETONESUR NEGATIVE 06/26/2017 1623   PROTEINUR >=300 (A) 06/26/2017 1623   UROBILINOGEN 0.2 06/01/2013 1316   NITRITE NEGATIVE 06/26/2017 1623   LEUKOCYTESUR MODERATE (A) 06/26/2017 1623   Sepsis Labs Invalid input(s): PROCALCITONIN,  WBC,  LACTICIDVEN Microbiology Recent Results (from the past 240 hour(s))  MRSA PCR Screening     Status: None   Collection Time: 06/26/17  9:23 PM  Result Value Ref Range Status   MRSA by PCR NEGATIVE NEGATIVE Final    Comment:        The GeneXpert MRSA Assay (FDA approved for NASAL specimens only), is one component of a comprehensive MRSA colonization surveillance program. It is not intended to diagnose MRSA infection nor to guide or monitor treatment for MRSA infections.      Time coordinating discharge: 45 minutes  SIGNED:   Tawni Millers, MD  [facility] 07/04/2017, 8:29 AM Pager [phone]  If 7PM-7AM, please contact night-coverage [email or web address] Password [email or web address]

## 2017-07-04 NOTE — Progress Notes (Signed)
Patient will Discharge To: [facility] Anticipated DC Date: 07-04-17 Family Notified:Yes, Left voice message for Adine Madura  [phone] Transport By: Corey Harold   Per MD patient ready for DC to [place] . RN, patient, patient's family, and facility notified of DC. Assessment, Fl2/Pasrr, and Discharge Summary sent to facility. RN given number for report [phone] speak to Nurse on [facility]). DC packet on chart. Ambulance transport requested for patient.   CSW signing off.  Reed Breech LCSWA [phone]

## 2017-07-04 NOTE — Progress Notes (Signed)
Per [facility] will accept new patients at 4 pm. So patient will have to wait to be discharged in the afternoon.

## 2017-07-22 ENCOUNTER — Other Ambulatory Visit: Payer: Self-pay

## 2017-07-22 ENCOUNTER — Emergency Department (HOSPITAL_COMMUNITY): Payer: Medicare Other

## 2017-07-22 ENCOUNTER — Inpatient Hospital Stay (HOSPITAL_COMMUNITY)
Admission: EM | Admit: 2017-07-22 | Discharge: 2017-07-28 | DRG: 871 | Disposition: E | Payer: Medicare Other | Attending: Internal Medicine | Admitting: Internal Medicine

## 2017-07-22 ENCOUNTER — Encounter (HOSPITAL_COMMUNITY): Payer: Self-pay | Admitting: Emergency Medicine

## 2017-07-22 DIAGNOSIS — R680 Hypothermia, not associated with low environmental temperature: Secondary | ICD-10-CM | POA: Diagnosis present

## 2017-07-22 DIAGNOSIS — Z9842 Cataract extraction status, left eye: Secondary | ICD-10-CM | POA: Diagnosis not present

## 2017-07-22 DIAGNOSIS — A419 Sepsis, unspecified organism: Principal | ICD-10-CM | POA: Diagnosis present

## 2017-07-22 DIAGNOSIS — Z79890 Hormone replacement therapy (postmenopausal): Secondary | ICD-10-CM | POA: Diagnosis not present

## 2017-07-22 DIAGNOSIS — N400 Enlarged prostate without lower urinary tract symptoms: Secondary | ICD-10-CM | POA: Diagnosis present

## 2017-07-22 DIAGNOSIS — Z992 Dependence on renal dialysis: Secondary | ICD-10-CM | POA: Diagnosis not present

## 2017-07-22 DIAGNOSIS — Z96651 Presence of right artificial knee joint: Secondary | ICD-10-CM | POA: Diagnosis present

## 2017-07-22 DIAGNOSIS — E872 Acidosis: Secondary | ICD-10-CM | POA: Diagnosis present

## 2017-07-22 DIAGNOSIS — M1990 Unspecified osteoarthritis, unspecified site: Secondary | ICD-10-CM | POA: Diagnosis present

## 2017-07-22 DIAGNOSIS — C61 Malignant neoplasm of prostate: Secondary | ICD-10-CM | POA: Diagnosis present

## 2017-07-22 DIAGNOSIS — Z961 Presence of intraocular lens: Secondary | ICD-10-CM | POA: Diagnosis present

## 2017-07-22 DIAGNOSIS — R079 Chest pain, unspecified: Secondary | ICD-10-CM | POA: Insufficient documentation

## 2017-07-22 DIAGNOSIS — N186 End stage renal disease: Secondary | ICD-10-CM

## 2017-07-22 DIAGNOSIS — Z888 Allergy status to other drugs, medicaments and biological substances status: Secondary | ICD-10-CM

## 2017-07-22 DIAGNOSIS — Z66 Do not resuscitate: Secondary | ICD-10-CM | POA: Diagnosis present

## 2017-07-22 DIAGNOSIS — E039 Hypothyroidism, unspecified: Secondary | ICD-10-CM | POA: Diagnosis present

## 2017-07-22 DIAGNOSIS — Y95 Nosocomial condition: Secondary | ICD-10-CM | POA: Diagnosis present

## 2017-07-22 DIAGNOSIS — E559 Vitamin D deficiency, unspecified: Secondary | ICD-10-CM | POA: Diagnosis present

## 2017-07-22 DIAGNOSIS — M109 Gout, unspecified: Secondary | ICD-10-CM | POA: Diagnosis present

## 2017-07-22 DIAGNOSIS — Z87442 Personal history of urinary calculi: Secondary | ICD-10-CM | POA: Diagnosis not present

## 2017-07-22 DIAGNOSIS — Z9841 Cataract extraction status, right eye: Secondary | ICD-10-CM

## 2017-07-22 DIAGNOSIS — Z515 Encounter for palliative care: Secondary | ICD-10-CM | POA: Diagnosis present

## 2017-07-22 DIAGNOSIS — I120 Hypertensive chronic kidney disease with stage 5 chronic kidney disease or end stage renal disease: Secondary | ICD-10-CM | POA: Diagnosis present

## 2017-07-22 DIAGNOSIS — Z87891 Personal history of nicotine dependence: Secondary | ICD-10-CM

## 2017-07-22 DIAGNOSIS — E875 Hyperkalemia: Secondary | ICD-10-CM | POA: Diagnosis present

## 2017-07-22 DIAGNOSIS — Z885 Allergy status to narcotic agent status: Secondary | ICD-10-CM

## 2017-07-22 DIAGNOSIS — R6521 Severe sepsis with septic shock: Secondary | ICD-10-CM | POA: Diagnosis present

## 2017-07-22 DIAGNOSIS — Z7901 Long term (current) use of anticoagulants: Secondary | ICD-10-CM

## 2017-07-22 DIAGNOSIS — E8770 Fluid overload, unspecified: Secondary | ICD-10-CM | POA: Diagnosis present

## 2017-07-22 DIAGNOSIS — I4891 Unspecified atrial fibrillation: Secondary | ICD-10-CM | POA: Diagnosis present

## 2017-07-22 DIAGNOSIS — Z880 Allergy status to penicillin: Secondary | ICD-10-CM

## 2017-07-22 DIAGNOSIS — J189 Pneumonia, unspecified organism: Secondary | ICD-10-CM

## 2017-07-22 LAB — BASIC METABOLIC PANEL
ANION GAP: 21 — AB (ref 5–15)
BUN: 47 mg/dL — ABNORMAL HIGH (ref 6–20)
CALCIUM: 8 mg/dL — AB (ref 8.9–10.3)
CO2: 17 mmol/L — ABNORMAL LOW (ref 22–32)
CREATININE: 8.16 mg/dL — AB (ref 0.61–1.24)
Chloride: 91 mmol/L — ABNORMAL LOW (ref 101–111)
GFR, EST AFRICAN AMERICAN: 6 mL/min — AB (ref >60)
GFR, EST NON AFRICAN AMERICAN: 5 mL/min — AB (ref >60)
Glucose, Bld: 92 mg/dL (ref 65–99)
Potassium: 6.1 mmol/L — ABNORMAL HIGH (ref 3.5–5.1)
Sodium: 129 mmol/L — ABNORMAL LOW (ref 135–145)

## 2017-07-22 LAB — I-STAT CG4 LACTIC ACID, ED
LACTIC ACID, VENOUS: 3.34 mmol/L — AB (ref 0.5–1.9)
Lactic Acid, Venous: 3.13 mmol/L (ref 0.5–1.9)
Lactic Acid, Venous: 2.91 mmol/L (ref 0.5–1.9)

## 2017-07-22 LAB — I-STAT TROPONIN, ED: TROPONIN I, POC: 0.18 ng/mL — AB (ref 0.00–0.08)

## 2017-07-22 LAB — I-STAT CHEM 8, ED
BUN: 57 mg/dL — AB (ref 6–20)
CALCIUM ION: 0.83 mmol/L — AB (ref 1.15–1.40)
CHLORIDE: 95 mmol/L — AB (ref 101–111)
CREATININE: 8.6 mg/dL — AB (ref 0.61–1.24)
Glucose, Bld: 75 mg/dL (ref 65–99)
HCT: 35 % — ABNORMAL LOW (ref 39.0–52.0)
Hemoglobin: 11.9 g/dL — ABNORMAL LOW (ref 13.0–17.0)
Potassium: 6.1 mmol/L — ABNORMAL HIGH (ref 3.5–5.1)
Sodium: 129 mmol/L — ABNORMAL LOW (ref 135–145)
TCO2: 21 mmol/L — AB (ref 22–32)

## 2017-07-22 LAB — CBC
HCT: 32.3 % — ABNORMAL LOW (ref 39.0–52.0)
HEMOGLOBIN: 10.7 g/dL — AB (ref 13.0–17.0)
MCH: 30.9 pg (ref 26.0–34.0)
MCHC: 33.1 g/dL (ref 30.0–36.0)
MCV: 93.4 fL (ref 78.0–100.0)
PLATELETS: 149 10*3/uL — AB (ref 150–400)
RBC: 3.46 MIL/uL — AB (ref 4.22–5.81)
RDW: 17.6 % — ABNORMAL HIGH (ref 11.5–15.5)
WBC: 9.9 10*3/uL (ref 4.0–10.5)

## 2017-07-22 LAB — INFLUENZA PANEL BY PCR (TYPE A & B)
INFLAPCR: NEGATIVE
INFLBPCR: NEGATIVE

## 2017-07-22 LAB — PROTIME-INR
INR: 6.34
PROTHROMBIN TIME: 55.5 s — AB (ref 11.4–15.2)

## 2017-07-22 MED ORDER — HYDROMORPHONE HCL 1 MG/ML IJ SOLN
1.0000 mg | INTRAMUSCULAR | Status: AC
  Administered 2017-07-22: 1 mg via INTRAVENOUS
  Filled 2017-07-22: qty 1

## 2017-07-22 MED ORDER — FUROSEMIDE 10 MG/ML IJ SOLN
40.0000 mg | Freq: Once | INTRAMUSCULAR | Status: AC
Start: 2017-07-22 — End: 2017-07-22
  Administered 2017-07-22: 40 mg via INTRAVENOUS
  Filled 2017-07-22: qty 4

## 2017-07-22 MED ORDER — HALOPERIDOL LACTATE 2 MG/ML PO CONC
0.5000 mg | ORAL | Status: DC | PRN
  Filled 2017-07-22: qty 0.3

## 2017-07-22 MED ORDER — SODIUM CHLORIDE 0.9 % IV BOLUS (SEPSIS)
1000.0000 mL | Freq: Once | INTRAVENOUS | Status: AC
  Administered 2017-07-22: 1000 mL via INTRAVENOUS

## 2017-07-22 MED ORDER — DEXTROSE 5 % IV SOLN: 2.0000 g | Freq: Once | INTRAVENOUS | Status: DC

## 2017-07-22 MED ORDER — MORPHINE SULFATE (PF) 4 MG/ML IV SOLN: 1.0000 mg | INTRAVENOUS | Status: DC | PRN

## 2017-07-22 MED ORDER — LORAZEPAM 2 MG/ML IJ SOLN
1.0000 mg | Freq: Four times a day (QID) | INTRAMUSCULAR | Status: DC
  Administered 2017-07-22: 1 mg via INTRAVENOUS
  Filled 2017-07-22: qty 1

## 2017-07-22 MED ORDER — ONDANSETRON HCL 4 MG/2ML IJ SOLN: 4.0000 mg | Freq: Four times a day (QID) | INTRAMUSCULAR | Status: DC | PRN

## 2017-07-22 MED ORDER — DEXTROSE 50 % IV SOLN
1.0000 | Freq: Once | INTRAVENOUS | Status: AC
  Administered 2017-07-22: 50 mL via INTRAVENOUS
  Filled 2017-07-22: qty 50

## 2017-07-22 MED ORDER — LORAZEPAM 2 MG/ML IJ SOLN: 1.0000 mg | INTRAMUSCULAR | Status: DC | PRN

## 2017-07-22 MED ORDER — HYDROMORPHONE HCL 1 MG/ML IJ SOLN: 1.0000 mg | INTRAMUSCULAR | Status: DC

## 2017-07-22 MED ORDER — ACETAMINOPHEN 650 MG RE SUPP: 650.0000 mg | Freq: Four times a day (QID) | RECTAL | Status: DC | PRN

## 2017-07-22 MED ORDER — MIRTAZAPINE 30 MG PO TABS: 30.0000 mg | ORAL_TABLET | Freq: Every day | ORAL | Status: DC

## 2017-07-22 MED ORDER — ONDANSETRON 4 MG PO TBDP: 4.0000 mg | ORAL_TABLET | Freq: Four times a day (QID) | ORAL | Status: DC | PRN

## 2017-07-22 MED ORDER — INSULIN ASPART 100 UNIT/ML ~~LOC~~ SOLN
5.0000 [IU] | Freq: Once | SUBCUTANEOUS | Status: AC
  Administered 2017-07-22: 5 [IU] via INTRAVENOUS
  Filled 2017-07-22: qty 1

## 2017-07-22 MED ORDER — GLYCOPYRROLATE 0.2 MG/ML IJ SOLN: 0.2000 mg | INTRAMUSCULAR | Status: DC | PRN

## 2017-07-22 MED ORDER — ACETAMINOPHEN 325 MG PO TABS: 650.0000 mg | ORAL_TABLET | Freq: Four times a day (QID) | ORAL | Status: DC | PRN

## 2017-07-22 MED ORDER — HYDROMORPHONE HCL 1 MG/ML IJ SOLN: 1.0000 mg | Freq: Two times a day (BID) | INTRAMUSCULAR | Status: DC | PRN

## 2017-07-22 MED ORDER — VANCOMYCIN HCL IN DEXTROSE 1-5 GM/200ML-% IV SOLN: 1000.0000 mg | Freq: Once | INTRAVENOUS | Status: DC

## 2017-07-22 MED ORDER — BIOTENE DRY MOUTH MT LIQD: 15.0000 mL | OROMUCOSAL | Status: DC | PRN

## 2017-07-22 MED ORDER — HYDROMORPHONE HCL 1 MG/ML IJ SOLN: 1.0000 mg | INTRAMUSCULAR | Status: DC | PRN

## 2017-07-22 MED ORDER — HALOPERIDOL LACTATE 5 MG/ML IJ SOLN: 0.5000 mg | INTRAMUSCULAR | Status: DC | PRN

## 2017-07-22 MED ORDER — LORAZEPAM 1 MG PO TABS: 1.0000 mg | ORAL_TABLET | ORAL | Status: DC | PRN

## 2017-07-22 MED ORDER — SODIUM CHLORIDE 0.9 % IV BOLUS (SEPSIS): 1000.0000 mL | Freq: Once | INTRAVENOUS | Status: DC

## 2017-07-22 MED ORDER — GLYCOPYRROLATE 1 MG PO TABS: 1.0000 mg | ORAL_TABLET | ORAL | Status: DC | PRN

## 2017-07-22 MED ORDER — DIPHENHYDRAMINE HCL 50 MG/ML IJ SOLN: 12.5000 mg | INTRAMUSCULAR | Status: DC | PRN

## 2017-07-22 MED ORDER — HALOPERIDOL 1 MG PO TABS: 0.5000 mg | ORAL_TABLET | ORAL | Status: DC | PRN

## 2017-07-22 MED ORDER — INSULIN ASPART 100 UNIT/ML IV SOLN: 5.0000 [IU] | Freq: Once | INTRAVENOUS | Status: DC

## 2017-07-22 MED ORDER — POLYVINYL ALCOHOL 1.4 % OP SOLN: 1.0000 [drp] | Freq: Four times a day (QID) | OPHTHALMIC | Status: DC | PRN

## 2017-07-22 MED ORDER — LORAZEPAM 2 MG/ML PO CONC: 1.0000 mg | ORAL | Status: DC | PRN

## 2017-07-22 MED ORDER — HYDROMORPHONE HCL 1 MG/ML IJ SOLN
1.0000 mg | INTRAMUSCULAR | Status: DC | PRN
Start: 2017-07-22 — End: 2017-07-22
  Administered 2017-07-22: 1 mg via INTRAVENOUS
  Filled 2017-07-22: qty 1

## 2017-07-22 MED ORDER — AMIODARONE HCL 200 MG PO TABS: 200.0000 mg | ORAL_TABLET | Freq: Every day | ORAL | Status: DC

## 2017-07-22 MED ORDER — VANCOMYCIN HCL 10 G IV SOLR
2000.0000 mg | Freq: Once | INTRAVENOUS | Status: AC
  Administered 2017-07-22: 2000 mg via INTRAVENOUS
  Filled 2017-07-22: qty 2000

## 2017-07-22 MED ORDER — DEXTROSE 5 % IV SOLN
2.0000 g | Freq: Once | INTRAVENOUS | Status: AC
  Administered 2017-07-22: 2 g via INTRAVENOUS
  Filled 2017-07-22: qty 2

## 2017-07-27 LAB — CULTURE, BLOOD (ROUTINE X 2)
CULTURE: NO GROWTH
Culture: NO GROWTH
SPECIAL REQUESTS: ADEQUATE
Special Requests: ADEQUATE

## 2017-07-28 NOTE — ED Notes (Signed)
CCM and renal MD at bedside.

## 2017-07-28 NOTE — Progress Notes (Signed)
   2017/08/03 1100  Clinical Encounter Type  Visited With Patient;Health care provider  Visit Type ED  Referral From Nurse  Consult/Referral To Chaplain  Spiritual Encounters  Spiritual Needs Prayer   Responded to a page from the nurse for an EOL.  Patient only has a couple of close friends as his wife and son have both died.  Patient wanted to talk about his faith and share some of his life story.  Stayed with the patient for awhile providing comfort and hospitality. He is not afraid to die as he is very grounded in his faith.   Prayed with the patient.  He will be admitted and I will follow up on him.   Chaplain Katherene Ponto

## 2017-07-28 NOTE — ED Triage Notes (Signed)
Pt brought to ED by [facility] from [facility] for a cp that started one hour ago, worse when coughing. HD pt MWF. A fib on the monitor 324 mg ASA PO given by [facility] pta to ED.

## 2017-07-28 NOTE — Progress Notes (Addendum)
Palliative Medicine RN Note: Met with patient. He is groggy, but he is clear on the fact that he is dying. When I asked him if he had heard about inpatient hospice, he said yes and that he just wants to spend as much time as possible with his friends and family during the few days he has left.   BP currently 53/39. He is currently complaining about having to wear O2. Called decision maker/friend Steven [name] [phone]. She states he has been complaining of chest pain for the last few days and has said he knows his time is short. Goal per patient and caregiver is strictly comfort.   Spoke with Dr Hilma Favors. Obtained orders for comfort. Normally Dr Hilma Favors would recommend inpatient hospice, but with a BP this low and initiation of opioids, PMT recommends pt NOT be transferred, as he is at very high risk of dying in the ambulance. She adjusted orders. Morphine is contraindicated in renal patients due to risk of myoclonus, so patient has been started on dilaudid.  Gold DNR form at the bedside.  Marjie Skiff Kenrick Pore, RN, BSN, [facility] Jul 26, 2017 10:15 AM Office [phone]

## 2017-07-28 NOTE — ED Provider Notes (Signed)
Patient has been changed to comfort care.  Patient realizes this and is understanding that he is going to die.  Nephrology has seen patient and he is not a dialysis candidate because he is so hypotensive and ill.  He understands this as well and states he is ready to meet Jesus.  Thus, ICU cancels consult because he needs palliative care/comfort care.  Hospitalist consulted and will admit.   Sherwood Gambler, MD 17-Aug-2017 [phone]

## 2017-07-28 NOTE — Death Summary Note (Signed)
Death Summary  Steven [name] VQQ:[id number] DOB: [birth year] DOA: 08-13-2017  PCP: Steven Melter, MD   Admit date: 08/13/2017 Date of Death: August 13, 2017  Final Diagnoses:  Principal Problem:   Sepsis due to pneumonia Steven [name]) Active Problems:   CKD (chronic kidney disease) stage V requiring chronic dialysis ([place])   History of present illness:  Steven [name] is a 84 y.o. male with medical history significant for chronic kidney disease on dialysis, autonomic dysfunction with chronic hypotension with systolic blood pressures typically in the 80-90 range despite midodrine, atrial fibrillation, hypothyroidism, depression, urinary retention requiring chronic Foley.  Patient was last discharged on 12/8 after an admission for atrial fibrillation with RVR.  He was able to maintain rate control with a combination of calcium channel blocker and amiodarone.  Because of recurrent hypotension his beta-blocker was discontinued and his midodrine dosage was increased.  He typically dialyzes on MWF ultrafiltration has been limited recently by recurrent hypotension.  He reported prior to most recent discharge that in the event he was unable to dialyze successfully he would be interested in a hospice referral.  He was continued on Lasix and sevelamer at time of discharge.  Patient returned to the ER today from [facility] rehabilitation reporting 1 hours worth of chest pain that was worse with coughing.  He was found to be in A. fib on the monitor.  Initial presenting BP was 79/43, he was slightly tachycardic at 118, he was hypothermic with a rectal temperature of 96.4 and he was mildly tachypneic with respiratory rates between 22 and 30 breaths/min.  He had been empirically placed on 2 L nasal cannula was maintaining saturations of 97%.  His initial lactic acid was 3.34, poc troponin was 0.18, white count was normal, differential not obtained.  Because he is on Coumadin and INR was checked and this was elevated at  6.34.  Rest x-ray revealed moderate left pleural effusion and associated bibasilar opacity which may represent pneumonia.  Acute right lung base opacity could represent atelectasis or pneumonia.  Influenza PCR was negative.  Because of his presenting symptomatology critical care medicine was consulted but given patient's current DNR status and recent declaration that if he were unable to tolerate dialysis he would wish hospice care there were no urgent critical care needs identified.  EDP reported that the patient's nephrologist also spoke at length with the patient and told him he was no longer a candidate to dialysis.  Patient agreed and states that "I am ready to die, I am ready to see myeloid and have been."  He will be admitted for comfort measures.   [name] Course:  The patient was admitted at 9:57 AM today with plans to initiate comfort measures.  Palliative medicine was consulted and assisted in adjusting his pain medications from morphine to fentanyl to minimize myoclonus in a patient with chronic kidney disease with history of dialysis.  As noted in the H&P patient had stated at time of admission he was ready to die.  End of life orders and comfort measures were initiated.  At 12:09 PM it was found apneic and pulseless.  Attending MD Dr. Lonny [name] notified.  Patient pronounced.   Time: 11-15-1207 pm  Signed:  Cloe [name] L. ANP [facility] 08/13/2017, 3:36 PM

## 2017-07-28 NOTE — ED Notes (Signed)
Pt states that he has a preplanned and prepaid funeral and burial plot with Juanita [employer]ver-- pt told chaplain. Pt states "I have lived a good life, I am ready to go"

## 2017-07-28 NOTE — ED Provider Notes (Signed)
[facility] EMERGENCY DEPARTMENT Provider Note   CSN: [id number] Arrival date & time: 15-Aug-2017  2376     History   Chief Complaint Chief Complaint  Patient presents with  . Chest Pain   Level 5 caveat due to acuity of condition HPI Steven [name] is a 84 y.o. male.  The history is provided by the patient.  Chest Pain   This is a new problem. Episode onset:   Prior to arrival. The problem occurs constantly. The pain is present in the substernal region. The pain is moderate. The quality of the pain is described as brief. Associated symptoms include cough and shortness of breath.  Patient presents from nursing facility for chest pain due to cough.  Last session was on 12/22 He is due for dialysis today History is limited due to acuity of condition  He denies recent fall but reports chronic pain in right LE  He is a DNR Past Medical History:  Diagnosis Date  . Anemia   . Atrial fibrillation ([place])   . BPH (benign prostatic hyperplasia)   . CKD (chronic kidney disease), stage IV ([place])   . Gout   . HX: long term anticoagulant use   . Hypercalcemia   . Hypertension   . Kidney stone   . Mitral valve disorders(424.0)   . Osteoarthritis   . Prostate CA ([place])   . PVC (premature ventricular contraction)   . Vitamin D deficiency     Patient Active Problem List   Diagnosis Date Noted  . Chronic hypotension   . Goals of care, counseling/discussion   . Palliative care by specialist   . Dehydration 06/26/2017  . Atrial fibrillation with rapid ventricular response ([place])   . ESRD on dialysis ([place])   . Essential hypertension   . Acute respiratory failure with hypoxia ([place])   . Generalized weakness   . Shortness of breath 12/08/2016  . End-stage renal disease on hemodialysis ([place]) 12/08/2016  . Chronic kidney disease, stage IV (severe) ([place])   . Long term current use of anticoagulant therapy   . Hyperlipidemia   . Prostate cancer ([place])   . Acute blood  loss anemia 06/08/2013  . Obesity (BMI 30-39.9)   . Atrial fibrillation ([place])   . Hypertensive heart disease   . Mitral valve disease   . S/P right TKA 06/06/2013    Past Surgical History:  Procedure Laterality Date  . CARDIOVERSION N/A 12/11/2016   Procedure: CARDIOVERSION;  Surgeon: Josue Hector, MD;  Location: [facility] ENDOSCOPY;  Service: Cardiovascular;  Laterality: N/A;  . EYE SURGERY Bilateral    cataract extraction with IOL  . JOINT REPLACEMENT Left 2012   knee  . KIDNEY STONE SURGERY    . SP AV DIALYSIS SHUNT ACCESS EXISTING *L* Left    no dialysis  . TEE WITHOUT CARDIOVERSION N/A 12/11/2016   Procedure: TRANSESOPHAGEAL ECHOCARDIOGRAM (TEE);  Surgeon: Josue Hector, MD;  Location[facility] ENDOSCOPY;  Service: Cardiovascular;  Laterality: N/A;  . TOTAL KNEE ARTHROPLASTY Right 06/06/2013   Procedure: RIGHT TOTAL KNEE ARTHROPLASTY;  Surgeon: Mauri Pole, MD;  Location: [facility] ORS;  Service: Orthopedics;  Laterality: Right;       Home Medications    Prior to Admission medications   Medication Sig Start Date End Date Taking? Authorizing Provider  acetaminophen (TYLENOL) 500 MG tablet Take 1 tablet (500 mg total) by mouth every 6 (six) hours as needed for mild pain, moderate pain, fever or headache. 07/04/17   Arrien,  Jimmy Picket, MD  allopurinol (ZYLOPRIM) 100 MG tablet Take 100 mg by mouth daily.     [provider]  amiodarone (PACERONE) 200 MG tablet Take 200 mg by mouth daily.    [provider]  atorvastatin (LIPITOR) 10 MG tablet Take 10 mg by mouth daily after supper.     [provider]  diltiazem (CARDIZEM CD) 240 MG 24 hr capsule Take 1 capsule (240 mg total) by mouth daily. Patient taking differently: Take 240 mg by mouth See admin instructions. Take 1 capsule (240 mg) by mouth on Sunday, Tuesday, Thursday, Saturday morning (non-dialysis days) 06/14/13   [name], PA-C  furosemide (LASIX) 40 MG tablet Take 40 mg by mouth See admin  instructions. Take 1 tablet (40 mg) by mouth on Tuesday, Thursday, Saturday mornings    [provider]  levothyroxine (SYNTHROID, LEVOTHROID) 50 MCG tablet Take 50 mcg by mouth daily.     [provider]  medroxyPROGESTERone (PROVERA) 5 MG tablet Take 5 mg by mouth 2 (two) times daily.     [provider]  midodrine (PROAMATINE) 10 MG tablet Take 1 tablet (10 mg total) by mouth 3 (three) times daily. 07/04/17 08/03/17  Arrien, Jimmy Picket, MD  mirtazapine (REMERON) 30 MG tablet Take 30 mg by mouth daily after supper.     [provider]  multivitamin (RENA-VIT) TABS tablet Take 1 tablet by mouth daily.     [provider]  Nutritional Supplements (FEEDING SUPPLEMENT, NEPRO CARB STEADY,) LIQD Take 237 mLs by mouth 2 (two) times daily between meals. 07/04/17 08/03/17  Arrien, Jimmy Picket, MD  sevelamer carbonate (RENVELA) 800 MG tablet Take 800 mg by mouth daily with breakfast.     [provider]  tamsulosin (FLOMAX) 0.4 MG CAPS capsule Take 0.4 mg by mouth daily.    [provider]  vitamin B-12 (CYANOCOBALAMIN) 500 MCG tablet Take 500 mcg by mouth daily after supper.    [provider]  warfarin (COUMADIN) 4 MG tablet Take 2 mg by mouth daily after supper.     [provider]    Family History No family history on file.  Social History Social History   Tobacco Use  . Smoking status: Passive Smoke Exposure - Never Smoker  . Smokeless tobacco: Former Network engineer Use Topics  . Alcohol use: Yes    Comment: attended [employer]  . Drug use: No     Allergies   Hydrocodone; Methocarbamol; and Penicillins   Review of Systems Review of Systems  Unable to perform ROS: Acuity of condition  Respiratory: Positive for cough and shortness of breath.   Cardiovascular: Positive for chest pain.     Physical Exam Updated Vital Signs BP (!) 66/31   Pulse (!) 127   Temp (!) 96.4 F (35.8 C) (Rectal)   Resp  (!) 22   Ht 1.727 m (5\' 8" )   Wt 126.1 kg (278 lb)   SpO2 97%   BMI 42.27 kg/m   Physical Exam CONSTITUTIONAL: Elderly, chronically ill-appearing HEAD: Normocephalic/atraumatic EYES: EOMI/PERRL ENMT: Mucous membranes moist NECK: supple no meningeal signs SPINE/BACK:entire spine nontender CV: Tachycardic, irregular LUNGS: Crackles bilaterally ABDOMEN: soft, nontender GU:no cva tenderness NEURO: Pt is awake/alert/appropriate EXTREMITIES: pulses normal/equal, full ROM, thrill noted to left UE - dialysis access SKIN: warm, color normal, healed ulcer to heel of right foot, no signs of acute trauma to extremities PSYCH: no abnormalities of mood noted, alert and oriented to situation   ED Treatments /  Results  Labs (all labs ordered are listed, but only abnormal results are displayed) Labs Reviewed  BASIC METABOLIC PANEL - Abnormal; Notable for the following components:      Result Value   Sodium 129 (*)    Potassium 6.1 (*)    Chloride 91 (*)    CO2 17 (*)    BUN 47 (*)    Creatinine, Ser 8.16 (*)    Calcium 8.0 (*)    GFR calc non Af Amer 5 (*)    GFR calc Af Amer 6 (*)    Anion gap 21 (*)    All other components within normal limits  CBC - Abnormal; Notable for the following components:   RBC 3.46 (*)    Hemoglobin 10.7 (*)    HCT 32.3 (*)    RDW 17.6 (*)    Platelets 149 (*)    All other components within normal limits  I-STAT TROPONIN, ED - Abnormal; Notable for the following components:   Troponin i, poc 0.18 (*)    All other components within normal limits  I-STAT CG4 LACTIC ACID, ED - Abnormal; Notable for the following components:   Lactic Acid, Venous 3.34 (*)    All other components within normal limits  I-STAT CHEM 8, ED - Abnormal; Notable for the following components:   Sodium 129 (*)    Potassium 6.1 (*)    Chloride 95 (*)    BUN 57 (*)    Creatinine, Ser 8.60 (*)    Calcium, Ion 0.83 (*)    TCO2 21 (*)    Hemoglobin 11.9 (*)    HCT 35.0 (*)     All other components within normal limits  I-STAT CG4 LACTIC ACID, ED - Abnormal; Notable for the following components:   Lactic Acid, Venous 3.13 (*)    All other components within normal limits  I-STAT CG4 LACTIC ACID, ED - Abnormal; Notable for the following components:   Lactic Acid, Venous 2.91 (*)    All other components within normal limits  CULTURE, BLOOD (ROUTINE X 2)  CULTURE, BLOOD (ROUTINE X 2)  URINE CULTURE  INFLUENZA PANEL BY PCR (TYPE A & B)  PROTIME-INR    EKG  EKG Interpretation  Date/Time:  August 21, 2017 05:29:39 EST Ventricular Rate:  123 PR Interval:    QRS Duration: 123 QT Interval:  390 QTC Calculation: 558 R Axis:   172 Text Interpretation:  Atrial fibrillation unable to provide further detail due to artifact Confirmed by Ripley Fraise [phone]) on 2017/08/21 5:32:03 AM       Radiology Dg Chest Portable 1 View  Result Date: 08/21/2017 CLINICAL DATA:  84 y/o M; shortness of breath, pneumonia, chest pain, hypotension. EXAM: PORTABLE CHEST 1 VIEW COMPARISON:  06/26/2017 chest radiograph FINDINGS: Stable enlarged cardiac silhouette. Aortic atherosclerosis with calcification. Moderate left pleural effusion and left basilar opacity. Streaky opacity of right lung base. No acute osseous abnormality is evident. IMPRESSION: Moderate left pleural effusion and associated basilar opacity which may represent pneumonia. Streaky right lung base opacity may represent atelectasis or pneumonia. Electronically Signed   By: Kristine Garbe M.D.   On: 08-21-17 06:00    Procedures Procedures  CRITICAL CARE Performed by: Sharyon Cable Total critical care time: 40 minutes Critical care time was exclusive of separately billable procedures and treating other patients. Critical care was necessary to treat or prevent imminent or life-threatening deterioration. Critical care was time spent personally by me on the following activities: development of  treatment plan with patient and/or surrogate as well as nursing, discussions with consultants, evaluation of patient's response to treatment, examination of patient, obtaining history from patient or surrogate, ordering and performing treatments and interventions, ordering and review of laboratory studies, ordering and review of radiographic studies, pulse oximetry and re-evaluation of patient's condition. Patient with sepsis and hypotension, patient with hyperkalemia that requires admission and dialysis  Medications Ordered in ED Medications  sodium chloride 0.9 % bolus 1,000 mL (1,000 mLs Intravenous New Bag/Given 08-02-17 0617)    And  sodium chloride 0.9 % bolus 1,000 mL (0 mLs Intravenous Stopped 08-02-2017 0736)    And  sodium chloride 0.9 % bolus 1,000 mL (0 mLs Intravenous Stopped 02-Aug-2017 0734)    And  sodium chloride 0.9 % bolus 1,000 mL (not administered)  vancomycin (VANCOCIN) 2,000 mg in sodium chloride 0.9 % 500 mL IVPB (2,000 mg Intravenous New Bag/Given 08-02-2017 0659)  furosemide (LASIX) injection 40 mg (not administered)  dextrose 50 % solution 50 mL (not administered)  insulin aspart (novoLOG) injection 5 Units (not administered)  aztreonam (AZACTAM) 2 g in dextrose 5 % 50 mL IVPB (0 g Intravenous Stopped 08-02-17 0700)     Initial Impression / Assessment and Plan / ED Course  I have reviewed the triage vital signs and the nursing notes.  Pertinent labs & imaging results that were available during my care of the patient were reviewed by me and considered in my medical decision making (see chart for details).     6:05 AM Patient with chest pain, cough. Chest x-ray consistent with pneumonia, this is healthcare associated pneumonia He is hypotensive with elevated lactate, will enact code sepsis 8:06 AM Patient persistently hypotensive, also noted to be hyperkalemic and reports he has not been dialyzed since December 22. I have consulted nephrology, Dr. Jimmy Footman and  aware patient will need for dialysis I have consulted critical care due to the fact the patient is persistently hypotensive in the setting of sepsis and will also need dialysis today Discussed With Dr. Nelda Marseille for admission to ICU  Emergent treatment of hyperkalemia ordered Patient updated on plan Final Clinical Impressions(s) / ED Diagnoses   Final diagnoses:  Sepsis, due to unspecified organism [facility])  ESRD (end stage renal disease) ([place])  HCAP (healthcare-associated pneumonia)    ED Discharge Orders    None       Ripley Fraise, MD 2017/08/02 [phone]

## 2017-07-28 NOTE — Consult Note (Addendum)
Renal Service Consult Note [facility]  Steven [name] 2017/07/24 Steven [name] Requesting Physician:  Dr Lonny Prude, Chauncey Cruel.   Reason for Consult:  ESRD pt with hypotension HPI: The patient is a 84 y.o. year-old with hx of ESRD, afib, urinary retention w/ indwell foley, chronic hypotension who presented to ED with c/o chest pain and cough.  ED eval showed hypotension, ^lactic acid and possible PNA on CXR.  BP's in the ED are in the 60's - 70's range.  He has a DNR order in the [place] room.  Asked to see for ESRD  Last admit was 11/30 - 07/03/17 for afib/ RVR and hypotension, FTT, gen'd weakness, ESRD, vol overload.  Vol removal was limited due to hypotension.  He was told that he might not be able to continue dialysis in the future if BP's were too low.  He acknowledged and said he would be interested in a hospice referral if he couldn't get dialysis.    He is having some CP and cough today.  Some mild SOB, not severe.  Sig swelling of legs.    His wife and only son are both dead, his son died age 58.    ROS  no joint pain   no HA  no blurry vision  no rash  no diarrhea  no nausea/ vomiting     Past Medical History  Past Medical History:  Diagnosis Date  . Anemia   . Atrial fibrillation ([place])   . BPH (benign prostatic hyperplasia)   . CKD (chronic kidney disease), stage IV ([place])   . Gout   . HX: long term anticoagulant use   . Hypercalcemia   . Hypertension   . Kidney stone   . Mitral valve disorders(424.0)   . Osteoarthritis   . Prostate CA ([place])   . PVC (premature ventricular contraction)   . Vitamin D deficiency    Past Surgical History  Past Surgical History:  Procedure Laterality Date  . CARDIOVERSION N/A 12/11/2016   Procedure: CARDIOVERSION;  Surgeon: Josue Hector, MD;  Location: [facility] ENDOSCOPY;  Service: Cardiovascular;  Laterality: N/A;  . EYE SURGERY Bilateral    cataract extraction with IOL  . JOINT REPLACEMENT Left 2012   knee  . KIDNEY STONE  SURGERY    . SP AV DIALYSIS SHUNT ACCESS EXISTING *L* Left    no dialysis  . TEE WITHOUT CARDIOVERSION N/A 12/11/2016   Procedure: TRANSESOPHAGEAL ECHOCARDIOGRAM (TEE);  Surgeon: Josue Hector, MD;  Location[facility] ENDOSCOPY;  Service: Cardiovascular;  Laterality: N/A;  . TOTAL KNEE ARTHROPLASTY Right 06/06/2013   Procedure: RIGHT TOTAL KNEE ARTHROPLASTY;  Surgeon: Mauri Pole, MD;  Location: [facility] ORS;  Service: Orthopedics;  Laterality: Right;   Family History No family history on file. Social History  reports that he is a non-smoker but has been exposed to tobacco smoke. He has quit using smokeless tobacco. He reports that he drinks alcohol. He reports that he does not use drugs. Allergies  Allergies  Allergen Reactions  . Hydrocodone Other (See Comments)    Reaction:  Confusion   . Methocarbamol Other (See Comments)    Reported by [facility] 03/06/17 - unknown reaction  . Penicillins Hives and Other (See Comments)    Has patient had a PCN reaction causing immediate rash, facial/tongue/throat swelling, SOB or lightheadedness with hypotension: No Has patient had a PCN reaction causing severe rash involving mucus membranes or skin necrosis: No Has patient had a PCN reaction that required  hospitalization No Has patient had a PCN reaction occurring within the last 10 years: No If all of the above answers are "NO", then may proceed with Cephalosporin use.   Home medications Prior to Admission medications   Medication Sig Start Date End Date Taking? Authorizing Provider  acetaminophen (TYLENOL) 500 MG tablet Take 1 tablet (500 mg total) by mouth every 6 (six) hours as needed for mild pain, moderate pain, fever or headache. 07/04/17   Arrien, Jimmy Picket, MD  allopurinol (ZYLOPRIM) 100 MG tablet Take 100 mg by mouth daily.     [provider]  amiodarone (PACERONE) 200 MG tablet Take 200 mg by mouth daily.    [provider]  atorvastatin (LIPITOR) 10 MG tablet Take  10 mg by mouth daily after supper.     [provider]  diltiazem (CARDIZEM CD) 240 MG 24 hr capsule Take 1 capsule (240 mg total) by mouth daily. Patient taking differently: Take 240 mg by mouth See admin instructions. Take 1 capsule (240 mg) by mouth on Sunday, Tuesday, Thursday, Saturday morning (non-dialysis days) 06/14/13   [name], PA-C  furosemide (LASIX) 40 MG tablet Take 40 mg by mouth See admin instructions. Take 1 tablet (40 mg) by mouth on Tuesday, Thursday, Saturday mornings    [provider]  levothyroxine (SYNTHROID, LEVOTHROID) 50 MCG tablet Take 50 mcg by mouth daily.     [provider]  medroxyPROGESTERone (PROVERA) 5 MG tablet Take 5 mg by mouth 2 (two) times daily.     [provider]  midodrine (PROAMATINE) 10 MG tablet Take 1 tablet (10 mg total) by mouth 3 (three) times daily. 07/04/17 08/03/17  Arrien, Jimmy Picket, MD  mirtazapine (REMERON) 30 MG tablet Take 30 mg by mouth daily after supper.     [provider]  multivitamin (RENA-VIT) TABS tablet Take 1 tablet by mouth daily.     [provider]  Nutritional Supplements (FEEDING SUPPLEMENT, NEPRO CARB STEADY,) LIQD Take 237 mLs by mouth 2 (two) times daily between meals. 07/04/17 08/03/17  Arrien, Jimmy Picket, MD  sevelamer carbonate (RENVELA) 800 MG tablet Take 800 mg by mouth daily with breakfast.     [provider]  tamsulosin (FLOMAX) 0.4 MG CAPS capsule Take 0.4 mg by mouth daily.    [provider]  vitamin B-12 (CYANOCOBALAMIN) 500 MCG tablet Take 500 mcg by mouth daily after supper.    [provider]  warfarin (COUMADIN) 4 MG tablet Take 2 mg by mouth daily after supper.     [provider]   Liver Function Tests No results for input(s): AST, ALT, ALKPHOS, BILITOT, PROT, ALBUMIN in the last 168 hours. No results for input(s): LIPASE, AMYLASE in the last 168 hours. CBC Recent Labs  Lab 2017-07-26 0535  07-26-2017 0701  WBC 9.9  --   HGB 10.7* 11.9*  HCT 32.3* 35.0*  MCV 93.4  --   PLT 149*  --    Basic Metabolic Panel Recent Labs  Lab July 26, 2017 0535 07/26/17 0701  NA 129* 129*  K 6.1* 6.1*  CL 91* 95*  CO2 17*  --   GLUCOSE 92 75  BUN 47* 57*  CREATININE 8.16* 8.60*  CALCIUM 8.0*  --    Iron/TIBC/Ferritin/ %Sat    Component Value Date/Time   IRON 49 03/31/2011 2255   TIBC 276 03/31/2011 2255   FERRITIN 320 03/31/2011 2255   IRONPCTSAT 18 (L) 03/31/2011 2255    Vitals:   July 26, 2017 0800 07-26-2017  0830 2017-08-08 0915 Aug 08, 2017 0930  BP: (!) 150/135 95/77 (!) 64/43 (!) 148/125  Pulse: (!) 121 (!) 116 (!) 122 (!) 102  Resp: (!) 28 (!) 30 (!) 29 (!) 29  Temp:      TempSrc:      SpO2: 99% 99% 99% 100%  Weight:      Height:       Exam Gen elderly WM, BP 60's syst in ED, alert and interacts approp No rash, cyanosis or gangrene Sclera anicteric, throat clear  No jvd or bruits Chest some basilar rales bilat RRR no MRG Abd soft ntnd no mass or ascites +bs, +abd wall pitting edema GU normal male MS no joint effusions or deformity Ext 1-2+ pitting depend hip edema bilat, no wounds or ulcers Neuro is alert, Ox 3 , nf LUA AVF+bruit   Dialysis: MWF [facility]   Impression: 1. Hypotension - pt in shock , acute on chronic, possible sepsis/ PNA.   2. Chest pain 3. ESRD on HD MWF 4. Chronic hypotension 5. Vol overload - anasarca on exam 6. AFib    Plan - pt too sick for continued dialysis, this was discussed with him on recent admit.  Have d/w patient who understands and accepts the situation.  Pall care consulted.  He has IV morphine ordered for pain/ resp discomfort.    [name] MD [employer] pager [phone]   08/08/2017, 10:00 AM

## 2017-07-28 NOTE — Progress Notes (Signed)
Nurse checked on patient status at 1205.  Patient noted to have agonal breathing with lapses between breaths.  Nurse sat at bedside and held patients hand.  Patient took 2 more breaths.  Time of death 11-13-07.  Verified by Samantha Crimes, RN and Court Joy, RN.

## 2017-07-28 NOTE — H&P (Signed)
History and Physical    [name]:[id number] DOB: 02-24-1927 DOA: 08/22/2017   PCP: Orpah Melter, MD   Attending physician: Lonny Prude  Patient coming from/Resides with: SNF  Chief Complaint: Chest pain  HPI: Steven [name] is a 84 y.o. male with medical history significant for chronic kidney disease on dialysis, autonomic dysfunction with chronic hypotension with systolic blood pressures typically in the 80-90 range despite midodrine, atrial fibrillation, hypothyroidism, depression, urinary retention requiring chronic Foley.  Patient was last discharged on 12/8 after an admission for atrial fibrillation with RVR.  He was able to maintain rate control with a combination of calcium channel blocker and amiodarone.  Because of recurrent hypotension his beta-blocker was discontinued and his midodrine dosage was increased.  He typically dialyzes on MWF ultrafiltration has been limited recently by recurrent hypotension.  He reported prior to most recent discharge that in the event he was unable to dialyze successfully he would be interested in a hospice referral.  He was continued on Lasix and sevelamer at time of discharge.  Patient returned to the ER today from [facility] rehabilitation reporting 1 hours worth of chest pain that was worse with coughing.  He was found to be in A. fib on the monitor.  Initial presenting BP was 79/43, he was slightly tachycardic at 118, he was hypothermic with a rectal temperature of 96.4 and he was mildly tachypneic with respiratory rates between 22 and 30 breaths/min.  He had been empirically placed on 2 L nasal cannula was maintaining saturations of 97%.  His initial lactic acid was 3.34, poc troponin was 0.18, white count was normal, differential not obtained.  Because he is on Coumadin and INR was checked and this was elevated at 6.34.  Rest x-ray revealed moderate left pleural effusion and associated bibasilar opacity which may represent pneumonia.   Acute right lung base opacity could represent atelectasis or pneumonia.  Influenza PCR was negative.  Because of his presenting symptomatology critical care medicine was consulted but given patient's current DNR status and recent declaration that if he were unable to tolerate dialysis he would wish hospice care there were no urgent critical care needs identified.  EDP reported that the patient's nephrologist also spoke at length with the patient and told him he was no longer a candidate to dialysis.  Patient agreed and states that "I am ready to die, I am ready to see myeloid and have been."  He will be admitted for comfort measures.  ED Course: Vital Signs: BP (!) 148/125   Pulse (!) 102   Temp (!) 96.4 F (35.8 C) (Rectal)   Resp (!) 29   Ht 5\' 8"  (1.727 m)   Wt 126.1 kg (278 lb)   SpO2 100%   BMI 42.27 kg/m  PCXR: As above Lab data: Sodium 129, potassium 6.1, chloride 95, glucose 75, BUN 57, creatinine 8.6, lactic acid as above with repeat 3.13 and 2.91, hemoglobin 11.9, hematocrit 35, white count 9900, platelets 149,000, INR 6.34, PT 55.5.  Influenza a/B PCR negative.  Blood cultures were obtained in the ER since patient presented with sepsis physiology Medications and treatments: Aztreonam 2 g IV x1, normal saline bolus times 2 L, vancomycin 2 g IV x1, Lasix 40 mg IV x1, D50 x1 amp, NovoLog insulin 5 units IV x1  Review of Systems:  In addition to the HPI above,  No Fever-chills, myalgias or other constitutional symptoms No Headache, changes with Vision or hearing, new weakness, tingling, numbness in  any extremity, dizziness, dysarthria or word finding difficulty, gait disturbance or imbalance, tremors or seizure activity No problems swallowing food or Liquids, indigestion/reflux, choking or coughing while eating, abdominal pain with or after eating No palpitations, orthopnea or DOE No Abdominal pain, N/V, melena,hematochezia, dark tarry stools, constipation No dysuria, malodorous urine,  hematuria or flank pain No new skin rashes, lesions, masses or bruises, No new joint pains, aches, swelling or redness No recent unintentional weight gain or loss No polyuria, polydypsia or polyphagia   Past Medical History:  Diagnosis Date  . Anemia   . Atrial fibrillation ([place])   . BPH (benign prostatic hyperplasia)   . CKD (chronic kidney disease), stage IV ([place])   . Gout   . HX: long term anticoagulant use   . Hypercalcemia   . Hypertension   . Kidney stone   . Mitral valve disorders(424.0)   . Osteoarthritis   . Prostate CA ([place])   . PVC (premature ventricular contraction)   . Vitamin D deficiency     Past Surgical History:  Procedure Laterality Date  . CARDIOVERSION N/A 12/11/2016   Procedure: CARDIOVERSION;  Surgeon: Josue Hector, MD;  Location: [facility] ENDOSCOPY;  Service: Cardiovascular;  Laterality: N/A;  . EYE SURGERY Bilateral    cataract extraction with IOL  . JOINT REPLACEMENT Left 2012   knee  . KIDNEY STONE SURGERY    . SP AV DIALYSIS SHUNT ACCESS EXISTING *L* Left    no dialysis  . TEE WITHOUT CARDIOVERSION N/A 12/11/2016   Procedure: TRANSESOPHAGEAL ECHOCARDIOGRAM (TEE);  Surgeon: Josue Hector, MD;  Location: [facility] ENDOSCOPY;  Service: Cardiovascular;  Laterality: N/A;  . TOTAL KNEE ARTHROPLASTY Right 06/06/2013   Procedure: RIGHT TOTAL KNEE ARTHROPLASTY;  Surgeon: Mauri Pole, MD;  Location: [facility] ORS;  Service: Orthopedics;  Laterality: Right;    Social History   Socioeconomic History  . Marital status: Widowed    Spouse name: Not on file  . Number of children: Not on file  . Years of education: Not on file  . Highest education level: Not on file  Social Needs  . Financial resource strain: Not on file  . Food insecurity - worry: Not on file  . Food insecurity - inability: Not on file  . Transportation needs - medical: Not on file  . Transportation needs - non-medical: Not on file  Occupational History  . Not on file  Tobacco Use  . Smoking  status: Passive Smoke Exposure - Never Smoker  . Smokeless tobacco: Former Network engineer and Sexual Activity  . Alcohol use: Yes    Comment: attended [employer]  . Drug use: No  . Sexual activity: No  Other Topics Concern  . 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  States no family nearby    Mobility: Admission PT evaluation buildup patient was moderate assist x2 for sit and stand and ambulation of about 8 feet.  Recommended daily PT at facility.  Quite assistance and rolling walker for mobilization and recommended out of bed to recliner at least 3 times daily Work history: Not obtained   Allergies  Allergen Reactions  . Hydrocodone Other (See Comments)    Reaction:  Confusion   . Methocarbamol Other (See Comments)    Reported by [facility] 03/06/17 - unknown reaction  . Penicillins Hives and Other (See Comments)    Has patient had a PCN reaction causing immediate rash, facial/tongue/throat  swelling, SOB or lightheadedness with hypotension: No Has patient had a PCN reaction causing severe rash involving mucus membranes or skin necrosis: No Has patient had a PCN reaction that required hospitalization No Has patient had a PCN reaction occurring within the last 10 years: No If all of the above answers are "NO", then may proceed with Cephalosporin use.     Family history reviewed and not pertinent current admission which will be for end-of-life care  Prior to Admission medications   Medication Sig Start Date End Date Taking? Authorizing Provider  acetaminophen (TYLENOL) 500 MG tablet Take 1 tablet (500 mg total) by mouth every 6 (six) hours as needed for mild pain, moderate pain, fever or headache. 07/04/17   Arrien, Jimmy Picket, MD  allopurinol (ZYLOPRIM) 100 MG tablet Take 100 mg by mouth daily.     [provider]  amiodarone (PACERONE) 200 MG tablet Take 200 mg by mouth daily.    [provider]    atorvastatin (LIPITOR) 10 MG tablet Take 10 mg by mouth daily after supper.     [provider]  diltiazem (CARDIZEM CD) 240 MG 24 hr capsule Take 1 capsule (240 mg total) by mouth daily. Patient taking differently: Take 240 mg by mouth See admin instructions. Take 1 capsule (240 mg) by mouth on Sunday, Tuesday, Thursday, Saturday morning (non-dialysis days) 06/14/13   [name], PA-C  furosemide (LASIX) 40 MG tablet Take 40 mg by mouth See admin instructions. Take 1 tablet (40 mg) by mouth on Tuesday, Thursday, Saturday mornings    [provider]  levothyroxine (SYNTHROID, LEVOTHROID) 50 MCG tablet Take 50 mcg by mouth daily.     [provider]  medroxyPROGESTERone (PROVERA) 5 MG tablet Take 5 mg by mouth 2 (two) times daily.     [provider]  midodrine (PROAMATINE) 10 MG tablet Take 1 tablet (10 mg total) by mouth 3 (three) times daily. 07/04/17 08/03/17  Arrien, Jimmy Picket, MD  mirtazapine (REMERON) 30 MG tablet Take 30 mg by mouth daily after supper.     [provider]  multivitamin (RENA-VIT) TABS tablet Take 1 tablet by mouth daily.     [provider]  Nutritional Supplements (FEEDING SUPPLEMENT, NEPRO CARB STEADY,) LIQD Take 237 mLs by mouth 2 (two) times daily between meals. 07/04/17 08/03/17  Arrien, Jimmy Picket, MD  sevelamer carbonate (RENVELA) 800 MG tablet Take 800 mg by mouth daily with breakfast.     [provider]  tamsulosin (FLOMAX) 0.4 MG CAPS capsule Take 0.4 mg by mouth daily.    [provider]  vitamin B-12 (CYANOCOBALAMIN) 500 MCG tablet Take 500 mcg by mouth daily after supper.    [provider]  warfarin (COUMADIN) 4 MG tablet Take 2 mg by mouth daily after supper.     [provider]    Physical Exam: Vitals:   08/04/17 0800 08/04/2017 0830 08/04/2017 0915 08-04-17 0930  BP: (!) 150/135 95/77 (!) 64/43 (!) 148/125  Pulse: (!) 121 (!) 116 (!) 122 (!) 102  Resp: (!)  28 (!) 30 (!) 29 (!) 29  Temp:      TempSrc:      SpO2: 99% 99% 99% 100%  Weight:      Height:          Constitutional: NAD, calm, comfortable-reports to me that he is not hungry and he is ready to die Eyes: PERRL, lids and conjunctivae normal ENMT: Mucous membranes are dry. Posterior pharynx clear of  any exudate or lesions. Poor dentition.  Neck: normal, supple, no masses, no thyromegaly Respiratory: clear to auscultation bilaterally, no wheezing, no crackles-decreased in bases bilaterally. Normal respiratory effort. No accessory muscle use.  Cardiovascular: Irregular rate and rhythm, no murmurs / rubs / gallops.  1-2+ bilateral lower extremity edema. 2+ pedal pulses. No carotid bruits.  Hypotensive Abdomen: no tenderness, no masses palpated. No hepatosplenomegaly. Bowel sounds positive.  Genitourinary: Chronic Foley catheter in place Musculoskeletal: no clubbing / cyanosis. No joint deformity upper and lower extremities. Good ROM, no contractures. Normal muscle tone.  Skin: no rashes, lesions, ulcers. No induration-skin appears somewhat yellow diffusely Neurologic: CN 2-12 grossly intact. Sensation intact, DTR normal. Strength 5/5 x all 4 extremities.  Psychiatric: Normal judgment and insight. Alert and oriented x 3. Normal mood.    Labs on Admission: I have personally reviewed following labs and imaging studies  CBC: Recent Labs  Lab 21-Aug-2017 0535 2017-08-21 0701  WBC 9.9  --   HGB 10.7* 11.9*  HCT 32.3* 35.0*  MCV 93.4  --   PLT 149*  --    Basic Metabolic Panel: Recent Labs  Lab 08/21/17 0535 2017/08/21 0701  NA 129* 129*  K 6.1* 6.1*  CL 91* 95*  CO2 17*  --   GLUCOSE 92 75  BUN 47* 57*  CREATININE 8.16* 8.60*  CALCIUM 8.0*  --    GFR: Estimated Creatinine Clearance: 7.7 mL/min (A) (by C-G formula based on SCr of 8.6 mg/dL (H)). Liver Function Tests: No results for input(s): AST, ALT, ALKPHOS, BILITOT, PROT, ALBUMIN in the last 168 hours. No results for  input(s): LIPASE, AMYLASE in the last 168 hours. No results for input(s): AMMONIA in the last 168 hours. Coagulation Profile: Recent Labs  Lab 08/21/17 0752  INR 6.34*   Cardiac Enzymes: No results for input(s): CKTOTAL, CKMB, CKMBINDEX, TROPONINI in the last 168 hours. BNP (last 3 results) No results for input(s): PROBNP in the last 8760 hours. HbA1C: No results for input(s): HGBA1C in the last 72 hours. CBG: No results for input(s): GLUCAP in the last 168 hours. Lipid Profile: No results for input(s): CHOL, HDL, LDLCALC, TRIG, CHOLHDL, LDLDIRECT in the last 72 hours. Thyroid Function Tests: No results for input(s): TSH, T4TOTAL, FREET4, T3FREE, THYROIDAB in the last 72 hours. Anemia Panel: No results for input(s): VITAMINB12, FOLATE, FERRITIN, TIBC, IRON, RETICCTPCT in the last 72 hours. Urine analysis:    Component Value Date/Time   COLORURINE AMBER (A) 06/26/2017 1623   APPEARANCEUR TURBID (A) 06/26/2017 1623   LABSPEC 1.019 06/26/2017 1623   PHURINE 7.0 06/26/2017 1623   GLUCOSEU NEGATIVE 06/26/2017 1623   HGBUR NEGATIVE 06/26/2017 1623   BILIRUBINUR NEGATIVE 06/26/2017 1623   KETONESUR NEGATIVE 06/26/2017 1623   PROTEINUR >=300 (A) 06/26/2017 1623   UROBILINOGEN 0.2 06/01/2013 1316   NITRITE NEGATIVE 06/26/2017 1623   LEUKOCYTESUR MODERATE (A) 06/26/2017 1623   Sepsis Labs: @LABRCNTIP (procalcitonin:4,lacticidven:4) )No results found for this or any previous visit (from the past 240 hour(s)).   Radiological Exams on Admission: Dg Chest Portable 1 View  Result Date: 08/21/17 CLINICAL DATA:  84 y/o M; shortness of breath, pneumonia, chest pain, hypotension. EXAM: PORTABLE CHEST 1 VIEW COMPARISON:  06/26/2017 chest radiograph FINDINGS: Stable enlarged cardiac silhouette. Aortic atherosclerosis with calcification. Moderate left pleural effusion and left basilar opacity. Streaky opacity of right lung base. No acute osseous abnormality is evident. IMPRESSION: Moderate  left pleural effusion and associated basilar opacity which may represent pneumonia. Streaky right lung base opacity may  represent atelectasis or pneumonia. Electronically Signed   By: Kristine Garbe M.D.   On: Aug 11, 2017 06:00    EKG: (Independently reviewed) EKG with indeterminate rhythm knowing significantly widened QRS-this is more pronounced than previous EKG 07/17/2023 at which although demonstrated a widened QRS QRS complexes were clearly delineated-current rhythm appears more consistent with possible diffuse ST segment elevation/idiopathic ventricular rhythm  Assessment/Plan Principal Problem:   Sepsis due to pneumonia  -Presents with chest pain and found to have bilateral pneumonia with moderate size left effusion -Based on previous determined wishes patient again clarifies that he does not want any aggressive measures and that he is ready to die therefore end of life order set initiated with comfort measures -Unfortunately patient lives in a nursing home and his wife and son have preceded him in death; he does have friends but nursing and EDP staff unable to contact any friends (including his power of attorney) while in ER -Formal palliative consultation requested -DNR -Will begin IV morphine every hour prn- can transition to infusion if requires multiple frequent dosing regimen **palliative care reviewed medications and given high risk of myoclonus with the administration of morphine in renal patients this medication was changed to Dilaudid by the palliative care team -Anticipate hospital death given current hypotension, sepsis physiology, and desire to discontinue dialysis -Continue preadmission Foley catheter -Continue preadmission amiodarone for rate control as comfort measure and as long as patient alert enough to tolerate -Regular diet  Active Problems:   CKD (chronic kidney disease) stage V requiring chronic dialysis  -Per EDP report, nephrology has discussed with  patient and he is no longer a candidate for hemodialysis -Patient agreed and is now comfort measures as above      DVT prophylaxis: Comfort measures Code Status: DNR Family Communication: No friends at bedside Disposition Plan: TBD but I anticipate hospital death Consults called: PCCM/[place]; Nephrology/[place]; Palliative/[place]   Samella Parr ANP-BC [facility] Pager [phone]   If 7PM-7AM, please contact night-coverage [email or web address] Password [facility]  Aug 11, 2017, 9:58 AM

## 2017-07-28 NOTE — Progress Notes (Signed)
Palliative Medicine RN Note: Rec'd a call from [facility]. Pt has expired. I called friend BJ Lipford [phone]; she reports pt has a policy at [employer]. She will not be coming to visit again (she came earlier, after his move to [facility]). Notified RN on [facility] of funeral home choice.  Marjie Skiff Fredrick Dray, RN, BSN, [facility] Aug 01, 2017 12:31 PM Office [phone]

## 2017-07-28 NOTE — ED Notes (Signed)
CCM notified of pt's BP-- will see.

## 2017-07-28 NOTE — Progress Notes (Signed)
Patient arrived to [facility] from ED for comfort care.  He is A&Ox4, B/P very low, 2 IV sites intact.  Noted to have skin tear present on sacrum, graft on left upper arm for dialysis access, and unstageable wound on right heel.  Palliative care nurse Threasa Beards present on arrival.  Will continue to monitor.

## 2017-07-28 NOTE — Progress Notes (Signed)
Palliative Medicine RN Note:  Present when pt got dilaudid. He remained awake without full relief of symptoms. Obtained rx to schedule ativan and increase frequency of dilaudid prn.  Marjie Skiff Sansa Alkema, RN, BSN, [facility] 2017-07-31 10:54 AM Office [phone]

## 2017-07-28 DEATH — deceased

## 2017-08-28 DEATH — deceased

## 2018-07-13 IMAGING — CT CT HEAD W/O CM
3 of 4 series · 15 of 47 positions shown, 18 images · non-contrast
Comparison: 03/31/2011

CLINICAL DATA: Generalize weakness for 1 month.  Status post fall.

EXAM:
CT HEAD WITHOUT CONTRAST
TECHNIQUE: Contiguous axial images were obtained from the base of the skull
through the vertex without intravenous contrast.

[Series 3: head 5.0 h30s · axial · 0.42mm/px · z∈[-92,+53]mm · 9 of 33 slices shown, 12 images]
[im 2/33  brain]
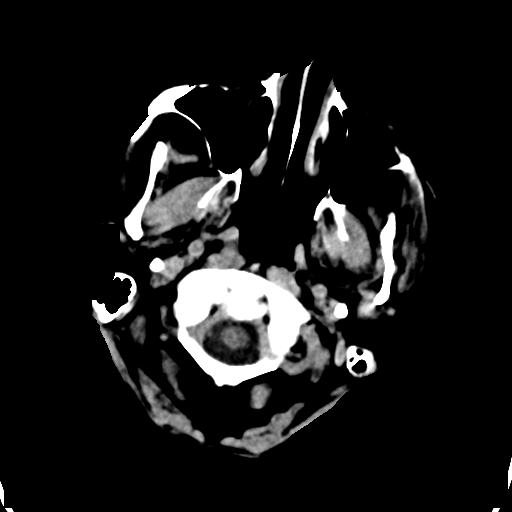
[im 2/33  bone]
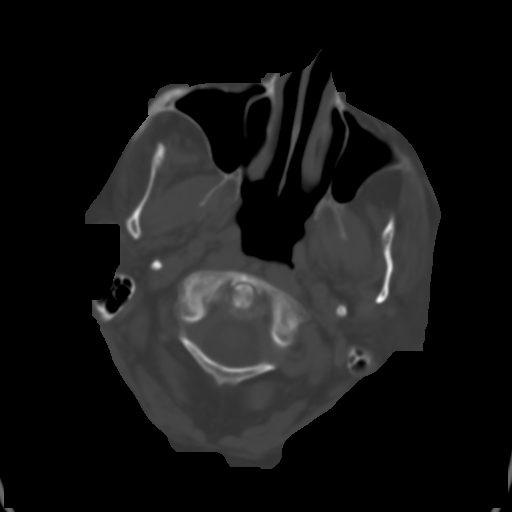
[im 6/33  brain]
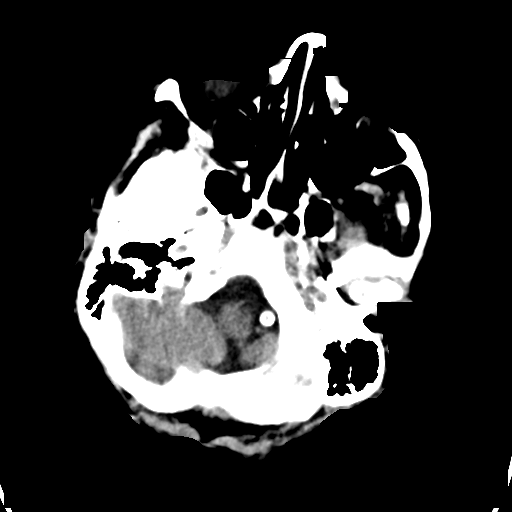
[im 9/33  brain]
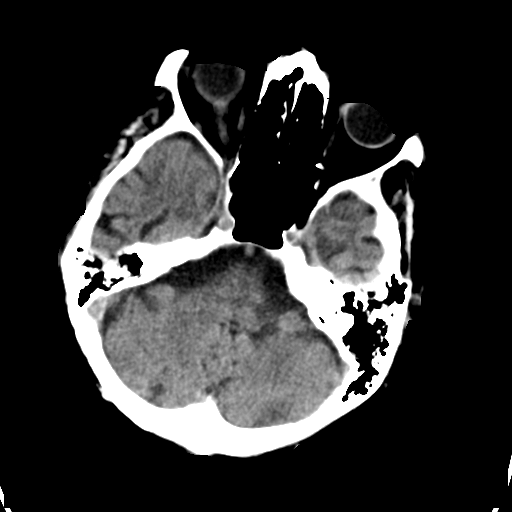
[im 13/33  brain]
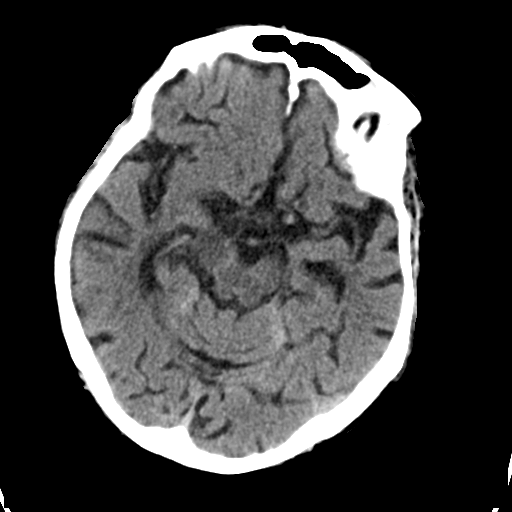
[im 17/33  brain]
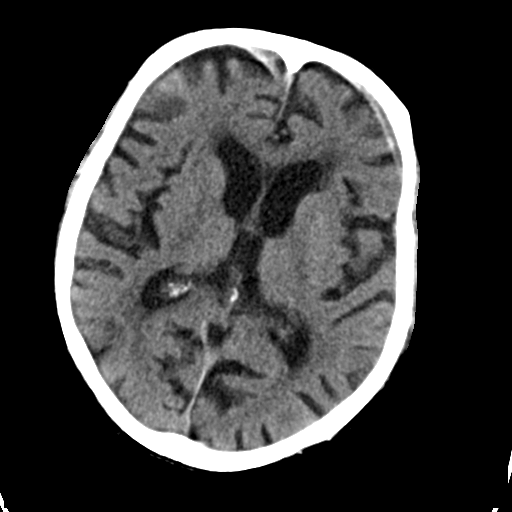
[im 17/33  bone]
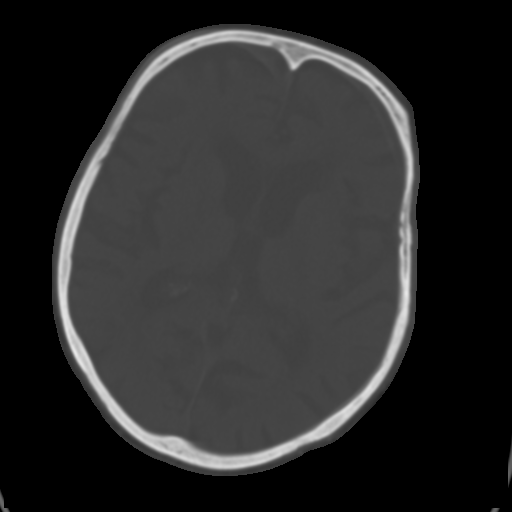
[im 20/33  brain]
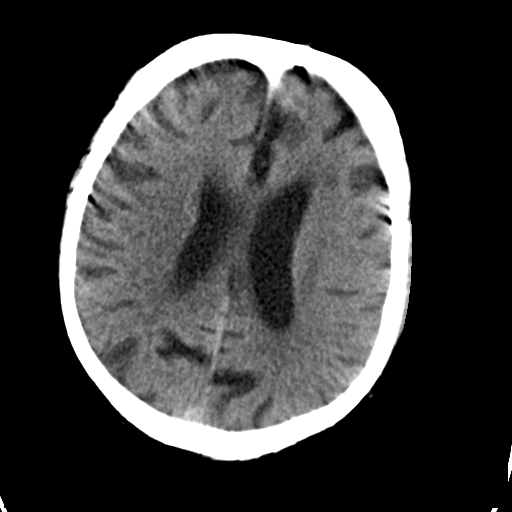
[im 24/33  brain]
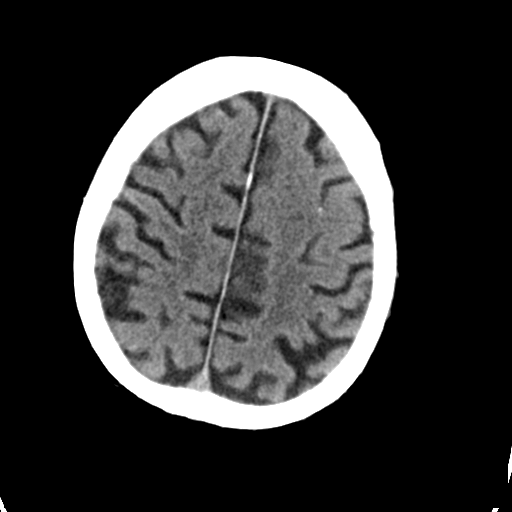
[im 27/33  brain]
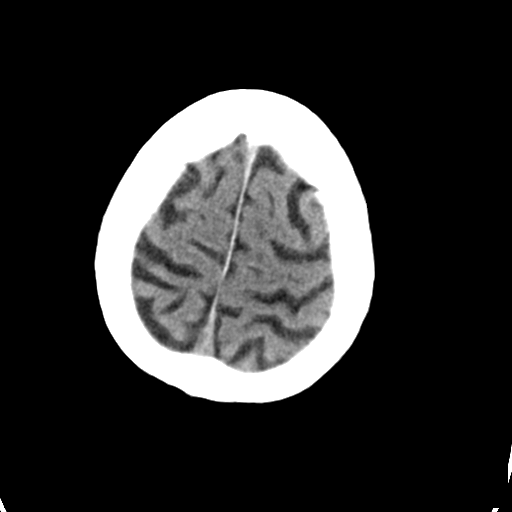
[im 31/33  brain]
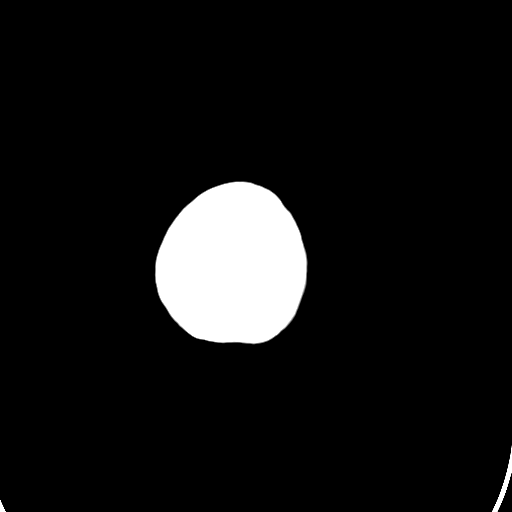
[im 31/33  bone]
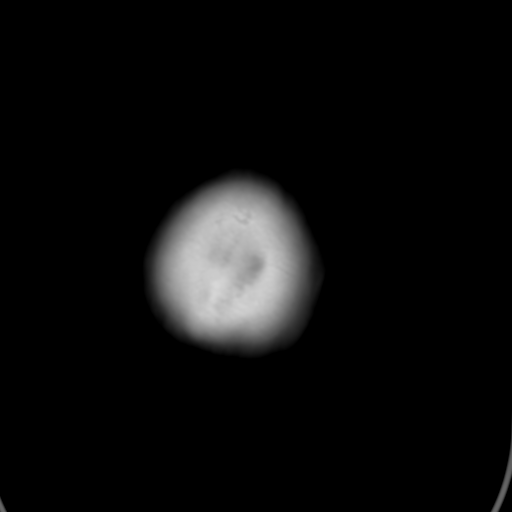

[Series 5: head 3.0 mpr cor · coronal · 0.32mm/px · 3 of 68 slices shown]
[im 23/68  brain]
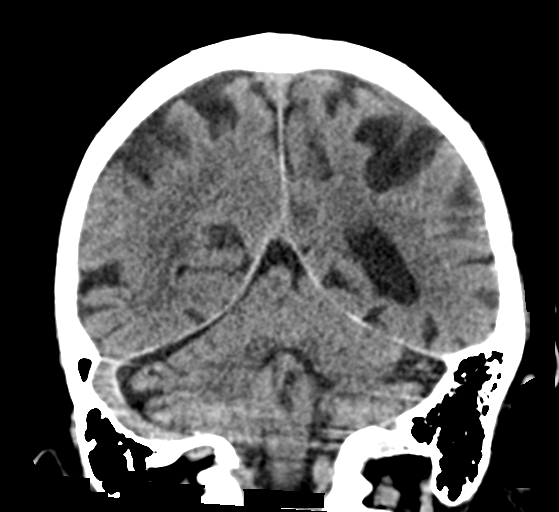
[im 30/68  brain]
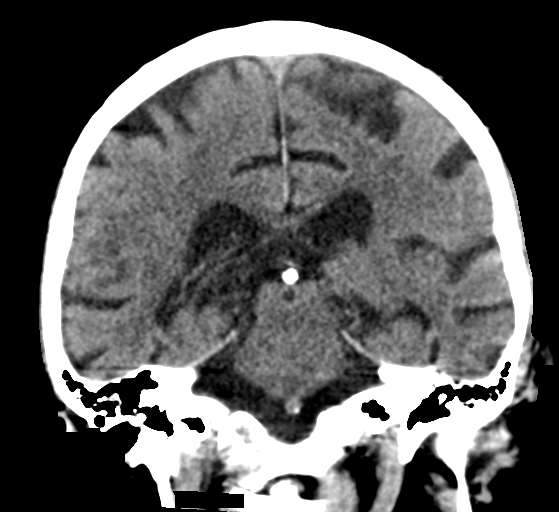
[im 38/68  brain]
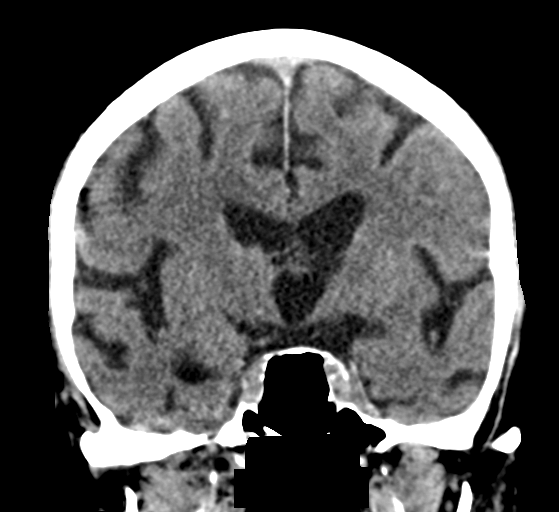

[Series 6: head 3.0 mpr sag · sagittal · 0.32mm/px · 3 of 57 slices shown]
[im 19/57  brain]
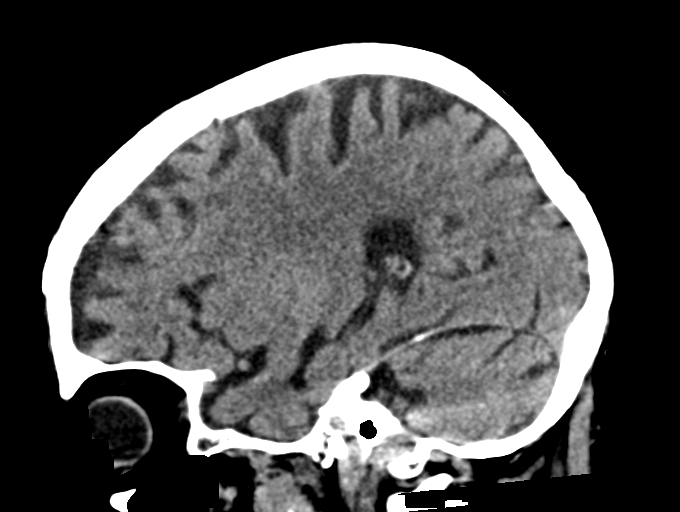
[im 29/57  brain]
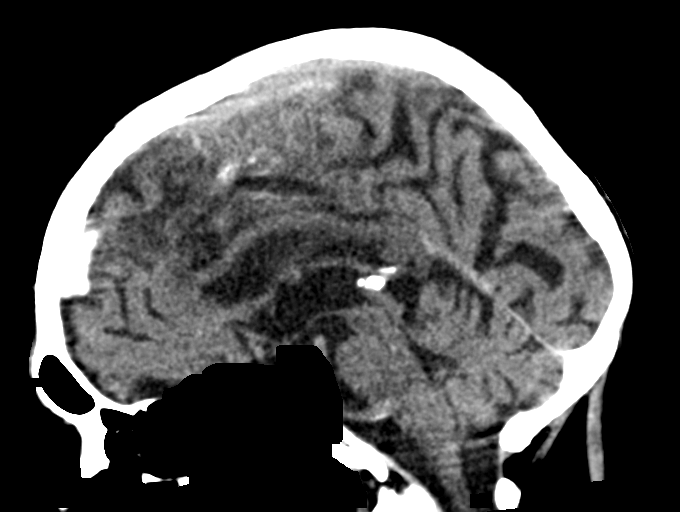
[im 38/57  brain]
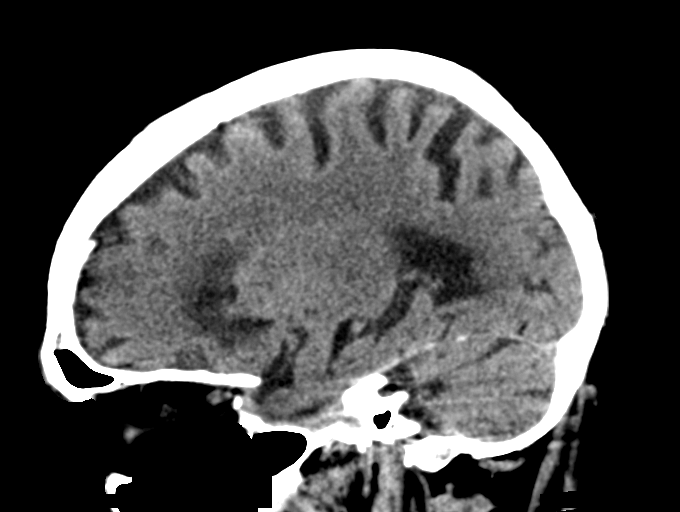

[15 of 47 positions shown; findings below may reference images not displayed]

FINDINGS: Brain: No evidence of acute infarction, hemorrhage, extra-axial
collection, ventriculomegaly, or mass effect. Old left basal ganglia
lacunar infarct. Generalized cerebral atrophy. Periventricular white
matter low attenuation likely secondary to microangiopathy.

Vascular: Cerebrovascular atherosclerotic calcifications are noted.

Skull: Negative for fracture or focal lesion.

Sinuses/Orbits: Visualized portions of the orbits are unremarkable.
Visualized portions of the paranasal sinuses and mastoid air cells
are unremarkable.

Other: None.
IMPRESSION: 1. No acute intracranial pathology.
2. Chronic microvascular disease and cerebral atrophy.
